# Patient Record
Sex: Male | Born: 1955 | Race: Black or African American | Hispanic: No | Marital: Married | Smoking: Never smoker
Health system: Southern US, Community
[De-identification: ages and names within clinical notes are randomized; demographics above are authoritative.]

## PROBLEM LIST (undated history)

## (undated) DIAGNOSIS — K219 Gastro-esophageal reflux disease without esophagitis: Secondary | ICD-10-CM

## (undated) DIAGNOSIS — T7840XA Allergy, unspecified, initial encounter: Secondary | ICD-10-CM

## (undated) DIAGNOSIS — F419 Anxiety disorder, unspecified: Secondary | ICD-10-CM

## (undated) DIAGNOSIS — N138 Other obstructive and reflux uropathy: Secondary | ICD-10-CM

## (undated) DIAGNOSIS — M545 Low back pain, unspecified: Secondary | ICD-10-CM

## (undated) DIAGNOSIS — R6882 Decreased libido: Secondary | ICD-10-CM

## (undated) DIAGNOSIS — F0390 Unspecified dementia without behavioral disturbance: Secondary | ICD-10-CM

## (undated) DIAGNOSIS — H5462 Unqualified visual loss, left eye, normal vision right eye: Secondary | ICD-10-CM

## (undated) DIAGNOSIS — N401 Enlarged prostate with lower urinary tract symptoms: Secondary | ICD-10-CM

## (undated) DIAGNOSIS — Z8701 Personal history of pneumonia (recurrent): Secondary | ICD-10-CM

## (undated) DIAGNOSIS — I10 Essential (primary) hypertension: Secondary | ICD-10-CM

## (undated) DIAGNOSIS — N529 Male erectile dysfunction, unspecified: Secondary | ICD-10-CM

## (undated) HISTORY — DX: Male erectile dysfunction, unspecified: N52.9

## (undated) HISTORY — DX: Decreased libido: R68.82

## (undated) HISTORY — DX: Unspecified dementia, unspecified severity, without behavioral disturbance, psychotic disturbance, mood disturbance, and anxiety: F03.90

## (undated) HISTORY — DX: Benign prostatic hyperplasia with lower urinary tract symptoms: N40.1

## (undated) HISTORY — DX: Personal history of pneumonia (recurrent): Z87.01

## (undated) HISTORY — DX: Allergy, unspecified, initial encounter: T78.40XA

## (undated) HISTORY — DX: Other obstructive and reflux uropathy: N13.8

## (undated) HISTORY — DX: Low back pain, unspecified: M54.50

## (undated) HISTORY — DX: Unqualified visual loss, left eye, normal vision right eye: H54.62

## (undated) HISTORY — DX: Essential (primary) hypertension: I10

## (undated) HISTORY — PX: JOINT REPLACEMENT: SHX530

## (undated) HISTORY — DX: Low back pain: M54.5

---

## 2004-07-06 ENCOUNTER — Ambulatory Visit: Payer: Self-pay

## 2008-11-04 ENCOUNTER — Ambulatory Visit: Payer: Self-pay | Admitting: Otolaryngology

## 2009-05-13 ENCOUNTER — Ambulatory Visit: Payer: Self-pay | Admitting: Family Medicine

## 2011-04-12 ENCOUNTER — Ambulatory Visit: Payer: Self-pay

## 2011-10-09 ENCOUNTER — Ambulatory Visit: Payer: Self-pay | Admitting: Family Medicine

## 2011-12-22 ENCOUNTER — Ambulatory Visit: Payer: Self-pay | Admitting: Family Medicine

## 2012-11-04 ENCOUNTER — Ambulatory Visit: Payer: Self-pay | Admitting: Family Medicine

## 2013-09-24 ENCOUNTER — Ambulatory Visit (INDEPENDENT_AMBULATORY_CARE_PROVIDER_SITE_OTHER): Payer: BC Managed Care – PPO

## 2013-09-24 ENCOUNTER — Ambulatory Visit (INDEPENDENT_AMBULATORY_CARE_PROVIDER_SITE_OTHER): Payer: BC Managed Care – PPO | Admitting: Podiatry

## 2013-09-24 ENCOUNTER — Encounter: Payer: Self-pay | Admitting: Podiatry

## 2013-09-24 VITALS — BP 140/90 | HR 60 | Resp 16 | Ht 72.0 in | Wt 192.0 lb

## 2013-09-24 DIAGNOSIS — M722 Plantar fascial fibromatosis: Secondary | ICD-10-CM

## 2013-09-24 DIAGNOSIS — Q828 Other specified congenital malformations of skin: Secondary | ICD-10-CM

## 2013-09-24 NOTE — Progress Notes (Signed)
Right plantar heel pain for several months, it started out in the hip.  Objective: Vital signs are stable he is alert and oriented x3. Pulses to the right foot are intact. He has pain on palpation medial continued tubercle of the right heel. He also has pain on direct palpation of the porokeratotic lesion centrally located in the plantar aspect of the right heel. Does not demonstrate any erythema edema cellulitis drainage or odor no signs of infection.  Assessment: Plantar fasciitis right foot. Porokeratosis plantar aspect right foot.  Plan: Injected the right heel today with Kenalog and local anesthetic. Debridement porokeratosis and applied salicylic acid under occlusion for 3 days. He was given instructions to washes off in 3 days and I will followup with him in one month

## 2013-09-24 NOTE — Patient Instructions (Signed)
Plantar Fasciitis (Heel Spur Syndrome) with Rehab The plantar fascia is a fibrous, ligament-like, soft-tissue structure that spans the bottom of the foot. Plantar fasciitis is a condition that causes pain in the foot due to inflammation of the tissue. SYMPTOMS   Pain and tenderness on the underneath side of the foot.  Pain that worsens with standing or walking. CAUSES  Plantar fasciitis is caused by irritation and injury to the plantar fascia on the underneath side of the foot. Common mechanisms of injury include:  Direct trauma to bottom of the foot.  Damage to a small nerve that runs under the foot where the main fascia attaches to the heel bone.  Stress placed on the plantar fascia due to bone spurs. RISK INCREASES WITH:   Activities that place stress on the plantar fascia (running, jumping, pivoting, or cutting).  Poor strength and flexibility.  Improperly fitted shoes.  Tight calf muscles.  Flat feet.  Failure to warm-up properly before activity.  Obesity. PREVENTION  Warm up and stretch properly before activity.  Allow for adequate recovery between workouts.  Maintain physical fitness:  Strength, flexibility, and endurance.  Cardiovascular fitness.  Maintain a health body weight.  Avoid stress on the plantar fascia.  Wear properly fitted shoes, including arch supports for individuals who have flat feet. PROGNOSIS  If treated properly, then the symptoms of plantar fasciitis usually resolve without surgery. However, occasionally surgery is necessary. RELATED COMPLICATIONS   Recurrent symptoms that may result in a chronic condition.  Problems of the lower back that are caused by compensating for the injury, such as limping.  Pain or weakness of the foot during push-off following surgery.  Chronic inflammation, scarring, and partial or complete fascia tear, occurring more often from repeated injections. TREATMENT  Treatment initially involves the use of  ice and medication to help reduce pain and inflammation. The use of strengthening and stretching exercises may help reduce pain with activity, especially stretches of the Achilles tendon. These exercises may be performed at home or with a therapist. Your caregiver may recommend that you use heel cups of arch supports to help reduce stress on the plantar fascia. Occasionally, corticosteroid injections are given to reduce inflammation. If symptoms persist for greater than 6 months despite non-surgical (conservative), then surgery may be recommended.  MEDICATION   If pain medication is necessary, then nonsteroidal anti-inflammatory medications, such as aspirin and ibuprofen, or other minor pain relievers, such as acetaminophen, are often recommended.  Do not take pain medication within 7 days before surgery.  Prescription pain relievers may be given if deemed necessary by your caregiver. Use only as directed and only as much as you need.  Corticosteroid injections may be given by your caregiver. These injections should be reserved for the most serious cases, because they may only be given a certain number of times. HEAT AND COLD  Cold treatment (icing) relieves pain and reduces inflammation. Cold treatment should be applied for 10 to 15 minutes every 2 to 3 hours for inflammation and pain and immediately after any activity that aggravates your symptoms. Use ice packs or massage the area with a piece of ice (ice massage).  Heat treatment may be used prior to performing the stretching and strengthening activities prescribed by your caregiver, physical therapist, or athletic trainer. Use a heat pack or soak the injury in warm water. SEEK IMMEDIATE MEDICAL CARE IF:  Treatment seems to offer no benefit, or the condition worsens.  Any medications produce adverse side effects. EXERCISES RANGE   OF MOTION (ROM) AND STRETCHING EXERCISES - Plantar Fasciitis (Heel Spur Syndrome) These exercises may help you  when beginning to rehabilitate your injury. Your symptoms may resolve with or without further involvement from your physician, physical therapist or athletic trainer. While completing these exercises, remember:   Restoring tissue flexibility helps normal motion to return to the joints. This allows healthier, less painful movement and activity.  An effective stretch should be held for at least 30 seconds.  A stretch should never be painful. You should only feel a gentle lengthening or release in the stretched tissue. RANGE OF MOTION - Toe Extension, Flexion  Sit with your right / left leg crossed over your opposite knee.  Grasp your toes and gently pull them back toward the top of your foot. You should feel a stretch on the bottom of your toes and/or foot.  Hold this stretch for __________ seconds.  Now, gently pull your toes toward the bottom of your foot. You should feel a stretch on the top of your toes and or foot.  Hold this stretch for __________ seconds. Repeat __________ times. Complete this stretch __________ times per day.  RANGE OF MOTION - Ankle Dorsiflexion, Active Assisted  Remove shoes and sit on a chair that is preferably not on a carpeted surface.  Place right / left foot under knee. Extend your opposite leg for support.  Keeping your heel down, slide your right / left foot back toward the chair until you feel a stretch at your ankle or calf. If you do not feel a stretch, slide your bottom forward to the edge of the chair, while still keeping your heel down.  Hold this stretch for __________ seconds. Repeat __________ times. Complete this stretch __________ times per day.  STRETCH - Gastroc, Standing  Place hands on wall.  Extend right / left leg, keeping the front knee somewhat bent.  Slightly point your toes inward on your back foot.  Keeping your right / left heel on the floor and your knee straight, shift your weight toward the wall, not allowing your back to  arch.  You should feel a gentle stretch in the right / left calf. Hold this position for __________ seconds. Repeat __________ times. Complete this stretch __________ times per day. STRETCH - Soleus, Standing  Place hands on wall.  Extend right / left leg, keeping the other knee somewhat bent.  Slightly point your toes inward on your back foot.  Keep your right / left heel on the floor, bend your back knee, and slightly shift your weight over the back leg so that you feel a gentle stretch deep in your back calf.  Hold this position for __________ seconds. Repeat __________ times. Complete this stretch __________ times per day. STRETCH - Gastrocsoleus, Standing  Note: This exercise can place a lot of stress on your foot and ankle. Please complete this exercise only if specifically instructed by your caregiver.   Place the ball of your right / left foot on a step, keeping your other foot firmly on the same step.  Hold on to the wall or a rail for balance.  Slowly lift your other foot, allowing your body weight to press your heel down over the edge of the step.  You should feel a stretch in your right / left calf.  Hold this position for __________ seconds.  Repeat this exercise with a slight bend in your right / left knee. Repeat __________ times. Complete this stretch __________ times per day.    STRENGTHENING EXERCISES - Plantar Fasciitis (Heel Spur Syndrome)  These exercises may help you when beginning to rehabilitate your injury. They may resolve your symptoms with or without further involvement from your physician, physical therapist or athletic trainer. While completing these exercises, remember:   Muscles can gain both the endurance and the strength needed for everyday activities through controlled exercises.  Complete these exercises as instructed by your physician, physical therapist or athletic trainer. Progress the resistance and repetitions only as guided. STRENGTH -  Towel Curls  Sit in a chair positioned on a non-carpeted surface.  Place your foot on a towel, keeping your heel on the floor.  Pull the towel toward your heel by only curling your toes. Keep your heel on the floor.  If instructed by your physician, physical therapist or athletic trainer, add ____________________ at the end of the towel. Repeat __________ times. Complete this exercise __________ times per day. STRENGTH - Ankle Inversion  Secure one end of a rubber exercise band/tubing to a fixed object (table, pole). Loop the other end around your foot just before your toes.  Place your fists between your knees. This will focus your strengthening at your ankle.  Slowly, pull your big toe up and in, making sure the band/tubing is positioned to resist the entire motion.  Hold this position for __________ seconds.  Have your muscles resist the band/tubing as it slowly pulls your foot back to the starting position. Repeat __________ times. Complete this exercises __________ times per day.  Document Released: 02/27/2005 Document Revised: 05/22/2011 Document Reviewed: 06/11/2008 [employer] Patient Information 2015 [employer]. This information is not intended to replace advice given to you by your health care provider. Make sure you discuss any questions you have with your health care provider.  

## 2013-10-22 ENCOUNTER — Ambulatory Visit: Payer: BC Managed Care – PPO | Admitting: Podiatry

## 2013-12-01 ENCOUNTER — Ambulatory Visit: Payer: Self-pay | Admitting: Specialist

## 2013-12-01 LAB — CBC
HCT: 42.7 % (ref 40.0–52.0)
HGB: 13.7 g/dL (ref 13.0–18.0)
MCH: 30 pg (ref 26.0–34.0)
MCHC: 32.2 g/dL (ref 32.0–36.0)
MCV: 93 fL (ref 80–100)
PLATELETS: 215 10*3/uL (ref 150–440)
RBC: 4.58 10*6/uL (ref 4.40–5.90)
RDW: 13.4 % (ref 11.5–14.5)
WBC: 7.2 10*3/uL (ref 3.8–10.6)

## 2013-12-01 LAB — URINALYSIS, COMPLETE
BACTERIA: NONE SEEN
BLOOD: NEGATIVE
Bilirubin,UR: NEGATIVE
Glucose,UR: NEGATIVE mg/dL (ref 0–75)
KETONE: NEGATIVE
Leukocyte Esterase: NEGATIVE
NITRITE: NEGATIVE
Ph: 7 (ref 4.5–8.0)
Protein: NEGATIVE
RBC,UR: 1 /HPF (ref 0–5)
SQUAMOUS EPITHELIAL: NONE SEEN
Specific Gravity: 1.005 (ref 1.003–1.030)
WBC UR: NONE SEEN /HPF (ref 0–5)

## 2013-12-01 LAB — BASIC METABOLIC PANEL
ANION GAP: 8 (ref 7–16)
BUN: 14 mg/dL (ref 7–18)
CHLORIDE: 105 mmol/L (ref 98–107)
CREATININE: 0.84 mg/dL (ref 0.60–1.30)
Calcium, Total: 8.6 mg/dL (ref 8.5–10.1)
Co2: 26 mmol/L (ref 21–32)
Glucose: 89 mg/dL (ref 65–99)
Osmolality: 277 (ref 275–301)
Potassium: 4 mmol/L (ref 3.5–5.1)
SODIUM: 139 mmol/L (ref 136–145)

## 2013-12-01 LAB — MRSA PCR SCREENING

## 2013-12-01 LAB — PROTIME-INR
INR: 1
PROTHROMBIN TIME: 12.6 s (ref 11.5–14.7)

## 2013-12-17 ENCOUNTER — Inpatient Hospital Stay: Payer: Self-pay | Admitting: Specialist

## 2013-12-18 LAB — BASIC METABOLIC PANEL
ANION GAP: 4 — AB (ref 7–16)
BUN: 9 mg/dL (ref 7–18)
CHLORIDE: 104 mmol/L (ref 98–107)
Calcium, Total: 7.8 mg/dL — ABNORMAL LOW (ref 8.5–10.1)
Co2: 30 mmol/L (ref 21–32)
Creatinine: 0.85 mg/dL (ref 0.60–1.30)
EGFR (African American): >60
EGFR (Non-African Amer.): >60
Glucose: 94 mg/dL (ref 65–99)
Osmolality: 274 (ref 275–301)
POTASSIUM: 4.2 mmol/L (ref 3.5–5.1)
Sodium: 138 mmol/L (ref 136–145)

## 2013-12-18 LAB — CBC WITH DIFFERENTIAL/PLATELET
Basophil #: 0 10*3/uL (ref 0.0–0.1)
Basophil %: 0.2 %
EOS PCT: 3.5 %
Eosinophil #: 0.4 10*3/uL (ref 0.0–0.7)
HCT: 37.8 % — ABNORMAL LOW (ref 40.0–52.0)
HGB: 12.1 g/dL — ABNORMAL LOW (ref 13.0–18.0)
Lymphocyte #: 1.7 10*3/uL (ref 1.0–3.6)
Lymphocyte %: 15.9 %
MCH: 30.1 pg (ref 26.0–34.0)
MCHC: 32 g/dL (ref 32.0–36.0)
MCV: 94 fL (ref 80–100)
Monocyte #: 0.9 x10 3/mm (ref 0.2–1.0)
Monocyte %: 8.6 %
NEUTROS PCT: 71.8 %
Neutrophil #: 7.5 10*3/uL — ABNORMAL HIGH (ref 1.4–6.5)
Platelet: 167 10*3/uL (ref 150–440)
RBC: 4.03 10*6/uL — ABNORMAL LOW (ref 4.40–5.90)
RDW: 13.5 % (ref 11.5–14.5)
WBC: 10.5 10*3/uL (ref 3.8–10.6)

## 2013-12-19 LAB — PATHOLOGY REPORT

## 2013-12-19 LAB — HEMOGLOBIN: HGB: 11.7 g/dL — ABNORMAL LOW (ref 13.0–18.0)

## 2014-07-04 NOTE — Op Note (Signed)
PATIENT NAME:  Eric Rose, Eric Rose MR#:  [id number] DATE OF BIRTH:  [birth year]  DATE OF PROCEDURE:  12/17/2013  PREOPERATIVE DIAGNOSIS: Advanced osteoarthritis, right hip.   POSTOPERATIVE DIAGNOSIS: Advanced osteoarthritis, right hip.   PROCEDURE PERFORMED: Cementless AML right total hip replacement (18 mm femoral stem, 58 mm series 300 cup, 32 mm liner with a +10 mm build-up, 32 mm head/+5 mm neck length).   SURGEON: Park Breed, MD    ANESTHESIA: Spinal.   COMPLICATIONS: None.   DRAINS: Two Autovac drains.   ESTIMATED BLOOD LOSS: 100 mL; replaced none.   DESCRIPTION OF PROCEDURE: The patient was brought to the operating room where he underwent satisfactory spinal anesthesia and was placed in the left lateral decubitus position and padded appropriately. The right hip was prepped and draped in a sterile fashion, and the posterolateral incision made. Dissection was carried out sharply through the subcutaneous tissue and fascia. The Charnley retractor was inserted. The short external rotators were divided and tagged, and the capsule was opened in a "T" fashion. Electrocautery was used for hemostasis.   The femoral head was dislocated and amputated with an oscillating saw. The retractors were inserted and the labrum was excised. The femoral head was severely arthritic, as well as acetabulum. The acetabulum was reamed sequentially from 52 mm to 57 mm. A trial acetabular cup liner was inserted and fit very snugly; this was removed, and a series 300, 58 mm acetabular component was inserted in about 45 degrees of abduction and 25 degrees of anteversion. The trial liner was inserted with a 10 mm build-up posterosuperiorly. The femoral canal was then opened and sequentially reamed to 17.5 mm. A rasper sequentially inserted up to 18 mm, which was quite snug. The calcar was reamed smooth. Trial reduction was carried out with a +1,  36 mm head and a +1.5 mm neck length. This was reduced and showed good  length and good  stability.   The trials were removed and a hole eliminator placed in the acetabular component. A 58 mm cup for a 32 mm head was inserted with a 10 mm build-up posterosuperiorly. The 18 mm AML standard stem was inserted and was quite tight. A trial reduction was carried out, and initially a 36 ball was used. This was changed to a 32 mm ball, and a +5 neck length seemed to provide the best stability and showed good range of motion with excellent stability and leg lengths. The trial head was removed and a 32 mm, +5 head was inserted. This was reduced and showed good length and excellent stability.   The wound was then irrigated. The capsule was closed with #1 Tycron suture. The short external rotators were repaired with #1 Tycron. The Autovac were inserted. The fascia was closed with some #2 Quill, and the subcutaneous tissue was closed over another Autovac drain was 0 Quill. The skin was closed with staples.   TENS pads and an Aquacel dressing were applied. The drain was taped in place and activated. The patient was transferred to his hospital bed. The hip was stable. Leg lengths were excellent. He was transferred to the recovery room in good condition.    ____________________________ Park Breed, MD hem:MT D: 12/17/2013 10:43:07 ET T: 12/17/2013 11:37:06 ET JOB#: [id number]  cc: Park Breed, MD, <Dictator> Park Breed MD ELECTRONICALLY SIGNED 12/18/2013 12:46

## 2014-07-04 NOTE — H&P (Signed)
   Subjective/Chief Complaint Right hip pain   History of Present Illness 59 year old male presents with servere right hip pain due to osteoarthritis. He has had extensive treatment with NSAIDs, exercise and rest without relirf.  He finds that he cannot do his job or perforn any daily activities without significant pain.  He wishes to proceed with right total hip replacement.  Risks and benefits of surgery were discussed at length including but not limited to infection, non union, nerve or blood vessed damage, non union, need for repeat surgery, blood clots and lung emboli, and death. X-rays show advanced osteoarthritis with complete loss of joint space, sclerosis, and multiple cysts.   Primary Physician [facility]   Code Status Full Code   Past Med/Surgical Hx:  hypertention:   ALLERGIES:  No Known Allergies:   HOME MEDICATIONS: Medication Instructions Status  aspirin 81 mg oral tablet 1 tab(s) orally once a day Active  Ultram 50 mg oral tablet 1 tab(s) orally every 6 hours, As Needed Active  Voltaren Topical 1% topical gel Apply topically to affected area 3 times a day Active  lisinopril 20 mg oral tablet 1 tab(s) orally once a day Active  indomethacin 50 mg oral capsule 1 cap(s) orally 3 times a day Active  multivitamin 1 tab(s) orally once a day Active  Glucosamine & Chondroitin with MSM 400 mg-500 mg-250 mg oral tablet 1 tab(s) orally once a day, As Needed Active  vitamin d 1000 milligram(s) orally once a day Active  Fish Oil 300 milligram(s) orally once a day Active  Tylenol 500 mg oral tablet 1 tab(s) orally 1 to 3 times a day, As Needed Active   Family and Social History:  Family History Non-Contributory   Social History negative tobacco   Place of Living Home   Review of Systems:  Fever/Chills No   Cough No   Sputum No   Abdominal Pain No   Diarrhea No   Chest Pain No   Physical Exam:  GEN well developed, well nourished, no acute distress   HEENT pink  conjunctivae   NECK supple   RESP normal resp effort   CARD regular rate   ABD denies tenderness   LYMPH negative neck   EXTR negative edema, Pain with range of motion.  internal rotation -5* and Ext rotation 20*.  Flexion 80*.  circulation/sensation/motor function good.  Leg lengths equal.   SKIN normal to palpation   NEURO motor/sensory function intact   PSYCH alert, A+O to time, place, person, good insight    Assessment/Admission Diagnosis Advanced osteoarthritis right hip   Plan Right total hip replacement   Electronic Signatures: Park Breed (MD)  (Signed 06-Oct-15 14:56)  Authored: CHIEF COMPLAINT and HISTORY, PAST MEDICAL/SURGIAL HISTORY, ALLERGIES, HOME MEDICATIONS, FAMILY AND SOCIAL HISTORY, REVIEW OF SYSTEMS, PHYSICAL EXAM, ASSESSMENT AND PLAN   Last Updated: 06-Oct-15 14:56 by Park Breed (MD)

## 2014-07-04 NOTE — Discharge Summary (Signed)
PATIENT NAME:  Eric Rose, Eric Rose MR#:  [id number] DATE OF BIRTH:  01-29-1956  DATE OF ADMISSION:  12/17/2013 DATE OF DISCHARGE:    FINAL DIAGNOSES:   1.  Advanced osteoarthritis right hip.   2.  Hypertension.   OPERATION: 12/17/2013 DePuy AML cementless right total hip replacement.   COMPLICATIONS: None.   CONSULTATIONS: None.   DISCHARGE MEDICATIONS:  1. Enteric-coated aspirin 1 p.o. b.i.d. for 6 weeks.   2. Mobic 15 mg daily.   3. Neurontin 400 mg b.i.d.   4. Norco 5/325 q. 4-6 h. p.r.n. pain.  5. Home medications as prior to admission.   HISTORY: The patient is a 58 year old male who has had progressive arthritis of the right hip for many years. He has been treated with nonsteroidal anti-inflammatory drugs, rest, and exercise without relief. X-rays revealed complete loss of joint space with severe sclerosis and multiple cyst formation present. The patient was admitted for right total hip replacement. The risks and benefits were discussed with him at length.   PAST MEDICAL HISTORY: Illnesses as above.   MEDICATIONS:  Lisinopril and 81 mg aspirin.    REVIEW OF SYSTEMS: Unremarkable.   FAMILY HISTORY: Unremarkable.   ALLERGIES: None.   OPERATIONS: None listed.     FAMILY DOCTOR:   Dr. Ashok Norris.    SOCIAL HISTORY: The patient does not smoke. Lives at home.   REVIEW OF SYSTEMS: Unremarkable.   PHYSICAL EXAMINATION: The patient was alert and cooperative. The right hip showed flexion to 80 degrees, he lacked 5 degrees of neutral in internal rotation and externally rotated to 20 degrees. Leg lengths were essentially equal. He had severe pain with motion of the hip. Neurovascular status was good distally.   Laboratory data on admission was satisfactory.   HOSPITAL COURSE: On 12/17/2013 the patient underwent cementless AML right total hip replacement. Postoperatively he did well. Hemoglobin was 11.7 on the second postoperative day. He was ambulatory with PT and the wound  was benign when the dressing was changed. He is to be discharged 12/20/2013 to home. He will get home PT. He will remain partial weight-bearing on the right hip and return to my office in 2 weeks for exam and x-ray of the right hip. He will remain partial weightbearing and take enteric-coated aspirin b.i.d.    ____________________________ Park Breed, MD hem:bu D: 12/19/2013 13:00:00 ET T: 12/19/2013 14:38:01 ET JOB#: [id number]  cc: Ashok Norris, MD Park Breed, MD, <Dictator>  Park Breed MD ELECTRONICALLY SIGNED 12/19/2013 17:45

## 2014-12-15 ENCOUNTER — Ambulatory Visit (INDEPENDENT_AMBULATORY_CARE_PROVIDER_SITE_OTHER): Payer: BLUE CROSS/BLUE SHIELD | Admitting: Family Medicine

## 2014-12-15 ENCOUNTER — Encounter: Payer: Self-pay | Admitting: Family Medicine

## 2014-12-15 VITALS — BP 130/72 | HR 101 | Temp 98.2°F | Resp 16 | Ht 70.0 in | Wt 193.4 lb

## 2014-12-15 DIAGNOSIS — N528 Other male erectile dysfunction: Secondary | ICD-10-CM

## 2014-12-15 DIAGNOSIS — M159 Polyosteoarthritis, unspecified: Secondary | ICD-10-CM

## 2014-12-15 DIAGNOSIS — N400 Enlarged prostate without lower urinary tract symptoms: Secondary | ICD-10-CM | POA: Diagnosis not present

## 2014-12-15 DIAGNOSIS — M150 Primary generalized (osteo)arthritis: Secondary | ICD-10-CM

## 2014-12-15 DIAGNOSIS — I10 Essential (primary) hypertension: Secondary | ICD-10-CM | POA: Diagnosis not present

## 2014-12-15 MED ORDER — TADALAFIL 20 MG PO TABS: 20.0000 mg | ORAL_TABLET | Freq: Every day | ORAL | Status: DC | PRN

## 2014-12-15 NOTE — Progress Notes (Signed)
Name: Eric Rose   MRN: [id number]    DOB: 14-Nov-1955   Date:12/15/2014       Progress Note  Subjective  Chief Complaint  Chief Complaint  Patient presents with  . Hip Pain  . Hypertension    HPI  Hypertension   Patient presents for follow-up of hypertension. It has been present for over over 5 years.  Patient states that there is compliance with medical regimen which consists of lisinopril 20 mg daily . There is no end organ disease. Cardiac risk factors include hypertension hyperlipidemia and diabetes.  Exercise regimen consist of minimal walking .  Diet consist of some salt restriction .  Erectile dysfunction  Patient has a number of questions about erectile dysfunction. He has used Viagra and Cialis in the past. Is concerned about possible side effects that he has not a cardiac patient is not on nitrates and has had no significant side effects. 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 Prostatitis   . Hypertension   . Lumbago   . Decreased libido   . Allergy     Social History  Substance Use Topics  . Smoking status: Never Smoker   . Smokeless tobacco: Never Used  . Alcohol Use: Not on file     Current outpatient prescriptions:  .  aspirin 81 MG tablet, Take 81 mg by mouth daily., Disp: , Rfl:  .  diclofenac sodium (VOLTAREN) 1 % GEL, Apply topically 3 (three) times daily., Disp: , Rfl:  .  lisinopril (PRINIVIL,ZESTRIL) 20 MG tablet, Take 20 mg by mouth daily., Disp: , Rfl:  .  ciprofloxacin (CIPRO) 500 MG tablet, Take 500 mg by mouth 2 (two) times daily., Disp: , Rfl:  .  tadalafil (CIALIS) 20 MG tablet, Take 1 tablet (20 mg total) by mouth daily as needed for erectile dysfunction., Disp: 10 tablet, Rfl: 11  No Known  Allergies  Review of Systems  Constitutional: Negative for fever, chills and weight loss.  HENT: Negative for congestion, hearing loss, sore throat and tinnitus.   Eyes: Negative for blurred vision, double vision and redness.  Respiratory: Negative for cough, hemoptysis and shortness of breath.   Cardiovascular: Negative for chest pain, palpitations, orthopnea, claudication and leg swelling.  Gastrointestinal: Negative for heartburn, nausea, vomiting, diarrhea, constipation and blood in stool.  Genitourinary: Positive for urgency and frequency. Negative for dysuria and hematuria.       Erectile dysfunction  Musculoskeletal: Positive for joint pain. Negative for myalgias, back pain, falls and neck pain.  Skin: Negative for itching.  Neurological: Negative for dizziness, tingling, tremors, focal weakness, seizures, loss of consciousness, weakness and headaches.  Endo/Heme/Allergies: Does not bruise/bleed easily.  Psychiatric/Behavioral: Negative for depression and substance abuse. The patient is nervous/anxious. The patient does not have insomnia.      Objective  Filed Vitals:   12/15/14 1501  BP: 130/72  Pulse: 101  Temp: 98.2 F (36.8 C)  Resp: 16  Height: 5\' 10"  (1.778 m)  Weight: 193 lb 7 oz (87.743 kg)  SpO2: 95%     Physical Exam  Constitutional: He is oriented to person, place, and time and well-developed, well-nourished, and in no distress.  HENT:  Head: Normocephalic.  Eyes: EOM are normal. Pupils are equal, round,  and reactive to light.  Neck: Normal range of motion. Neck supple. No thyromegaly present.  Cardiovascular: Normal rate, regular rhythm and normal heart sounds.   No murmur heard. Pulmonary/Chest: Effort normal and breath sounds normal. No respiratory distress. He has no wheezes.  Abdominal: Soft. Bowel sounds are normal.  Musculoskeletal: Normal range of motion. He exhibits no edema.  Lymphadenopathy:    He has no cervical adenopathy.  Neurological:  He is alert and oriented to person, place, and time. No cranial nerve deficit. Gait normal. Coordination normal.  Skin: Skin is warm and dry. No rash noted.  Psychiatric: Affect and judgment normal.      Assessment & Plan  1. Essential hypertension Well-controlled - Comprehensive Metabolic Panel (CMET) - TSH  2. Primary osteoarthritis involving multiple joints Continue NSAIDs and recommendations for his orthopedist  3. BPH (benign prostatic hyperplasia) Flomax offered not needed today by his physician - PSA  4. Other male erectile dysfunction Followed Viagra again

## 2014-12-25 ENCOUNTER — Telehealth: Payer: Self-pay | Admitting: Family Medicine

## 2014-12-25 NOTE — Telephone Encounter (Signed)
Lost his lab slip and would like to pick up a new one Monday

## 2014-12-28 NOTE — Telephone Encounter (Signed)
Printed and placed in file cabinet for pick up

## 2014-12-29 NOTE — Telephone Encounter (Signed)
Called to inform patient that lab slip was ready, no response and voice mail not set up.

## 2014-12-30 ENCOUNTER — Telehealth: Payer: Self-pay | Admitting: Emergency Medicine

## 2014-12-30 DIAGNOSIS — R972 Elevated prostate specific antigen [PSA]: Secondary | ICD-10-CM

## 2014-12-30 LAB — COMPREHENSIVE METABOLIC PANEL
A/G RATIO: 1.8 (ref 1.1–2.5)
ALK PHOS: 96 IU/L (ref 39–117)
ALT: 26 IU/L (ref 0–44)
AST: 29 IU/L (ref 0–40)
Albumin: 4.2 g/dL (ref 3.6–4.8)
BUN/Creatinine Ratio: 12 (ref 10–22)
BUN: 11 mg/dL (ref 8–27)
Bilirubin Total: 0.4 mg/dL (ref 0.0–1.2)
CO2: 25 mmol/L (ref 18–29)
Calcium: 9.2 mg/dL (ref 8.6–10.2)
Chloride: 100 mmol/L (ref 97–106)
Creatinine, Ser: 0.9 mg/dL (ref 0.76–1.27)
GFR calc Af Amer: 106 mL/min/{1.73_m2} (ref >59)
GFR calc non Af Amer: 92 mL/min/{1.73_m2} (ref >59)
GLOBULIN, TOTAL: 2.3 g/dL (ref 1.5–4.5)
Glucose: 89 mg/dL (ref 65–99)
Potassium: 4.3 mmol/L (ref 3.5–5.2)
SODIUM: 139 mmol/L (ref 136–144)
Total Protein: 6.5 g/dL (ref 6.0–8.5)

## 2014-12-30 LAB — PSA: Prostate Specific Ag, Serum: 4.8 ng/mL — ABNORMAL HIGH (ref 0.0–4.0)

## 2014-12-30 LAB — TSH: TSH: 2.3 u[IU]/mL (ref 0.450–4.500)

## 2014-12-30 NOTE — Telephone Encounter (Signed)
Patient notified of lab results. Referral sent

## 2015-01-12 ENCOUNTER — Ambulatory Visit (INDEPENDENT_AMBULATORY_CARE_PROVIDER_SITE_OTHER): Payer: BLUE CROSS/BLUE SHIELD | Admitting: Urology

## 2015-01-12 ENCOUNTER — Encounter: Payer: Self-pay | Admitting: Urology

## 2015-01-12 VITALS — BP 145/88 | HR 84 | Resp 16 | Ht 72.0 in | Wt 190.7 lb

## 2015-01-12 DIAGNOSIS — N401 Enlarged prostate with lower urinary tract symptoms: Secondary | ICD-10-CM | POA: Diagnosis not present

## 2015-01-12 DIAGNOSIS — R972 Elevated prostate specific antigen [PSA]: Secondary | ICD-10-CM | POA: Diagnosis not present

## 2015-01-12 DIAGNOSIS — N138 Other obstructive and reflux uropathy: Secondary | ICD-10-CM | POA: Insufficient documentation

## 2015-01-12 MED ORDER — DUTASTERIDE-TAMSULOSIN HCL 0.5-0.4 MG PO CAPS: 1.0000 | ORAL_CAPSULE | Freq: Every day | ORAL | Status: DC

## 2015-01-12 NOTE — Progress Notes (Signed)
01/12/2015 3:44 PM   Eric Rose 1955-10-13 [id number]  Referring provider: Ashok Norris, MD [address] [place], [place] [address]  Chief Complaint  Patient presents with  . Elevated PSA    HPI: Patient is 59 year old African-American male who was referred to [place] by his primary care physician, Dr. Lucita Lora, for an elevated PSA of 4.8 on 12/29/2014.    Patient had not been seen in our office for the last 2 years.  At that time, he was having difficulty with erections and BPH with LUTS.  His PSA at that time was 4.0.  He was started on Jalyn and a follow-up PSA was 2.0.   He has not had another PSA until recently.  He is complaining of urinary frequency and urgency.  This has been worsening over the last year. He denies any dysuria, gross hematuria or suprapubic pain.  He has not had any recent fevers, chills, nausea or vomiting.  He does not have a family history of prostate cancer.  His I PSS score today is 11/3.        IPSS      01/12/15 1500       International Prostate Symptom Score   How often have you had the sensation of not emptying your bladder? Less than half the time     How often have you had to urinate less than every two hours? Less than half the time     How often have you found you stopped and started again several times when you urinated? Less than 1 in 5 times     How often have you found it difficult to postpone urination? About half the time     How often have you had a weak urinary stream? Less than 1 in 5 times     How often have you had to strain to start urination? Not at All     How many times did you typically get up at night to urinate? 2 Times     Total IPSS Score 11     Quality of Life due to urinary symptoms   If you were to spend the rest of your life with your urinary condition just the way it is now how would you feel about that? Mixed        Score:  1-7 Mild 8-19 Moderate 20-35 Severe   PMH: Past Medical  History  Diagnosis Date  . Prostatitis   . Hypertension   . Lumbago   . Decreased libido   . Allergy     Surgical History: No past surgical history on file.  Home Medications:    Medication List       This list is accurate as of: 01/12/15  3:44 PM.  Always use your most recent med list.               aspirin 81 MG tablet  Take 81 mg by mouth daily.     Dutasteride-Tamsulosin HCl 0.5-0.4 MG Caps  Take 1 capsule by mouth daily.     lisinopril 20 MG tablet  Commonly known as:  PRINIVIL,ZESTRIL  Take 20 mg by mouth daily.     tadalafil 20 MG tablet  Commonly known as:  CIALIS  Take 1 tablet (20 mg total) by mouth daily as needed for erectile dysfunction.        Allergies: No Known Allergies  Family History: Family History  Problem Relation Age of Onset  . Diabetes Mother   .  Heart disease Mother   . Alzheimer's disease Mother     Social History:  reports that he has never smoked. He has never used smokeless tobacco. He reports that he does not drink alcohol or use illicit drugs.  ROS: UROLOGY Frequent Urination?: Yes Hard to postpone urination?: Yes Burning/pain with urination?: No Get up at night to urinate?: No Leakage of urine?: No Urine stream starts and stops?: No Trouble starting stream?: No Do you have to strain to urinate?: No Blood in urine?: No Urinary tract infection?: No Sexually transmitted disease?: No Injury to kidneys or bladder?: No Painful intercourse?: No Weak stream?: No Erection problems?: Yes Penile pain?: No  Gastrointestinal Nausea?: No Vomiting?: No Indigestion/heartburn?: No Diarrhea?: No Constipation?: No  Constitutional Fever: No Night sweats?: No Weight loss?: No Fatigue?: No  Skin Skin rash/lesions?: No Itching?: No  Eyes Blurred vision?: No Double vision?: No  Ears/Nose/Throat Sore throat?: No Sinus problems?: Yes  Hematologic/Lymphatic Swollen glands?: No Easy bruising?:  No  Cardiovascular Leg swelling?: No Chest pain?: No  Respiratory Cough?: No Shortness of breath?: Yes  Endocrine Excessive thirst?: No  Musculoskeletal Back pain?: No Joint pain?: No  Neurological Headaches?: No Dizziness?: No  Psychologic Depression?: No Anxiety?: No  Physical Exam: BP 145/88 mmHg  Pulse 84  Resp 16  Ht 6' (1.829 m)  Wt 190 lb 11.2 oz (86.501 kg)  BMI 25.86 kg/m2  Constitutional: Well nourished. Alert and oriented, No acute distress. HEENT: [place] AT, moist mucus membranes. Trachea midline, no masses. Cardiovascular: No clubbing, cyanosis, or edema. Respiratory: Normal respiratory effort, no increased work of breathing. GI: Abdomen is soft, non tender, non distended, no abdominal masses. Liver and spleen not palpable.  No hernias appreciated.  Stool sample for occult testing is not indicated.   GU: No CVA tenderness.  No bladder fullness or masses.  Patient with uncircumcised phallus. Foreskin easily retracted  Urethral meatus is patent.  No penile discharge. No penile lesions or rashes. Scrotum without lesions, cysts, rashes and/or edema.  Testicles are located scrotally bilaterally. No masses are appreciated in the testicles. Left and right epididymis are normal. Rectal: Patient with  normal sphincter tone. Anus and perineum without scarring or rashes. No rectal masses are appreciated. Prostate is approximately 60 grams, no nodules are appreciated. Seminal vesicles are normal. Skin: No rashes, bruises or suspicious lesions. Lymph: No cervical or inguinal adenopathy. Neurologic: Grossly intact, no focal deficits, moving all 4 extremities. Psychiatric: Normal mood and affect.  Laboratory Data: Lab Results  Component Value Date   WBC 10.5 12/18/2013   HGB 11.7* 12/19/2013   HCT 37.8* 12/18/2013   MCV 94 12/18/2013   PLT 167 12/18/2013    Lab Results  Component Value Date   CREATININE 0.90 12/29/2014   PSA History:  4.0 ng/mL on  06/17/2012-started on Jalyn  2.0 ng/mL on 07/22/2012 Lab Results  Component Value Date   PSA 4.8* 12/29/2014      Assessment & Plan:    1. Elevated PSA:    Without Jalyn, the patient's PSA velocity would have been less than .75/year.  His exam noted benign enlargement. He is having moderate obstructive voiding symptoms, so I would like to restart the [facility] and recheck his PSA in 3 months.  - PSA  2. BPH with LUTS:   Patient's IPSS score is 11/3.    His DRE demonstrates benign enlargement.   I will restart the Jalyn.  He will follow up in 3 months for a PSA and an  IPSS score.    Return in about 3 months (around 04/14/2015) for IPSS and PSA.  Zara Council, [facility] [address], [place], [place] [address] [phone]

## 2015-01-13 ENCOUNTER — Telehealth: Payer: Self-pay

## 2015-01-13 DIAGNOSIS — R972 Elevated prostate specific antigen [PSA]: Secondary | ICD-10-CM

## 2015-01-13 LAB — PSA: Prostate Specific Ag, Serum: 3.6 ng/mL (ref 0.0–4.0)

## 2015-01-13 NOTE — Telephone Encounter (Signed)
Spoke with pt in reference to PSA. Pt voiced understanding.  

## 2015-01-13 NOTE — Telephone Encounter (Signed)
-----   Message from Nori Riis, PA-C sent at 01/13/2015  8:35 AM EDT ----- Patient's PSA has reduced.  We will see him in three months for a PSA, IPSS score and PVR.

## 2015-01-18 ENCOUNTER — Telehealth: Payer: Self-pay | Admitting: Urology

## 2015-01-18 DIAGNOSIS — N400 Enlarged prostate without lower urinary tract symptoms: Secondary | ICD-10-CM

## 2015-01-18 NOTE — Telephone Encounter (Signed)
Pt stated he started taking dutastride last week and developed severe indigestion and gas. Pt stated he quit taking medication over the weekend and not has had a problem. Please advise.

## 2015-01-18 NOTE — Telephone Encounter (Signed)
Pt called and has some questions about side effects he's having from medication that [facility] prescribed.  Please give pt a call.

## 2015-01-18 NOTE — Telephone Encounter (Signed)
I prescribed the Jalyn which is tamsulosin and dutasteride.  His indigestion as well as likely due to the tamsulosin.  I would like him just to take dutasteride for 3 months.  Please call and dutasteride 0.5 mg 1 daily to his pharmacy.

## 2015-01-22 MED ORDER — DUTASTERIDE 0.5 MG PO CAPS: 0.5000 mg | ORAL_CAPSULE | Freq: Every day | ORAL | Status: DC

## 2015-01-22 NOTE — Telephone Encounter (Signed)
Medication called into pt pharmacy  

## 2015-03-08 ENCOUNTER — Other Ambulatory Visit: Payer: Self-pay | Admitting: Family Medicine

## 2015-03-31 ENCOUNTER — Encounter
Admission: RE | Admit: 2015-03-31 | Discharge: 2015-03-31 | Disposition: A | Discharge status: Home / Self Care | Payer: BLUE CROSS/BLUE SHIELD | Source: Ambulatory Visit | Attending: Specialist | Admitting: Specialist

## 2015-03-31 DIAGNOSIS — I10 Essential (primary) hypertension: Secondary | ICD-10-CM

## 2015-03-31 DIAGNOSIS — Z01812 Encounter for preprocedural laboratory examination: Secondary | ICD-10-CM | POA: Insufficient documentation

## 2015-03-31 HISTORY — DX: Gastro-esophageal reflux disease without esophagitis: K21.9

## 2015-03-31 HISTORY — DX: Anxiety disorder, unspecified: F41.9

## 2015-03-31 LAB — BASIC METABOLIC PANEL
ANION GAP: 7 (ref 5–15)
BUN: 14 mg/dL (ref 6–20)
CHLORIDE: 104 mmol/L (ref 101–111)
CO2: 26 mmol/L (ref 22–32)
Calcium: 8.8 mg/dL — ABNORMAL LOW (ref 8.9–10.3)
Creatinine, Ser: 0.68 mg/dL (ref 0.61–1.24)
Glucose, Bld: 101 mg/dL — ABNORMAL HIGH (ref 65–99)
POTASSIUM: 4 mmol/L (ref 3.5–5.1)
SODIUM: 137 mmol/L (ref 135–145)

## 2015-03-31 LAB — CBC
HCT: 42 % (ref 40.0–52.0)
HEMOGLOBIN: 13.9 g/dL (ref 13.0–18.0)
MCH: 30.8 pg (ref 26.0–34.0)
MCHC: 33.2 g/dL (ref 32.0–36.0)
MCV: 92.9 fL (ref 80.0–100.0)
Platelets: 223 10*3/uL (ref 150–440)
RBC: 4.52 MIL/uL (ref 4.40–5.90)
RDW: 13.2 % (ref 11.5–14.5)
WBC: 7.4 10*3/uL (ref 3.8–10.6)

## 2015-03-31 LAB — URINALYSIS COMPLETE WITH MICROSCOPIC (ARMC ONLY)
BILIRUBIN URINE: NEGATIVE
Bacteria, UA: NONE SEEN
GLUCOSE, UA: NEGATIVE mg/dL
HGB URINE DIPSTICK: NEGATIVE
KETONES UR: NEGATIVE mg/dL
LEUKOCYTES UA: NEGATIVE
NITRITE: NEGATIVE
PH: 7 (ref 5.0–8.0)
Protein, ur: NEGATIVE mg/dL
SPECIFIC GRAVITY, URINE: 1.006 (ref 1.005–1.030)
Squamous Epithelial / LPF: NONE SEEN

## 2015-03-31 LAB — ABO/RH: ABO/RH(D): A POS

## 2015-03-31 LAB — SURGICAL PCR SCREEN
MRSA, PCR: NEGATIVE
STAPHYLOCOCCUS AUREUS: NEGATIVE

## 2015-03-31 LAB — TYPE AND SCREEN
ABO/RH(D): A POS
Antibody Screen: NEGATIVE

## 2015-03-31 LAB — PROTIME-INR
INR: 1.08
PROTHROMBIN TIME: 14.2 s (ref 11.4–15.0)

## 2015-03-31 NOTE — Patient Instructions (Addendum)
  Your procedure is scheduled on: Wednesday 04/14/2015 Report to Day Surgery. 2ND FLOOR MEDICAL MALL ENTRANCE To find out your arrival time please call [phone] between 1PM - 3PM on Tuesday 04/13/2015.  Remember: Instructions that are not followed completely may result in serious medical risk, up to and including death, or upon the discretion of your surgeon and anesthesiologist your surgery may need to be rescheduled.    __X__ 1. Do not eat food or drink liquids after midnight. No gum chewing or hard candies.     __X__ 2. No Alcohol for 24 hours before or after surgery.   ____ 3. Bring all medications with you on the day of surgery if instructed.    __X__ 4. Notify your doctor if there is any change in your medical condition     (cold, fever, infections).     Do not wear jewelry, make-up, hairpins, clips or nail polish.  Do not wear lotions, powders, or perfumes.   Do not shave 48 hours prior to surgery. Men may shave face and neck.  Do not bring valuables to the hospital.    [facility] is not responsible for any belongings or valuables.               Contacts, dentures or bridgework may not be worn into surgery.  Leave your suitcase in the car. After surgery it may be brought to your room.  For patients admitted to the hospital, discharge time is determined by your                treatment team.   Patients discharged the day of surgery will not be allowed to drive home.   Please read over the following fact sheets that you were given:   MRSA Information SURGICAL SITE INFECTION PREVENTION  __X__ Take these medicines the morning of surgery with A SIP OF WATER:    1. lisinopril  2.   3.   4.  5.  6.  ____ Fleet Enema (as directed)   __X__ Use CHG Soap as directed  ____ Use inhalers on the day of surgery  ____ Stop metformin 2 days prior to surgery    ____ Take 1/2 of usual insulin dose the night before surgery and none on the morning of surgery.   __X__ Stop  Coumadin/Plavix/aspirin on 04/05/2015  __x__ Stop Anti-inflammatories on (stop Diclofenac )   __x__ Stop supplements until after surgery.  Fish oil, Glucosamine  ____ Bring C-Pap to the hospital.

## 2015-04-01 ENCOUNTER — Other Ambulatory Visit: Payer: Self-pay | Admitting: Family Medicine

## 2015-04-08 ENCOUNTER — Other Ambulatory Visit: Payer: BLUE CROSS/BLUE SHIELD

## 2015-04-08 DIAGNOSIS — R972 Elevated prostate specific antigen [PSA]: Secondary | ICD-10-CM

## 2015-04-09 ENCOUNTER — Encounter: Payer: BLUE CROSS/BLUE SHIELD | Admitting: Family Medicine

## 2015-04-09 LAB — PSA: PROSTATE SPECIFIC AG, SERUM: 2.5 ng/mL (ref 0.0–4.0)

## 2015-04-13 NOTE — H&P (Signed)
TOTAL HIP ADMISSION H&P  Patient is admitted for left total hip arthroplasty.  Subjective:  Chief Complaint: left hip pain  HPI: Eric Rose, 60 y.o. male, has a history of pain and functional disability in the left hip(s) due to arthritis and patient has failed non-surgical conservative treatments for greater than 12 weeks to include NSAID's and/or analgesics, flexibility and strengthening excercises and use of assistive devices.  Onset of symptoms was gradual starting 2 years ago with gradually worsening course since that time.The patient noted no past surgery on the left hip(s).  Patient currently rates pain in the left hip at 6 out of 10 with activity. Patient has night pain, worsening of pain with activity and weight bearing, trendelenberg gait, pain that interfers with activities of daily living and pain with passive range of motion. Patient has evidence of subchondral cysts, subchondral sclerosis, periarticular osteophytes and joint space narrowing by imaging studies. This condition presents safety issues increasing the risk of falls.    There is no current active infection.  Patient Active Problem List   Diagnosis Date Noted  . Elevated PSA 01/12/2015  . BPH with obstruction/lower urinary tract symptoms 01/12/2015   Past Medical History  Diagnosis Date  . Prostatitis   . Hypertension   . Lumbago   . Decreased libido   . Allergy   . Pneumonia   . Anxiety   . GERD (gastroesophageal reflux disease)     Past Surgical History  Procedure Laterality Date  . Joint replacement Right     hip    No prescriptions prior to admission   No Known Allergies  Social History  Substance Use Topics  . Smoking status: Never Smoker   . Smokeless tobacco: Never Used  . Alcohol Use: No    Family History  Problem Relation Age of Onset  . Diabetes Mother   . Heart disease Mother   . Alzheimer's disease Mother      Review of Systems  Constitutional: Negative.   HENT: Negative.    Eyes: Negative.   Respiratory: Negative.   Cardiovascular: Negative.   Gastrointestinal: Negative.   Genitourinary: Negative.   Musculoskeletal: Positive for joint pain.  Skin: Negative.   Neurological: Negative.   Endo/Heme/Allergies: Negative.   Psychiatric/Behavioral: Negative.     Objective:  Physical Exam  Constitutional: He is oriented to person, place, and time. He appears well-developed and well-nourished.  HENT:  Head: Normocephalic.  Eyes: Pupils are equal, round, and reactive to light.  Neck: Normal range of motion. Neck supple.  Cardiovascular: Normal rate and regular rhythm.   Respiratory: Effort normal.  GI: Soft.  Musculoskeletal:  Left hip painful with motion.  CSM good distally.  ROM 5* IR and 20* ER.  Flex to 80*.  Leg lengths equal.  Skin intact  Neurological: He is alert and oriented to person, place, and time.  Skin: Skin is warm and dry.  Psychiatric: He has a normal mood and affect. His behavior is normal.    Vital signs in last 24 hours:    Labs:   Estimated body mass index is 25.86 kg/(m^2) as calculated from the following:   Height as of 01/12/15: 6' (1.829 m).   Weight as of 01/12/15: 86.501 kg (190 lb 11.2 oz).   Imaging Review Plain radiographs demonstrate severe degenerative joint disease of the left hip(s). The bone quality appears to be good for age and reported activity level.  Assessment/Plan:  End stage arthritis, left hip(s)  The patient history, physical  examination, clinical judgement of the provider and imaging studies are consistent with end stage degenerative joint disease of the left hip(s) and total hip arthroplasty is deemed medically necessary. The treatment options including medical management, injection therapy, arthroscopy and arthroplasty were discussed at length. The risks and benefits of total hip arthroplasty were presented and reviewed. The risks due to aseptic loosening, infection, stiffness,  dislocation/subluxation,  thromboembolic complications and other imponderables were discussed.  The patient acknowledged the explanation, agreed to proceed with the plan and consent was signed. Patient is being admitted for inpatient treatment for surgery, pain control, PT, OT, prophylactic antibiotics, VTE prophylaxis, progressive ambulation and ADL's and discharge planning.The patient is planning to be discharged home with home health services

## 2015-04-14 ENCOUNTER — Encounter: Payer: Self-pay | Admitting: Emergency Medicine

## 2015-04-14 ENCOUNTER — Ambulatory Visit: Payer: BLUE CROSS/BLUE SHIELD | Admitting: Anesthesiology

## 2015-04-14 ENCOUNTER — Inpatient Hospital Stay: Payer: BLUE CROSS/BLUE SHIELD

## 2015-04-14 ENCOUNTER — Encounter
Admission: AD | Disposition: A | Discharge status: Home Health | Payer: Self-pay | Source: Ambulatory Visit | Attending: Specialist

## 2015-04-14 ENCOUNTER — Inpatient Hospital Stay
Admission: AD | Admit: 2015-04-14 | Discharge: 2015-04-16 | DRG: 470 | Disposition: A | Discharge status: Home Health | Payer: BLUE CROSS/BLUE SHIELD | Source: Ambulatory Visit | Attending: Specialist | Admitting: Specialist

## 2015-04-14 DIAGNOSIS — Z96649 Presence of unspecified artificial hip joint: Secondary | ICD-10-CM

## 2015-04-14 DIAGNOSIS — N400 Enlarged prostate without lower urinary tract symptoms: Secondary | ICD-10-CM | POA: Diagnosis present

## 2015-04-14 DIAGNOSIS — H189 Unspecified disorder of cornea: Secondary | ICD-10-CM | POA: Diagnosis present

## 2015-04-14 DIAGNOSIS — M1612 Unilateral primary osteoarthritis, left hip: Principal | ICD-10-CM | POA: Diagnosis present

## 2015-04-14 DIAGNOSIS — K219 Gastro-esophageal reflux disease without esophagitis: Secondary | ICD-10-CM | POA: Diagnosis present

## 2015-04-14 DIAGNOSIS — Z96641 Presence of right artificial hip joint: Secondary | ICD-10-CM | POA: Diagnosis present

## 2015-04-14 DIAGNOSIS — I10 Essential (primary) hypertension: Secondary | ICD-10-CM | POA: Diagnosis present

## 2015-04-14 DIAGNOSIS — S7290XA Unspecified fracture of unspecified femur, initial encounter for closed fracture: Secondary | ICD-10-CM

## 2015-04-14 HISTORY — PX: TOTAL HIP ARTHROPLASTY: SHX124

## 2015-04-14 LAB — CBC
HCT: 38 % — ABNORMAL LOW (ref 40.0–52.0)
HEMOGLOBIN: 12.5 g/dL — AB (ref 13.0–18.0)
MCH: 30.3 pg (ref 26.0–34.0)
MCHC: 32.8 g/dL (ref 32.0–36.0)
MCV: 92.4 fL (ref 80.0–100.0)
Platelets: 220 10*3/uL (ref 150–440)
RBC: 4.11 MIL/uL — AB (ref 4.40–5.90)
RDW: 13.4 % (ref 11.5–14.5)
WBC: 9.7 10*3/uL (ref 3.8–10.6)

## 2015-04-14 LAB — CREATININE, SERUM: CREATININE: 0.81 mg/dL (ref 0.61–1.24)

## 2015-04-14 SURGERY — ARTHROPLASTY, HIP, TOTAL,POSTERIOR APPROACH
Anesthesia: Spinal | Laterality: Left | Wound class: Clean

## 2015-04-14 MED ORDER — NEOMYCIN-POLYMYXIN B GU 40-200000 IR SOLN
Status: AC
  Filled 2015-04-14: qty 1

## 2015-04-14 MED ORDER — LISINOPRIL 20 MG PO TABS
20.0000 mg | ORAL_TABLET | Freq: Every day | ORAL | Status: DC
  Administered 2015-04-15 – 2015-04-16 (×2): 20 mg via ORAL
  Filled 2015-04-14 (×2): qty 1

## 2015-04-14 MED ORDER — PHENOL 1.4 % MT LIQD: 1.0000 | OROMUCOSAL | Status: DC | PRN

## 2015-04-14 MED ORDER — ONDANSETRON HCL 4 MG/2ML IJ SOLN: 4.0000 mg | Freq: Four times a day (QID) | INTRAMUSCULAR | Status: DC | PRN

## 2015-04-14 MED ORDER — TRANEXAMIC ACID 1000 MG/10ML IV SOLN
1000.0000 mg | Freq: Once | INTRAVENOUS | Status: AC
  Administered 2015-04-14: 1000 mg via INTRAVENOUS
  Filled 2015-04-14: qty 10

## 2015-04-14 MED ORDER — CELECOXIB 200 MG PO CAPS
ORAL_CAPSULE | ORAL | Status: AC
  Administered 2015-04-14: 400 mg via ORAL
  Filled 2015-04-14: qty 2

## 2015-04-14 MED ORDER — ACETAMINOPHEN 500 MG PO TABS: 1000.0000 mg | ORAL_TABLET | Freq: Four times a day (QID) | ORAL | Status: AC | PRN

## 2015-04-14 MED ORDER — VANCOMYCIN HCL 10 G IV SOLR
1500.0000 mg | Freq: Once | INTRAVENOUS | Status: AC
  Administered 2015-04-14: 1500 mg via INTRAVENOUS
  Filled 2015-04-14: qty 1500

## 2015-04-14 MED ORDER — PROPOFOL 10 MG/ML IV BOLUS
INTRAVENOUS | Status: DC | PRN
  Administered 2015-04-14: 40 mg via INTRAVENOUS

## 2015-04-14 MED ORDER — ONDANSETRON HCL 4 MG/2ML IJ SOLN
INTRAMUSCULAR | Status: DC | PRN
  Administered 2015-04-14: 4 mg via INTRAVENOUS

## 2015-04-14 MED ORDER — PREGABALIN 75 MG PO CAPS
75.0000 mg | ORAL_CAPSULE | Freq: Once | ORAL | Status: AC
  Administered 2015-04-14: 75 mg via ORAL

## 2015-04-14 MED ORDER — PREGABALIN 75 MG PO CAPS
75.0000 mg | ORAL_CAPSULE | Freq: Two times a day (BID) | ORAL | Status: DC
  Administered 2015-04-14 – 2015-04-16 (×4): 75 mg via ORAL
  Filled 2015-04-14 (×4): qty 1

## 2015-04-14 MED ORDER — PROMETHAZINE HCL 25 MG/ML IJ SOLN: 6.2500 mg | INTRAMUSCULAR | Status: DC | PRN

## 2015-04-14 MED ORDER — PREGABALIN 75 MG PO CAPS
ORAL_CAPSULE | ORAL | Status: AC
  Administered 2015-04-14: 75 mg via ORAL
  Filled 2015-04-14: qty 1

## 2015-04-14 MED ORDER — OMEGA-3-ACID ETHYL ESTERS 1 G PO CAPS
1.0000 g | ORAL_CAPSULE | Freq: Two times a day (BID) | ORAL | Status: DC
  Administered 2015-04-14 – 2015-04-16 (×4): 1 g via ORAL
  Filled 2015-04-14 (×4): qty 1

## 2015-04-14 MED ORDER — DUTASTERIDE-TAMSULOSIN HCL 0.5-0.4 MG PO CAPS: 1.0000 | ORAL_CAPSULE | Freq: Every day | ORAL | Status: DC

## 2015-04-14 MED ORDER — SODIUM CHLORIDE 0.9 % IV SOLN
1500.0000 mg | Freq: Two times a day (BID) | INTRAVENOUS | Status: AC
  Administered 2015-04-14: 1500 mg via INTRAVENOUS
  Filled 2015-04-14: qty 1500

## 2015-04-14 MED ORDER — METHOCARBAMOL 1000 MG/10ML IJ SOLN
500.0000 mg | Freq: Four times a day (QID) | INTRAVENOUS | Status: DC | PRN
  Filled 2015-04-14: qty 5

## 2015-04-14 MED ORDER — LACTATED RINGERS IV SOLN: Freq: Once | INTRAVENOUS | Status: DC

## 2015-04-14 MED ORDER — METOCLOPRAMIDE HCL 10 MG PO TABS: 5.0000 mg | ORAL_TABLET | Freq: Three times a day (TID) | ORAL | Status: DC | PRN

## 2015-04-14 MED ORDER — SENNA 8.6 MG PO TABS
1.0000 | ORAL_TABLET | Freq: Two times a day (BID) | ORAL | Status: DC
  Administered 2015-04-14 – 2015-04-16 (×4): 8.6 mg via ORAL
  Filled 2015-04-14 (×4): qty 1

## 2015-04-14 MED ORDER — ALUM & MAG HYDROXIDE-SIMETH 200-200-20 MG/5ML PO SUSP: 30.0000 mL | ORAL | Status: DC | PRN

## 2015-04-14 MED ORDER — BUPIVACAINE-EPINEPHRINE (PF) 0.5% -1:200000 IJ SOLN
INTRAMUSCULAR | Status: DC | PRN
  Administered 2015-04-14: 30 mL

## 2015-04-14 MED ORDER — LACTATED RINGERS IV SOLN
INTRAVENOUS | Status: DC
  Administered 2015-04-14: 75 mL/h via INTRAVENOUS
  Administered 2015-04-14: 08:00:00 via INTRAVENOUS

## 2015-04-14 MED ORDER — SODIUM CHLORIDE 0.9 % IV SOLN
10000.0000 ug | INTRAVENOUS | Status: DC | PRN
  Administered 2015-04-14: 25 ug/min via INTRAVENOUS

## 2015-04-14 MED ORDER — ONDANSETRON HCL 4 MG PO TABS: 4.0000 mg | ORAL_TABLET | Freq: Four times a day (QID) | ORAL | Status: DC | PRN

## 2015-04-14 MED ORDER — VITAMIN D (ERGOCALCIFEROL) 1.25 MG (50000 UNIT) PO CAPS
50000.0000 [IU] | ORAL_CAPSULE | ORAL | Status: DC
  Filled 2015-04-14: qty 1

## 2015-04-14 MED ORDER — ENOXAPARIN SODIUM 30 MG/0.3ML ~~LOC~~ SOLN
30.0000 mg | Freq: Two times a day (BID) | SUBCUTANEOUS | Status: DC
  Administered 2015-04-15 – 2015-04-16 (×3): 30 mg via SUBCUTANEOUS
  Filled 2015-04-14 (×3): qty 0.3

## 2015-04-14 MED ORDER — DUTASTERIDE 0.5 MG PO CAPS
0.5000 mg | ORAL_CAPSULE | Freq: Every day | ORAL | Status: DC
  Administered 2015-04-15 – 2015-04-16 (×2): 0.5 mg via ORAL
  Filled 2015-04-14 (×3): qty 1

## 2015-04-14 MED ORDER — BUPIVACAINE-EPINEPHRINE (PF) 0.5% -1:200000 IJ SOLN
INTRAMUSCULAR | Status: AC
  Filled 2015-04-14: qty 30

## 2015-04-14 MED ORDER — METOCLOPRAMIDE HCL 5 MG/ML IJ SOLN: 5.0000 mg | Freq: Three times a day (TID) | INTRAMUSCULAR | Status: DC | PRN

## 2015-04-14 MED ORDER — HYDROCODONE-ACETAMINOPHEN 10-325 MG PO TABS
1.0000 | ORAL_TABLET | ORAL | Status: DC | PRN
  Administered 2015-04-15 (×2): 1 via ORAL
  Filled 2015-04-14 (×2): qty 1

## 2015-04-14 MED ORDER — CELECOXIB 200 MG PO CAPS
200.0000 mg | ORAL_CAPSULE | Freq: Two times a day (BID) | ORAL | Status: DC
  Administered 2015-04-14 – 2015-04-16 (×4): 200 mg via ORAL
  Filled 2015-04-14 (×4): qty 1

## 2015-04-14 MED ORDER — DUTASTERIDE 0.5 MG PO CAPS
0.5000 mg | ORAL_CAPSULE | Freq: Every day | ORAL | Status: DC
  Filled 2015-04-14: qty 1

## 2015-04-14 MED ORDER — FISH OIL OIL: TOPICAL_OIL | Freq: Two times a day (BID) | Status: DC

## 2015-04-14 MED ORDER — FAMOTIDINE 20 MG PO TABS
ORAL_TABLET | ORAL | Status: AC
  Administered 2015-04-14: 20 mg via ORAL
  Filled 2015-04-14: qty 1

## 2015-04-14 MED ORDER — SODIUM CHLORIDE 0.9 % IJ SOLN
INTRAMUSCULAR | Status: AC
  Filled 2015-04-14: qty 3

## 2015-04-14 MED ORDER — DIPHENHYDRAMINE HCL 12.5 MG/5ML PO ELIX: 12.5000 mg | ORAL_SOLUTION | ORAL | Status: DC | PRN

## 2015-04-14 MED ORDER — TRANEXAMIC ACID 1000 MG/10ML IV SOLN
1000.0000 mg | INTRAVENOUS | Status: AC
  Administered 2015-04-14: 1000 mg via INTRAVENOUS
  Filled 2015-04-14: qty 10

## 2015-04-14 MED ORDER — FENTANYL CITRATE (PF) 100 MCG/2ML IJ SOLN
INTRAMUSCULAR | Status: DC | PRN
  Administered 2015-04-14: 50 ug via INTRAVENOUS

## 2015-04-14 MED ORDER — ENOXAPARIN SODIUM 40 MG/0.4ML ~~LOC~~ SOLN: 40.0000 mg | SUBCUTANEOUS | Status: DC

## 2015-04-14 MED ORDER — ACETAMINOPHEN 650 MG RE SUPP: 650.0000 mg | Freq: Four times a day (QID) | RECTAL | Status: DC | PRN

## 2015-04-14 MED ORDER — BUPIVACAINE LIPOSOME 1.3 % IJ SUSP
INTRAMUSCULAR | Status: AC
  Filled 2015-04-14: qty 20

## 2015-04-14 MED ORDER — MORPHINE SULFATE (PF) 2 MG/ML IV SOLN
2.0000 mg | INTRAVENOUS | Status: DC | PRN
Start: 2015-04-14 — End: 2015-04-16
  Administered 2015-04-14 (×2): 2 mg via INTRAVENOUS
  Filled 2015-04-14 (×2): qty 1

## 2015-04-14 MED ORDER — MAGNESIUM HYDROXIDE 400 MG/5ML PO SUSP
30.0000 mL | Freq: Every day | ORAL | Status: DC | PRN
  Administered 2015-04-15: 30 mL via ORAL
  Filled 2015-04-14: qty 30

## 2015-04-14 MED ORDER — TAMSULOSIN HCL 0.4 MG PO CAPS
0.4000 mg | ORAL_CAPSULE | Freq: Every day | ORAL | Status: DC
  Administered 2015-04-15 – 2015-04-16 (×2): 0.4 mg via ORAL
  Filled 2015-04-14 (×2): qty 1

## 2015-04-14 MED ORDER — HYDROCODONE-ACETAMINOPHEN 7.5-325 MG PO TABS
1.0000 | ORAL_TABLET | Freq: Four times a day (QID) | ORAL | Status: DC
  Administered 2015-04-14 – 2015-04-16 (×7): 1 via ORAL
  Filled 2015-04-14 (×7): qty 1

## 2015-04-14 MED ORDER — METHOCARBAMOL 500 MG PO TABS: 500.0000 mg | ORAL_TABLET | Freq: Four times a day (QID) | ORAL | Status: DC | PRN

## 2015-04-14 MED ORDER — CELECOXIB 200 MG PO CAPS
400.0000 mg | ORAL_CAPSULE | Freq: Once | ORAL | Status: AC
  Administered 2015-04-14: 400 mg via ORAL

## 2015-04-14 MED ORDER — FENTANYL CITRATE (PF) 100 MCG/2ML IJ SOLN: 25.0000 ug | INTRAMUSCULAR | Status: DC | PRN

## 2015-04-14 MED ORDER — SODIUM CHLORIDE 0.9 % IJ SOLN
INTRAMUSCULAR | Status: AC
  Filled 2015-04-14: qty 10

## 2015-04-14 MED ORDER — PROPOFOL 500 MG/50ML IV EMUL
INTRAVENOUS | Status: DC | PRN
  Administered 2015-04-14: 100 ug/kg/min via INTRAVENOUS

## 2015-04-14 MED ORDER — PHENYLEPHRINE HCL 10 MG/ML IJ SOLN
INTRAMUSCULAR | Status: DC | PRN
  Administered 2015-04-14: 100 ug via INTRAVENOUS
  Administered 2015-04-14: 200 ug via INTRAVENOUS
  Administered 2015-04-14: 100 ug via INTRAVENOUS
  Administered 2015-04-14: 200 ug via INTRAVENOUS
  Administered 2015-04-14: 100 ug via INTRAVENOUS

## 2015-04-14 MED ORDER — SODIUM CHLORIDE 0.45 % IV SOLN
INTRAVENOUS | Status: DC
  Administered 2015-04-14 – 2015-04-15 (×2): via INTRAVENOUS

## 2015-04-14 MED ORDER — NEOMYCIN-POLYMYXIN B GU 40-200000 IR SOLN
Status: DC | PRN
  Administered 2015-04-14: 16 mL

## 2015-04-14 MED ORDER — FLEET ENEMA 7-19 GM/118ML RE ENEM: 1.0000 | ENEMA | Freq: Once | RECTAL | Status: DC | PRN

## 2015-04-14 MED ORDER — GLUCOSAMINE-CHONDROITIN 500-400 MG PO CAPS: ORAL_CAPSULE | Freq: Two times a day (BID) | ORAL | Status: DC

## 2015-04-14 MED ORDER — FAMOTIDINE 20 MG PO TABS
20.0000 mg | ORAL_TABLET | Freq: Once | ORAL | Status: AC
  Administered 2015-04-14: 20 mg via ORAL

## 2015-04-14 MED ORDER — BISACODYL 10 MG RE SUPP
10.0000 mg | Freq: Every day | RECTAL | Status: DC | PRN
  Administered 2015-04-16: 10 mg via RECTAL
  Filled 2015-04-14 (×2): qty 1

## 2015-04-14 MED ORDER — FERROUS SULFATE 325 (65 FE) MG PO TABS
325.0000 mg | ORAL_TABLET | Freq: Every day | ORAL | Status: DC
  Administered 2015-04-15 – 2015-04-16 (×2): 325 mg via ORAL
  Filled 2015-04-14 (×2): qty 1

## 2015-04-14 MED ORDER — BUPIVACAINE HCL (PF) 0.5 % IJ SOLN
INTRAMUSCULAR | Status: DC | PRN
  Administered 2015-04-14: 3 mL

## 2015-04-14 MED ORDER — SODIUM CHLORIDE 0.9 % IJ SOLN
INTRAMUSCULAR | Status: AC
  Filled 2015-04-14: qty 50

## 2015-04-14 MED ORDER — MENTHOL 3 MG MT LOZG: 1.0000 | LOZENGE | OROMUCOSAL | Status: DC | PRN

## 2015-04-14 MED ORDER — ACETAMINOPHEN 325 MG PO TABS: 650.0000 mg | ORAL_TABLET | Freq: Four times a day (QID) | ORAL | Status: DC | PRN

## 2015-04-14 MED ORDER — MIDAZOLAM HCL 5 MG/5ML IJ SOLN
INTRAMUSCULAR | Status: DC | PRN
  Administered 2015-04-14: 2 mg via INTRAVENOUS

## 2015-04-14 MED ORDER — ERYTHROMYCIN 5 MG/GM OP OINT
TOPICAL_OINTMENT | Freq: Three times a day (TID) | OPHTHALMIC | Status: DC
  Administered 2015-04-14 – 2015-04-16 (×6): 1 via OPHTHALMIC
  Filled 2015-04-14: qty 3.5

## 2015-04-14 SURGICAL SUPPLY — 52 items
AUTOTRANSFUS HAS 1/8 (MISCELLANEOUS) ×2
BAG COUNTER SPONGE EZ (MISCELLANEOUS) IMPLANT
BLADE DEBAKEY 8.0 (BLADE) ×2 IMPLANT
BLADE SAGITTAL WIDE XTHICK NO (BLADE) ×2 IMPLANT
BRUSH STANDARD PREP 14M (MISCELLANEOUS) ×2 IMPLANT
BUR EGG ELITE 5.0 (BURR) IMPLANT
CANISTER SUCT 1200ML W/VALVE (MISCELLANEOUS) ×2 IMPLANT
CANISTER SUCT 3000ML (MISCELLANEOUS) ×2 IMPLANT
CAPT HIP TOTAL 2 ×2 IMPLANT
CATH TRAY METER 16FR LF (MISCELLANEOUS) ×2 IMPLANT
CHLORAPREP W/TINT 26ML (MISCELLANEOUS) ×2 IMPLANT
DRAPE INCISE IOBAN 66X60 STRL (DRAPES) ×2 IMPLANT
DRAPE SHEET LG 3/4 BI-LAMINATE (DRAPES) ×2 IMPLANT
DRAPE TABLE BACK 80X90 (DRAPES) ×2 IMPLANT
DRSG AQUACEL AG ADV 3.5X10 (GAUZE/BANDAGES/DRESSINGS) ×2 IMPLANT
DRSG AQUACEL AG ADV 3.5X14 (GAUZE/BANDAGES/DRESSINGS) ×2 IMPLANT
ELECT BLADE 6 FLAT ULTRCLN (ELECTRODE) ×2 IMPLANT
ELECT REM PT RETURN 9FT ADLT (ELECTROSURGICAL) ×2
ELECTRODE REM PT RTRN 9FT ADLT (ELECTROSURGICAL) ×1 IMPLANT
GAUZE PETRO XEROFOAM 1X8 (MISCELLANEOUS) ×2 IMPLANT
GAUZE SPONGE 4X4 12PLY STRL (GAUZE/BANDAGES/DRESSINGS) ×2 IMPLANT
GLOVE INDICATOR 8.0 STRL GRN (GLOVE) ×2 IMPLANT
GLOVE SURG ORTHO 8.5 STRL (GLOVE) ×2 IMPLANT
GOWN STRL REUS W/ TWL LRG LVL3 (GOWN DISPOSABLE) ×2 IMPLANT
GOWN STRL REUS W/ TWL XL LVL3 (GOWN DISPOSABLE) ×1 IMPLANT
GOWN STRL REUS W/TWL LRG LVL3 (GOWN DISPOSABLE) ×2
GOWN STRL REUS W/TWL XL LVL3 (GOWN DISPOSABLE) ×1
HANDPIECE SUCTION TUBG SURGILV (MISCELLANEOUS) ×2 IMPLANT
KIT RM TURNOVER STRD PROC AR (KITS) ×2 IMPLANT
NEEDLE SPNL 18GX3.5 QUINCKE PK (NEEDLE) ×2 IMPLANT
NS IRRIG 1000ML POUR BTL (IV SOLUTION) ×2 IMPLANT
PACK HIP PROSTHESIS (MISCELLANEOUS) ×2 IMPLANT
PAD PREP 24X41 OB/GYN DISP (PERSONAL CARE ITEMS) ×2 IMPLANT
PIN STEIN THRED 5/32 (Pin) ×2 IMPLANT
SOL .9 NS 3000ML IRR  AL (IV SOLUTION) ×1
SOL .9 NS 3000ML IRR UROMATIC (IV SOLUTION) ×1 IMPLANT
SPONGE LAP 18X18 5 PK (GAUZE/BANDAGES/DRESSINGS) ×2 IMPLANT
STAPLER SKIN PROX 35W (STAPLE) ×2 IMPLANT
SUT BONE WAX W31G (SUTURE) IMPLANT
SUT DVC 2 QUILL PDO  T11 36X36 (SUTURE) ×1
SUT DVC 2 QUILL PDO T11 36X36 (SUTURE) ×1 IMPLANT
SUT ETHIBOND NAB CT1 #1 30IN (SUTURE) ×2 IMPLANT
SUT QUILL PDO 0 36 36 VIOLET (SUTURE) ×2 IMPLANT
SUT TICRON 2-0 30IN 311381 (SUTURE) ×8 IMPLANT
SUT VIC AB 2-0 CT1 27 (SUTURE) ×2
SUT VIC AB 2-0 CT1 TAPERPNT 27 (SUTURE) ×2 IMPLANT
SYR 20CC LL (SYRINGE) ×2 IMPLANT
SYR 30ML LL (SYRINGE) ×2 IMPLANT
SYSTEM AUTOTRANSFUS DUAL TROCR (MISCELLANEOUS) ×1 IMPLANT
TAPE TRANSPORE STRL 2 31045 (GAUZE/BANDAGES/DRESSINGS) ×2 IMPLANT
TUBE SUCT KAM VAC (TUBING) ×2 IMPLANT
WATER STERILE IRR 1000ML POUR (IV SOLUTION) ×2 IMPLANT

## 2015-04-14 NOTE — H&P (Signed)
THE PATIENT WAS SEEN IN THE HOLDING AREA.  HISTORY, ALLERGIES, HOME MEDICATIONS AND OPERATIVE PROCEDURE WERE REVIEWED. RISKS AND BENEFITS OF SURGERY DISCUSSED WITH PATIENT AGAIN.  NO CHANGES FROM INITIAL HISTORY AND PHYSICAL NOTED.    

## 2015-04-14 NOTE — Anesthesia Preprocedure Evaluation (Signed)
Anesthesia Evaluation  Patient identified by MRN, date of birth, ID band Patient awake    Reviewed: Allergy & Precautions, H&P , NPO status , Patient's Chart, lab work & pertinent test results, reviewed documented beta blocker date and time   History of Anesthesia Complications Negative for: history of anesthetic complications  Airway Mallampati: I  TM Distance: >3 FB Neck ROM: full    Dental no notable dental hx. (+) Missing   Pulmonary neg pulmonary ROS,    Pulmonary exam normal breath sounds clear to auscultation       Cardiovascular Exercise Tolerance: Good hypertension, (-) angina(-) CAD, (-) Past MI, (-) Cardiac Stents and (-) CABG Normal cardiovascular exam(-) dysrhythmias (-) Valvular Problems/Murmurs Rhythm:regular Rate:Normal     Neuro/Psych negative neurological ROS  negative psych ROS   GI/Hepatic Neg liver ROS, GERD  ,  Endo/Other  negative endocrine ROS  Renal/GU negative Renal ROS  negative genitourinary   Musculoskeletal   Abdominal   Peds  Hematology negative hematology ROS (+)   Anesthesia Other Findings Past Medical History:   Prostatitis                                                  Hypertension                                                 Lumbago                                                      Decreased libido                                             Allergy                                                      Pneumonia                                                    Anxiety                                                      GERD (gastroesophageal reflux disease)                       Reproductive/Obstetrics negative OB ROS  Anesthesia Physical Anesthesia Plan  ASA: II  Anesthesia Plan: Spinal   Post-op Pain Management:    Induction:   Airway Management Planned:   Additional Equipment:   Intra-op Plan:    Post-operative Plan:   Informed Consent: I have reviewed the patients History and Physical, chart, labs and discussed the procedure including the risks, benefits and alternatives for the proposed anesthesia with the patient or authorized representative who has indicated his/her understanding and acceptance.   Dental Advisory Given  Plan Discussed with: Anesthesiologist, CRNA and Surgeon  Anesthesia Plan Comments:         Anesthesia Quick Evaluation

## 2015-04-14 NOTE — Consult Note (Signed)
Reason for Consult: left eye pain Referring Physician: Rosey Bath, MD  Eric Rose is an 60 y.o. male.  Chief complaint: left eye pain and redness  HPI: Awoke from total hip replacement earlier today with left eye pain. Vision is stable from previous, although pt notes that his left eye has never seen well due to a softball injury to OS when he was a child.  Past Medical History  Diagnosis Date  . Prostatitis   . Hypertension   . Lumbago   . Decreased libido   . Allergy   . Pneumonia   . Anxiety   . GERD (gastroesophageal reflux disease)     ROS  Past Surgical History  Procedure Laterality Date  . Joint replacement Right     hip    Family History  Problem Relation Age of Onset  . Diabetes Mother   . Heart disease Mother   . Alzheimer's disease Mother     Social History:  reports that he has never smoked. He has never used smokeless tobacco. He reports that he does not drink alcohol or use illicit drugs.  Allergies: No Known Allergies   Blood pressure 117/81, pulse 69, temperature 96.6 F (35.9 C), temperature source Axillary, resp. rate 18, height 6' (1.829 m), weight 83.008 kg (183 lb), SpO2 100 %.  Mental status: Alert and Oriented x 3  Visual Acuity:  20/25 OD  20/60 near [place]  Pupils:  Equally round/ reactive to light.  No Afferent defect.  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  Can follow up as outpatient within 1 week of discharge to [facility].  Delynn Flavin [facility] 04/14/2015, 1:19 PM

## 2015-04-14 NOTE — Op Note (Signed)
04/14/2015  10:02 AM  PATIENT:  Eric Rose   MRN: [id number]  PRE-OPERATIVE DIAGNOSIS:  UNILATERAL PRIMARY OSTEOARTHRITIS LEFT HIP  POST-OPERATIVE DIAGNOSIS:  UNILATERAL PRIMARY OSTEOARTHRITIS LEFT HIP  PROCEDURE:  Procedure(s): TOTAL HIP ARTHROPLASTY LEFT.  AML CEMENTLESS  PREOPERATIVE INDICATIONS:    Eric Rose is an 60 y.o. male who has a diagnosis of UNILATERAL PRIMARY OSTEOARTHRITIS and elected for surgical management after failing conservative treatment.  The risks benefits and alternatives were discussed with the patient including but not limited to the risks of nonoperative treatment, versus surgical intervention including infection, bleeding, nerve injury, periprosthetic fracture, the need for revision surgery, dislocation, leg length discrepancy, blood clots, cardiopulmonary complications, morbidity, mortality, among others, and they were willing to proceed.    OPERATIVE FINDINGS:  SEVERE OSTEOARTHRITIS WITH COMPLETE JOINT SPACE LOSS   SURGEON: Park Breed, MD    ASSISTANT:  Thornton Park  ,MD  ANESTHESIA: Spinal    COMPLICATIONS:  None.   DRAINS: 2 Autovacs  EBL: 200                    REPLACED:   NONE    COMPONENTS:  Depuy AML  femur size 19.59m wide stature with a 36 mm/ +1.5 head ball and a Pinnacle 300 acetabular shell size 56 mm with a neutral polyethylene liner    PROCEDURE IN DETAIL:   The patient was met in the holding area and  identified.  The appropriate hip was identified and marked at the operative site.  The patient was then transported to the OR  and  placed under spinall anesthesia.  At that point, the patient was  placed in the lateral decubitus position with the operative side up and  secured to the operating room table and all bony prominences padded.     The operative lower extremity was prepped from the iliac crest to the distal leg.  Sterile draping was performed.  Time out was performed prior to incision.      A routine  posterolateral approach was utilized via sharp dissection  carried down to the subcutaneous tissue.  Gross bleeders were Bovie coagulated.  The iliotibial band was identified and incised along the length of the skin incision.  Self-retaining retractors were  inserted.  With the hip internally rotated, the short external rotators  were identified. The piriformis and capsule was tagged with Ticron, and the hip capsule released in a T-type fashion.  The femoral neck was exposed, and I resected the femoral neck using the appropriate jig. This was performed at approximately a thumb's breadth above the lesser trochanter.    I then exposed the deep acetabulum, cleared out any tissue including the ligamentum teres.  A wing retractor was placed.  After adequate visualization, I excised the labrum, and then sequentially reamed.  I placed the trial acetabulum, which seated nicely, and then impacted the real cup into place.  Appropriate version and inclination was confirmed clinically matching their bony anatomy, and also with the use of the jig.  A trial polyethylene liner was placed and the wing retractor removed.    I then prepared the proximal femur using the cookie-cutter, the lateralizing reamer, and then sequentially reamed and broached.  A trial broach, neck, and head was utilized, and I reduced the hip and it was found to have excellent stability with functional range of motion. The trial components were then removed, and the real polyethylene liner was placed with the lip directed posteriorly.  I  then impacted the real femoral prosthesis into place into the appropriate version, slightly anteverted to the normal anatomy, and I impacted the real head ball into place. The hip was then reduced and taken through functional range of motion and found to have excellent stability. Leg lengths were restored.  I closed the T in the capsule. And repaired the posterior capsule with #2 Ticron.  Piriformis was repaired  with the same suture.  I then irrigated the hip copiously again with pulse lavage, and repaired the fascia with #2 Quill, followed by 0 Quill for the subcutaneous tissue, and staplesl for the skin, Steri-Strips and Aquacel. Sponge and needle counts were correct.  The patient was then awakened and returned to PACU in stable and satisfactory condition. There were no complications.  Park Breed, MD  04/14/2015 10:02 AM

## 2015-04-14 NOTE — Anesthesia Postprocedure Evaluation (Signed)
Anesthesia Post Note  Patient: Eric Rose  Procedure(s) Performed: Procedure(s) (LRB): TOTAL HIP ARTHROPLASTY (Left)  Comments: Patient noted to have a feeling "of something in my eye."  There is nothing visible in the patient's eye on my assessment, so this is likely a corneal abrasion.  We will have ophthalmology come see the patient to assess the patient's eye.    Last Vitals:  Filed Vitals:   04/14/15 1112 04/14/15 1121  BP:  127/76  Pulse: 72 70  Temp:    Resp: 14 15    Last Pain:  Filed Vitals:   04/14/15 1140  PainSc: 0-No pain                 Martha Clan

## 2015-04-14 NOTE — Care Management Note (Signed)
Case Management Note  Patient Details  Name: Eric Rose MRN: [id number] Date of Birth: [birth year]  Subjective/Objective:                  Met with patient to discuss discharge planning. He plans to return home with his wife and his son. He states he gave his walker away less than 5 years ago. He thinks he has crutches and has a cane; PT pending. He used [facility] in the past and would like to use them again. He uses [facility] for Rx  Action/Plan: List of home health agencies left with patient. Referral called to [facility] with [facility]. I have asked Will with [facility] to check to see if Medicare will cover a rolling walker. RNCM will continue to follow.   Expected Discharge Date:  04/17/15               Expected Discharge Plan:     In-House Referral:     Discharge planning Services  CM Consult  Post Acute Care Choice:  Home Health, Durable Medical Equipment Choice offered to:  Patient  DME Arranged:  Walker rolling DME Agency:  [place]:  PT [facility] Agency:  [facility]  Status of Service:  In process, will continue to follow  Medicare Important Message Given:    Date Medicare IM Given:    Medicare IM give by:    Date Additional Medicare IM Given:    Additional Medicare Important Message give by:     If discussed at [place] of Stay Meetings, dates discussed:    Additional Comments:  Marshell Garfinkel, RN 04/14/2015, 1:55 PM

## 2015-04-14 NOTE — Anesthesia Procedure Notes (Addendum)
Date/Time: 04/14/2015 8:45 AM Performed by: Nelda Marseille Pre-anesthesia Checklist: Patient identified, Emergency Drugs available, Suction available, Patient being monitored and Timeout performed Oxygen Delivery Method: Simple face mask   Spinal Patient location during procedure: OR Start time: 04/14/2015 7:40 AM End time: 04/14/2015 7:45 AM Staffing Resident/CRNA: Nelda Marseille Performed by: resident/CRNA  Preanesthetic Checklist Completed: patient identified, site marked, surgical consent, pre-op evaluation, timeout performed, IV checked, risks and benefits discussed and monitors and equipment checked Spinal Block Patient position: sitting Prep: Betadine Patient monitoring: heart rate, continuous pulse ox, blood pressure and cardiac monitor Approach: midline Location: L3-4 Injection technique: single-shot Needle Needle type: Whitacre and Introducer  Needle gauge: 24 G Needle length: 9 cm Assessment Sensory level: T10 Additional Notes Negative paresthesia. Negative blood return. Positive free-flowing CSF. Expiration date of kit checked and confirmed. Patient tolerated procedure well, without complications. 3 ml 0.5% Marcaine with epi wash injected into SA space.  Spinal successful.

## 2015-04-14 NOTE — Transfer of Care (Signed)
Immediate Anesthesia Transfer of Care Note  Patient: Eric Rose  Procedure(s) Performed: Procedure(s): TOTAL HIP ARTHROPLASTY (Left)  Patient Location: PACU  Anesthesia Type:Spinal  Level of Consciousness: sedated  Airway & Oxygen Therapy: Patient Spontanous Breathing and Patient connected to face mask oxygen  Post-op Assessment: Report given to RN and Post -op Vital signs reviewed and stable  Post vital signs: Reviewed and stable  Last Vitals:  Filed Vitals:   04/14/15 0659  BP: 128/86  Pulse: 98  Temp: 36.6 C  Resp: 20    Complications: No apparent anesthesia complications

## 2015-04-14 NOTE — Evaluation (Signed)
Physical Therapy Evaluation Patient Details Name: Eric Rose MRN: [id number] DOB: July 06, 1955 Today's Date: 04/14/2015   History of Present Illness  Patient is a pleasant 60 y/o male that presents for L THR on 04/14/2015. 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   Clinical Impression  Patient has remained quite active recently, working up until this admission using a SPC, more for pain control than balance support. He reports an old accident has left him somewhat limited with his L eye, though no falls reported. Today he is able to complete bed mobility with very minimal assistance for bringing his trunk upright. He demonstrates appropriate technique with ambulation, though L knee noted to be flexed at times during gait for pain control. He demonstrates mild quad lag initially with SLRs, which could lead to flexed knee in gait. Patient otherwise tolerates session well, with expected increase in pain initially with exercises and ambulation, which reduced with repetitions. Skilled PT services are indicated to address his mobility deficits.     Follow Up Recommendations Home health PT    Equipment Recommendations  Rolling walker with 5" wheels    Recommendations for Other Services       Precautions / Restrictions Precautions Precautions: Posterior Hip;Fall Restrictions Weight Bearing Restrictions: Yes LLE Weight Bearing: Partial weight bearing LLE Partial Weight Bearing Percentage or Pounds: 50%      Mobility  Bed Mobility Overal bed mobility: Needs Assistance Bed Mobility: Supine to Sit     Supine to sit: Min guard;Min assist     General bed mobility comments: Patient requires prolonged time to bring LEs off the edge of the bed and requires minimal Assist to bring trunk upright into sitting. No loss of balance during transfer.   Transfers Overall transfer level: Needs assistance Equipment used: Rolling walker (2 wheeled) Transfers: Sit to/from Stand Sit to Stand: Min  guard         General transfer comment: Patient educated on hand placement, prolonged time to complete transfer however no buckling or loss of balance noted.   Ambulation/Gait Ambulation/Gait assistance: Supervision Ambulation Distance (Feet): 15 Feet Assistive device: Rolling walker (2 wheeled) Gait Pattern/deviations: Step-through pattern;Decreased stance time - left;Decreased weight shift to left;Narrow base of support;Decreased step length - right;Decreased step length - left   Gait velocity interpretation: Below normal speed for age/gender General Gait Details: Patient educated on step through technique with use of UEs to maintain PWB status. He maintains some knee flexion on LLE, however no buckling noted. Good weight bearing acceptance on LLE in transition from swing to stance.   Stairs            Wheelchair Mobility    Modified Rankin (Stroke Patients Only)       Balance Overall balance assessment: Needs assistance Sitting-balance support: No upper extremity supported Sitting balance-Leahy Scale: Good     Standing balance support: Bilateral upper extremity supported Standing balance-Leahy Scale: Fair Standing balance comment: Minor knee flexion noted in LLE, no buckling or loss of balance noted.                              Pertinent Vitals/Pain Pain Assessment: Faces Faces Pain Scale: Hurts a little bit Pain Location: L hip  Pain Descriptors / Indicators: Aching Pain Intervention(s): Limited activity within patient's tolerance;Monitored during session;Premedicated before session;Patient requesting pain meds-RN notified    Home Living Family/patient expects to be discharged to:: Private residence Living Arrangements: Spouse/significant other  Available Help at Discharge: Family;Available PRN/intermittently Type of Home: House Home Access: Stairs to enter Entrance Stairs-Rails: Can reach both Entrance Stairs-Number of Steps: 5 Home Layout: Two  level;Able to live on main level with bedroom/bathroom Home Equipment: Gilford Rile - 2 wheels;Cane - single point;Bedside commode;Toilet riser      Prior Function Level of Independence: Independent with assistive device(s)         Comments: Patient has been working up until surgery, he does use a SPC at baseline.      Hand Dominance        Extremity/Trunk Assessment   Upper Extremity Assessment: Overall WFL for tasks assessed           Lower Extremity Assessment: LLE deficits/detail   LLE Deficits / Details: Able to complete 10 SLRs and 10 LAQs with no assistance.      Communication   Communication: No difficulties  Cognition Arousal/Alertness: Awake/alert Behavior During Therapy: WFL for tasks assessed/performed;Anxious;Impulsive Overall Cognitive Status: Within Functional Limits for tasks assessed                      General Comments General comments (skin integrity, edema, etc.): Foley and wound drain in tact, appeared to be draining appropriately.     Exercises Total Joint Exercises Ankle Circles/Pumps: AROM;Both;10 reps Gluteal Sets: AROM;Both;10 reps Heel Slides: AROM;Both;10 reps Hip ABduction/ADduction: AROM;Both;10 reps Straight Leg Raises: AROM;Both;10 reps Long Arc Quad: AROM;Left;10 reps      Assessment/Plan    PT Assessment Patient needs continued PT services  PT Diagnosis Difficulty walking;Generalized weakness   PT Problem List Decreased strength;Decreased knowledge of use of DME;Decreased safety awareness;Pain;Decreased activity tolerance;Decreased balance;Decreased mobility  PT Treatment Interventions DME instruction;Gait training;Stair training;Therapeutic activities;Balance training;Manual techniques   PT Goals (Current goals can be found in the Care Plan section) Acute Rehab PT Goals Patient Stated Goal: To return home safely.  PT Goal Formulation: With patient Time For Goal Achievement: 04/28/15 Potential to Achieve Goals:  Good    Frequency BID   Barriers to discharge        Co-evaluation               End of Session Equipment Utilized During Treatment: Gait belt Activity Tolerance: Patient tolerated treatment well Patient left: in chair;with call bell/phone within reach;with chair alarm set Nurse Communication: Mobility status         Time: 1510-1539 PT Time Calculation (min) (ACUTE ONLY): 29 min   Charges:   PT Evaluation $PT Eval Moderate Complexity: 1 Procedure PT Treatments $Therapeutic Exercise: 8-22 mins   PT G Codes:       Kerman Passey, PT, DPT    04/14/2015, 6:19 PM

## 2015-04-15 ENCOUNTER — Ambulatory Visit: Payer: BLUE CROSS/BLUE SHIELD | Admitting: Urology

## 2015-04-15 ENCOUNTER — Encounter: Payer: Self-pay | Admitting: Urology

## 2015-04-15 LAB — CBC
HEMATOCRIT: 35.9 % — AB (ref 40.0–52.0)
Hemoglobin: 12 g/dL — ABNORMAL LOW (ref 13.0–18.0)
MCH: 31.1 pg (ref 26.0–34.0)
MCHC: 33.4 g/dL (ref 32.0–36.0)
MCV: 93.3 fL (ref 80.0–100.0)
Platelets: 199 10*3/uL (ref 150–440)
RBC: 3.85 MIL/uL — AB (ref 4.40–5.90)
RDW: 13.3 % (ref 11.5–14.5)
WBC: 9.5 10*3/uL (ref 3.8–10.6)

## 2015-04-15 LAB — BASIC METABOLIC PANEL
ANION GAP: 3 — AB (ref 5–15)
BUN: 8 mg/dL (ref 6–20)
CO2: 27 mmol/L (ref 22–32)
Calcium: 8.4 mg/dL — ABNORMAL LOW (ref 8.9–10.3)
Chloride: 109 mmol/L (ref 101–111)
Creatinine, Ser: 0.72 mg/dL (ref 0.61–1.24)
GFR calc Af Amer: >60 mL/min (ref >60)
GFR calc non Af Amer: >60 mL/min (ref >60)
GLUCOSE: 106 mg/dL — AB (ref 65–99)
POTASSIUM: 4.1 mmol/L (ref 3.5–5.1)
Sodium: 139 mmol/L (ref 135–145)

## 2015-04-15 NOTE — Care Management (Addendum)
Patient concerned with HHPT cost $33/visit and wants me to compare to OPPT cost. Message left for Peggy at [facility] [phone]. Rolling walker has been delivered- insurance covered all but $12. Cost to patient will be 30% until deductible regardless of place of service until he meets his out of pocket. Patient aware and wants to go to OPPT. I spoke with Vickii Chafe at [facility]- appointment made for OPPT at [facility] on 2/8 at 0800AM.

## 2015-04-15 NOTE — Progress Notes (Signed)
Outpatient PT Benefits:  Number called: [phone] Rep: Zenia Resides Reference Number: [id number]  [facility] Consumer Choice PPO plan active since 03/13/14.   Individual deductible is $2250 and out of pocket max is $4850.  $832.20 met towards both amounts.  HRA on plan has been maxxed out for the calendar year.    In-network Outpatient PT - responsible for a 30% co-insurance after $2250 deductible is met.  Per rep, no visit limit.  Josem Kaufmann is required beginning on day 31 of service.  Patient expected to show improvement within 60 days.   Number for Josem Kaufmann is [phone].

## 2015-04-15 NOTE — Progress Notes (Signed)
Patient have been having some confusion throughout the night, will continue to monitor.

## 2015-04-15 NOTE — Progress Notes (Signed)
Physical Therapy Treatment Patient Details Name: Eric Rose MRN: [id number] DOB: Jan 27, 1956 Today's Date: 04/15/2015    History of Present Illness      PT Comments    Pt progressing well. Requires increased cueing/education on posterior hip precautions and applying to functional mobility. Pt requires no physical assist for functional mobility; Min guard only with initial stand. Partial time overlap with OT, for dressing in sit and stand with education on adhering to posterior hip precautions and left PWB status Demonstrates good compliance with left PWB with ambulation. Pt has many questions regarding ability to ambulate in room on his own/with family. Increased time educating pt on safety while in hospital and proper procedures/persons to contact for requesting assist. Pt states he understands. Pt received up in chair and encouraged to remain so as long as tolerated. Plan to see pt this afternoon for continued ambulation, safety education and lower extremity exercises.   Follow Up Recommendations  Home health PT     Equipment Recommendations  Rolling walker with 5" wheels (received already)    Recommendations for Other Services       Precautions / Restrictions Precautions Precautions: Posterior Hip;Fall Restrictions Weight Bearing Restrictions: Yes LLE Weight Bearing: Partial weight bearing LLE Partial Weight Bearing Percentage or Pounds: 50%    Mobility  Bed Mobility Overal bed mobility: Needs Assistance Bed Mobility: Supine to Sit     Supine to sit: Supervision (increased time; requires consistent cue for hip precautions)     General bed mobility comments: performs without physical assist with increased time/effort. Requires consistent cueing to adhere to posterior hip precaution for hip flexion  Transfers Overall transfer level: Needs assistance Equipment used: Rolling walker (2 wheeled) Transfers: Sit to/from Stand Sit to Stand: Min guard (instruction for  PWB)         General transfer comment: Transfers well; understands 50% weightbear with static stand versus dynamic stand/weightshifting  Ambulation/Gait Ambulation/Gait assistance: Supervision Ambulation Distance (Feet): 95 Feet Assistive device: Rolling walker (2 wheeled) Gait Pattern/deviations: Step-through pattern;Decreased step length - right;Decreased stance time - left;Antalgic (good use of UEs to adhere to [facility] status on L) Gait velocity: reduced Gait velocity interpretation: Below normal speed for age/gender General Gait Details: Pt ambulates well with good sequence, and use of UEs; pt does require cueing to avoid toe in on the L with turns   Stairs            Wheelchair Mobility    Modified Rankin (Stroke Patients Only)       Balance Overall balance assessment: Needs assistance         Standing balance support: Bilateral upper extremity supported Standing balance-Leahy Scale: Fair                      Cognition Arousal/Alertness: Awake/alert Behavior During Therapy: WFL for tasks assessed/performed;Anxious;Impulsive Overall Cognitive Status: Within Functional Limits for tasks assessed       Memory: Decreased recall of precautions (increased cueing to adhere to precautions hip flexion/toe in)              Exercises Total Joint Exercises Ankle Circles/Pumps: AROM;Both;Other reps (comment);Seated (30 reps; instructed to perform every hour)    General Comments        Pertinent Vitals/Pain Pain Assessment: 0-10 Pain Score: 5  Pain Location: L hip Pain Descriptors / Indicators: Aching Pain Intervention(s): Monitored during session;Repositioned (Requesting pain medication post session)    Home Living  Prior Function            PT Goals (current goals can now be found in the care plan section) Progress towards PT goals: Progressing toward goals    Frequency  BID    PT Plan Current plan remains  appropriate    Co-evaluation             End of Session   Activity Tolerance: Patient tolerated treatment well;No increased pain (requesting pain medication post session) Patient left: in chair;with call bell/phone within reach;with chair alarm set;with SCD's reapplied (increased instruction on calling for assist to get up)     Time: 0927-1008 PT Time Calculation (min) (ACUTE ONLY): 41 min  Charges:  $Gait Training: 8-22 mins $Therapeutic Activity: 23-37 mins                    G Codes:      Charlaine Dalton 04/15/2015, 10:23 AM

## 2015-04-15 NOTE — Progress Notes (Signed)
Clinical Social Worker (CSW) received SNF consult. PT is recommending home health. RN Case Manager is aware of above. Please reconsult if future social work needs arise. CSW signing off.   Makenlee Mckeag Morgan, LCSW [phone] 

## 2015-04-15 NOTE — Anesthesia Postprocedure Evaluation (Signed)
Anesthesia Post Note  Patient: Eric Rose  Procedure(s) Performed: Procedure(s) (LRB): TOTAL HIP ARTHROPLASTY (Left)  Patient location during evaluation: Nursing Unit Anesthesia Type: Spinal Level of consciousness: awake and alert Pain management: pain level controlled Vital Signs Assessment: vitals unstable and post-procedure vital signs reviewed and stable Respiratory status: nonlabored ventilation and spontaneous breathing Cardiovascular status: blood pressure returned to baseline and stable Postop Assessment: no headache, adequate PO intake and no signs of nausea or vomiting Anesthetic complications: no    Last Vitals:  Filed Vitals:   04/14/15 1943 04/15/15 0517  BP: 121/73 116/72  Pulse: 81 75  Temp: 37.6 C 36.8 C  Resp: 18 20    Last Pain:  Filed Vitals:   04/15/15 0545  PainSc: Asleep                 Dallie Dad

## 2015-04-15 NOTE — Evaluation (Signed)
Occupational Therapy Evaluation Patient Details Name: Eric Rose MRN: [id number] DOB: 01/16/1956 Today's Date: 04/15/2015    History of Present Illness Patient is a pleasant 60 y/o male that presents for L THR on 04/14/2015 (posterior approach) . 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    Clinical Impression   This patient is a 60 year old male who came to [facility] for a L total hip replacement (posterior approach).  Patient lives with his wife in a 2 story home with  steps to enter and bilateral rail. He can live on 1st floor.  ** had been independent with ADL and functional mobility. He has deficits with pain, mobility and activities of daily living and now requires assistance. He would benefit from Occupational Therapy for ADL/functional mobility training while .staying within hip precautions (posterior approach).      Follow Up Recommendations   ( home with home health Physical Therapy may consider Occupational Therapy as he needs considerable cues to stay within hip precautions during activities of daily living and mobility)    Equipment Recommendations       Recommendations for Other Services       Precautions / Restrictions Precautions Precautions: Posterior Hip;Fall Restrictions Weight Bearing Restrictions: Yes LLE Weight Bearing: Partial weight bearing LLE Partial Weight Bearing Percentage or Pounds: 50%      Mobility Bed Mobility Overal bed mobility: Needs Assistance Bed Mobility: Supine to Sit     Supine to sit: Supervision     General bed mobility comments: Significant cues to stay with in hip precautions.  Transfers            Balance Overall balance assessment: Needs assistance         Standing balance support: Bilateral upper extremity supported Standing balance-Leahy Scale: Fair                              ADL                                         General ADL Comments: Had  been independent and working. Today practiced techniques to stay within hip precautions (posterior approach) during lower body dressing. Used hip kit and doffed and donned socks and donned pants with set up, verbal cues and hip kit. Needed considerable verbal cues and physical cues to stay within hip precautions.     Vision     Perception     Praxis      Pertinent Vitals/Pain Pain Assessment: 0-10 Pain Score: 5  Pain Location: L hip Pain Descriptors / Indicators: Aching Pain Intervention(s): Monitored during session;Repositioned (Requesting pain medication post session)     Hand Dominance Right   Extremity/Trunk Assessment Upper Extremity Assessment Upper Extremity Assessment: Overall WFL for tasks assessed   Lower Extremity Assessment Lower Extremity Assessment: Defer to PT evaluation       Communication Communication Communication: No difficulties   Cognition Arousal/Alertness: Awake/alert Behavior During Therapy: WFL for tasks assessed/performed;Anxious;Impulsive Overall Cognitive Status: Within Functional Limits for tasks assessed       Memory: Decreased recall of precautions (Needs significant cues to stay with in hip precautions  (posterior))             General Comments       Exercises Exercises: Total Joint     Shoulder Instructions        Home Living Family/patient expects to be discharged to:: Private residence Living Arrangements: Spouse/significant other Available Help at Discharge: Family;Available PRN/intermittently   Home Access: Stairs to enter Entrance Stairs-Number of Steps: 5 Entrance Stairs-Rails: Can reach both Home Layout: Two level;Able to live on main level with bedroom/bathroom               Home Equipment: Walker - 2 wheels;Cane - single point;Bedside commode;Toilet riser          Prior Functioning/Environment Level of Independence: Independent with assistive device(s)        Comments: single point cane, had been  working.    OT Diagnosis: Acute pain   OT Problem List: Decreased activity tolerance;Impaired balance (sitting and/or standing);Decreased safety awareness;Decreased knowledge of use of DME or AE;Decreased knowledge of precautions;Pain   OT Treatment/Interventions: Self-care/ADL training    OT Goals(Current goals can be found in the care plan section) Acute Rehab OT Goals Patient Stated Goal: To return home safely.  OT Goal Formulation: With patient Time For Goal Achievement: 04/29/15 Potential to Achieve Goals: Good  OT Frequency: Min 1X/week   Barriers to D/C:            Co-evaluation              End of Session Equipment Utilized During Treatment:  (hip kit)  Activity Tolerance:   Patient left: in chair;with call bell/phone within reach;with chair alarm set   Time: 0939-0959 OT Time Calculation (min): 20 min Charges:  OT General Charges $OT Visit: 1 Procedure OT Evaluation $OT Eval Low Complexity: 1 Procedure OT Treatments $Self Care/Home Management : 8-22 mins G-Codes:    ,  M  M , MS/OTR/L  04/15/2015, 11:06 AM    

## 2015-04-15 NOTE — Progress Notes (Signed)
Subjective: 1 Day Post-Op Procedure(s) (LRB): TOTAL HIP ARTHROPLASTY (Left)    Patient reports pain as mild. Has walked with therapy this morning. Drain is removed. Hemoglobin is stable. Dressing is dry.  Objective:   VITALS:   Filed Vitals:   04/15/15 0902 04/15/15 0927  BP: 120/67   Pulse: 99 96  Temp: 98.6 F (37 C)   Resp: 18     Neurologically intact Neurovascular intact Sensation intact distally Intact pulses distally Dorsiflexion/Plantar flexion intact  LABS  Recent Labs  04/14/15 1339 04/15/15 0526  HGB 12.5* 12.0*  HCT 38.0* 35.9*  WBC 9.7 9.5  PLT 220 199     Recent Labs  04/14/15 1339 04/15/15 0526  NA  --  139  K  --  4.1  BUN  --  8  CREATININE 0.81 0.72  GLUCOSE  --  106*    No results for input(s): LABPT, INR in the last 72 hours.   Assessment/Plan: 1 Day Post-Op Procedure(s) (LRB): TOTAL HIP ARTHROPLASTY (Left)   Advance diet Up with therapy D/C IV fluids Plan for discharge tomorrow

## 2015-04-15 NOTE — Progress Notes (Signed)
Physical Therapy Treatment Patient Details Name: Eric Rose MRN: [id number] DOB: 19-Mar-1955 Today's Date: 04/15/2015    History of Present Illness Patient is a pleasant 60 y/o male that presents for L THR on 04/14/2015. 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     PT Comments    Pt eager for PT. Pt mildly impulsive and stands without therapist being ready or deactivating chair alarm. Discussion again with pt and spouse regarding supervision/assist with ambulation in the hospital. Pt/spouse understand. Pt continues to require cues for posterior hip precautions with sit to/from stand. Pt progressing ambulation and initiated stair climbing today. Pt/spouse instructed in long sit exercises for left lower extremity; performs well. Continue PT to progress active range of motion, strength, transfers, ambulation and safety with all functional mobility.   Follow Up Recommendations  Home health PT     Equipment Recommendations  Rolling walker with 5" wheels    Recommendations for Other Services       Precautions / Restrictions Precautions Precautions: Posterior Hip;Fall Restrictions Weight Bearing Restrictions: Yes LLE Weight Bearing: Partial weight bearing LLE Partial Weight Bearing Percentage or Pounds: 50%    Mobility  Bed Mobility         Supine to sit: Supervision     General bed mobility comments: Not tested; pt continues up in chair and wishes to remain in chair  Transfers Overall transfer level: Needs assistance Equipment used: Rolling walker (2 wheeled) Transfers: Sit to/from Stand Sit to Stand: Supervision         General transfer comment: Cues for LLE position to adhere to posterior hip precautions both with stand and sit  Ambulation/Gait Ambulation/Gait assistance: Supervision Ambulation Distance (Feet): 170 Feet Assistive device: Rolling walker (2 wheeled) Gait Pattern/deviations: Step-through pattern;Decreased stance time - left;Decreased step length  - right (Good compliance with PWB L) Gait velocity: reduced Gait velocity interpretation: Below normal speed for age/gender General Gait Details: Ambulates with good speed, and reciprocal pattern with R step slightly less. Cues occasionally to avoid toe in on the L especially with turns.    Stairs Stairs: Yes Stairs assistance: Min guard Stair Management: Two rails;Forwards Number of Stairs: 4 General stair comments: Cues instruction for sequence; pt mildly impulsive and attempts to step down initially with L foot, but quickly corrects  Wheelchair Mobility    Modified Rankin (Stroke Patients Only)       Balance           Standing balance support: Bilateral upper extremity supported Standing balance-Leahy Scale: Fair                      Cognition Arousal/Alertness: Awake/alert Behavior During Therapy: WFL for tasks assessed/performed;Anxious;Impulsive Overall Cognitive Status: Within Functional Limits for tasks assessed       Memory: Decreased recall of precautions              Exercises Total Joint Exercises Ankle Circles/Pumps: AROM;Both;20 reps (long sit) Quad Sets: Strengthening;Both;20 reps (long sit) Gluteal Sets: Strengthening;Both;20 reps (long sit) Towel Squeeze: Strengthening;Both;20 reps (long sit) Short Arc Quad: AROM;Both;20 reps (long sit) Heel Slides: AAROM;Left;20 reps (long sit, partialy range) Hip ABduction/ADduction: AAROM;Left;20 reps (long sit) Straight Leg Raises: AAROM;Left;10 reps (long sit)    General Comments        Pertinent Vitals/Pain Pain Assessment: 0-10 Pain Score: 4  Pain Location: L hip Pain Descriptors / Indicators: Aching Pain Intervention(s): Monitored during session;Premedicated before session    Home Living Family/patient expects to be  discharged to:: Private residence Living Arrangements: Spouse/significant other Available Help at Discharge: Family;Available PRN/intermittently   Home Access: Stairs  to enter Entrance Stairs-Rails: Can reach both Home Layout: Two level;Able to live on main level with bedroom/bathroom Home Equipment: Gilford Rile - 2 wheels;Cane - single point;Bedside commode;Toilet riser      Prior Function Level of Independence: Independent with assistive device(s)      Comments: single point cane, had been working.   PT Goals (current goals can now be found in the care plan section) Acute Rehab PT Goals Patient Stated Goal: To return home safely.  Progress towards PT goals: Progressing toward goals    Frequency  BID    PT Plan Current plan remains appropriate    Co-evaluation             End of Session Equipment Utilized During Treatment: Gait belt Activity Tolerance: Patient tolerated treatment well;No increased pain Patient left: in chair;with call bell/phone within reach;with chair alarm set;with family/visitor present;with SCD's reapplied     Time: [id number] PT Time Calculation (min) (ACUTE ONLY): 28 min  Charges:  $Gait Training: 8-22 mins $Therapeutic Exercise: 8-22 mins                    G Codes:      Charlaine Dalton 04/15/2015, 2:41 PM

## 2015-04-16 LAB — CBC
HCT: 34.5 % — ABNORMAL LOW (ref 40.0–52.0)
Hemoglobin: 11.4 g/dL — ABNORMAL LOW (ref 13.0–18.0)
MCH: 30.3 pg (ref 26.0–34.0)
MCHC: 33.1 g/dL (ref 32.0–36.0)
MCV: 91.6 fL (ref 80.0–100.0)
PLATELETS: 187 10*3/uL (ref 150–440)
RBC: 3.77 MIL/uL — AB (ref 4.40–5.90)
RDW: 13.2 % (ref 11.5–14.5)
WBC: 10.2 10*3/uL (ref 3.8–10.6)

## 2015-04-16 LAB — SURGICAL PATHOLOGY

## 2015-04-16 MED ORDER — GABAPENTIN 400 MG PO CAPS: 400.0000 mg | ORAL_CAPSULE | Freq: Three times a day (TID) | ORAL | Status: DC

## 2015-04-16 MED ORDER — HYDROCODONE-ACETAMINOPHEN 7.5-325 MG PO TABS: 1.0000 | ORAL_TABLET | Freq: Four times a day (QID) | ORAL | Status: DC | PRN

## 2015-04-16 MED ORDER — ASPIRIN EC 325 MG PO TBEC
325.0000 mg | DELAYED_RELEASE_TABLET | Freq: Two times a day (BID) | ORAL | Status: DC
Start: 2015-04-16 — End: 2015-08-06

## 2015-04-16 NOTE — Progress Notes (Signed)
Completed discharge instructions with patient and wife.  No complaints or questions.  Discharged home with walker.

## 2015-04-16 NOTE — Progress Notes (Signed)
Physical Therapy Treatment Patient Details Name: Eric Rose MRN: [id number] DOB: [birth year] Today's Date: 04/16/2015    History of Present Illness Patient is a pleasant 60 y/o male that presents for L THR on 04/14/2015. 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     PT Comments    Pt is able to ambulate around the nurses station with walker and shows improved speed and confidence.  He is able to negotiate up/down steps with single rail and ultimately is progressing at or above expected post-op levels.   Follow Up Recommendations  Home health PT     Equipment Recommendations  Rolling walker with 5" wheels    Recommendations for Other Services       Precautions / Restrictions Precautions Precautions: Posterior Hip;Fall Restrictions Weight Bearing Restrictions: Yes LLE Weight Bearing: Partial weight bearing    Mobility  Bed Mobility Overal bed mobility: Needs Assistance Bed Mobility: Supine to Sit     Supine to sit: Min guard     General bed mobility comments: Pt able to get up to EOB with hand rail uses and cuing.  Transfers Overall transfer level: Needs assistance Equipment used: Rolling walker (2 wheeled) Transfers: Sit to/from Stand Sit to Stand: Supervision         General transfer comment: Pt shows good effort with getting to standing, no hesitation or unsteadiness  Ambulation/Gait Ambulation/Gait assistance: Supervision Ambulation Distance (Feet): 250 Feet Assistive device: Rolling walker (2 wheeled)       General Gait Details: Pt starts somewhat slowly, but after ~50 ft is able to maintain consistent walker momentum with increased speed and confidence.  .   Stairs Stairs: Yes Stairs assistance: Supervision Stair Management: One rail Left Number of Stairs: 4 General stair comments: Pt able to negotiate up/down stairswith single rail side step strategy  Wheelchair Mobility    Modified Rankin (Stroke Patients Only)       Balance                                    Cognition Arousal/Alertness: Awake/alert Behavior During Therapy: WFL for tasks assessed/performed Overall Cognitive Status: Within Functional Limits for tasks assessed                      Exercises Total Joint Exercises Ankle Circles/Pumps: AROM;Both;20 reps Quad Sets: Strengthening;Both;20 reps Gluteal Sets: Strengthening;Both;20 reps Heel Slides: Left;20 reps;AROM Hip ABduction/ADduction: Left;20 reps;AROM Straight Leg Raises: AROM;Left;10 reps    General Comments        Pertinent Vitals/Pain Pain Assessment: 0-10 Pain Score: 3     Home Living                      Prior Function            PT Goals (current goals can now be found in the care plan section) Progress towards PT goals: Progressing toward goals    Frequency  BID    PT Plan Current plan remains appropriate    Co-evaluation             End of Session Equipment Utilized During Treatment: Gait belt Activity Tolerance: Patient tolerated treatment well;No increased pain Patient left: in chair;with call bell/phone within reach;with chair alarm set;with family/visitor present;with SCD's reapplied     Time: [id number] PT Time Calculation (min) (ACUTE ONLY): 25 min  Charges:  $Gait Training: 8-22 mins $Therapeutic  Exercise: 8-22 mins                    G Codes:     Wayne Both, [place], DPT [phone]  Kreg Shropshire 04/16/2015, 10:38 AM

## 2015-04-16 NOTE — Progress Notes (Signed)
Occupational Therapy Treatment Patient Details Name: Eric Rose MRN: [id number] DOB: [birth year] Today's Date: 04/16/2015    History of present illness Patient is a pleasant 60 y/o male that presents for L THR on 04/14/2015. 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    OT comments  Patient fully dressed upon arrival.   Follow Up Recommendations       Equipment Recommendations       Recommendations for Other Services      Precautions / Restrictions Precautions Precautions: Posterior Hip;Fall Restrictions LLE Weight Bearing: Partial weight bearing       Mobility Bed Mobility                  Transfers                      Balance                                   ADL  Patient fully dressed upon arrival. Reviewed hip precautions (posterior approach) and using hip kit to stay within hip precautions. Reviewed using reacher to donn and doff pants and doff socks, and sock aid to donn socks, shoe horn to donn shoes and elastic shoe laces, and long handle shoe horn. Issued list of vendors who carry hip kit.                                               Vision                     Perception     Praxis      Cognition   Behavior During Therapy: WFL for tasks assessed/performed Overall Cognitive Status: Within Functional Limits for tasks assessed       Memory: Decreased recall of precautions               Extremity/Trunk Assessment               Exercises     Shoulder Instructions       General Comments      Pertinent Vitals/ Pain       Pain Score: 3  Pain Location: L hip  Home Living Family/patient expects to be discharged to:: Private residence Living Arrangements: Spouse/significant other Available Help at Discharge: Family;Available PRN/intermittently Type of Home: House Home Access: Stairs to enter [employer] of Steps: 5 Entrance Stairs-Rails: Can reach both                             Prior Functioning/Environment              Frequency       Progress Toward Goals  OT Goals(current goals can now be found in the care plan section)        Plan      Co-evaluation                 End of Session Equipment Utilized During Treatment:  (hip kit)   Activity Tolerance     Patient Left     Nurse Communication          Time: 5537-4827 OT Time Calculation (min): 12 min  Charges: OT General Charges $  OT Visit: 1 Procedure OT Treatments $Self Care/Home Management : 8-22 mins Sharon Mt, MS/OTR/L  Sharon Mt 04/16/2015, 2:12 PM

## 2015-04-16 NOTE — Progress Notes (Signed)
Subjective: 2 Days Post-Op Procedure(s) (LRB): TOTAL HIP ARTHROPLASTY (Left)    Patient reports pain as mild. Doing well with PT and ready to go home.  Dressing dry.  Objective:   VITALS:   Filed Vitals:   04/16/15 0447 04/16/15 0759  BP: 105/68 112/81  Pulse: 87 82  Temp: 98 F (36.7 C) 97.7 F (36.5 C)  Resp: 20 18    Neurologically intact Neurovascular intact Sensation intact distally Intact pulses distally Dorsiflexion/Plantar flexion intact Incision: no drainage  LABS  Recent Labs  04/14/15 1339 04/15/15 0526 04/16/15 0437  HGB 12.5* 12.0* 11.4*  HCT 38.0* 35.9* 34.5*  WBC 9.7 9.5 10.2  PLT 220 199 187     Recent Labs  04/14/15 1339 04/15/15 0526  NA  --  139  K  --  4.1  BUN  --  8  CREATININE 0.81 0.72  GLUCOSE  --  106*    No results for input(s): LABPT, INR in the last 72 hours.   Assessment/Plan:  D/C today to home. Start OPPT next week RTC 10 days

## 2015-04-16 NOTE — Discharge Summary (Signed)
Physician Discharge Summary  Patient ID: CODDY MANSIR MRN: [id number] DOB/AGE: 06-07-1955 60 y.o.  Admit date: 04/14/2015 Discharge date: 04/16/2015  Admission Diagnoses: Left hip arthritis  Discharge Diagnoses: Same Active Problems:   S/P total hip arthroplasty   Discharged Condition: good  Hospital Course: Patient underwent left THR on 0000000 without complication. Did very well with stable hgb.  Good progress with PT.  Ready for discharge today.  To start OPPT at my office next week.   Consults: None  Significant Diagnostic Studies: labs:    Treatments: therapies: PT  Discharge Exam: Blood pressure 112/81, pulse 82, temperature 97.7 F (36.5 C), temperature source Oral, resp. rate 18, height 6' (1.829 m), weight 83.008 kg (183 lb), SpO2 99 %. Extremities: Homans sign is negative, no sign of DVT, no edema, redness or tenderness in the calves or thighs and CSM good.  Hip stable.  Leg lengths equal.   Disposition:   Discharge Instructions    Call MD for:  persistant nausea and vomiting    Complete by:  As directed      Call MD for:  redness, tenderness, or signs of infection (pain, swelling, redness, odor or green/yellow discharge around incision site)    Complete by:  As directed      Call MD for:  severe uncontrolled pain    Complete by:  As directed      Call MD for:  temperature >100.4    Complete by:  As directed      Diet - low sodium heart healthy    Complete by:  As directed      Discharge instructions    Complete by:  As directed   Partial weight left leg Keep dressing dry Start PT next week RTC 10 days     Increase activity slowly    Complete by:  As directed      Leave dressing on - Keep it clean, dry, and intact until clinic visit    Complete by:  As directed             Medication List    STOP taking these medications        aspirin 81 MG tablet  Replaced by:  aspirin EC 325 MG tablet      TAKE these medications        aspirin EC 325 MG  tablet  Take 1 tablet (325 mg total) by mouth 2 (two) times daily.     diclofenac 75 MG EC tablet  Commonly known as:  VOLTAREN  Take 75 mg by mouth 2 (two) times daily.     dutasteride 0.5 MG capsule  Commonly known as:  AVODART  Take 1 capsule (0.5 mg total) by mouth daily.     Dutasteride-Tamsulosin HCl 0.5-0.4 MG Caps  Take 1 capsule by mouth daily.     Fish Oil Oil  by Does not apply route.     gabapentin 400 MG capsule  Commonly known as:  NEURONTIN  Take 1 capsule (400 mg total) by mouth 3 (three) times daily.     GLUCOSAMINE CHONDR COMPLEX PO  Take by mouth.     HYDROcodone-acetaminophen 7.5-325 MG tablet  Commonly known as:  NORCO  Take 1-2 tablets by mouth every 6 (six) hours as needed for moderate pain.     lisinopril 20 MG tablet  Commonly known as:  PRINIVIL,ZESTRIL  Take 20 mg by mouth daily.     tadalafil 20 MG tablet  Commonly known as:  CIALIS  Take 1 tablet (20 mg total) by mouth daily as needed for erectile dysfunction.     CIALIS 5 MG tablet  Generic drug:  tadalafil  TAKE ONE TABLET BY MOUTH ONCE DAILY AS NEEDED     VITAMIN D (ERGOCALCIFEROL) PO  Take by mouth.     VOLTAREN 1 % Gel  Generic drug:  diclofenac sodium  APPLY TOPICALLY THREE TIMES DAILY           Follow-up Information    Follow up with [facility] PT appointment On 04/21/2015.   Why:  0800AM      Follow up with Park Breed, MD On 04/23/2015.   Specialty:  Specialist   Why:  AT 11:00 AM   Contact information:   [place] [address] [phone]       Schedule an appointment as soon as possible for a visit with Park Breed, MD.   Specialty:  Specialist   Why:  For wound re-check   Contact information:   [place] [place] [address] [phone]       Signed: Park Breed 04/16/2015, 2:10 PM

## 2015-04-20 ENCOUNTER — Ambulatory Visit: Payer: BLUE CROSS/BLUE SHIELD | Admitting: Family Medicine

## 2015-05-06 ENCOUNTER — Telehealth: Payer: Self-pay | Admitting: Radiology

## 2015-05-06 NOTE — Telephone Encounter (Signed)
Pt called asking about his PSA results. He says he has been unable to come to his f/u appts. He asks for someone to call him back. Phone # is [phone] or [phone].

## 2015-05-07 NOTE — Telephone Encounter (Signed)
Pt notified of PSA reduction & to continue taking Jalyn.  Appt made for 3 month f/u & for lab appt. Pt voices understanding.

## 2015-05-07 NOTE — Telephone Encounter (Signed)
LMOM

## 2015-05-07 NOTE — Telephone Encounter (Signed)
Patient's PSA has reduced significantly. I want him to continue the [place]. I would like to see him again in 3 months. I cannot make an exception.  He will need an exam, PSA and an IPSS score.

## 2015-05-11 ENCOUNTER — Telehealth: Payer: Self-pay

## 2015-05-11 DIAGNOSIS — R972 Elevated prostate specific antigen [PSA]: Secondary | ICD-10-CM

## 2015-05-11 MED ORDER — DUTASTERIDE-TAMSULOSIN HCL 0.5-0.4 MG PO CAPS: 1.0000 | ORAL_CAPSULE | Freq: Every day | ORAL | Status: DC

## 2015-05-11 NOTE — Telephone Encounter (Signed)
Spoke with pt in reference to PSA and Jalyn. Pt voiced understanding.

## 2015-05-11 NOTE — Telephone Encounter (Signed)
Pt called stating he only has advodart and not jalyn or tamsulosin alone. Therefore jalyn refill was given.

## 2015-05-11 NOTE — Telephone Encounter (Signed)
-----   Message from Nori Riis, PA-C sent at 05/06/2015  1:04 PM EST ----- Patient's PSA has reduced significantly.  I want him to continue the [place].  I would like to see him again in 3 months.  I cannot make an exception.   He will need an exam, PSA and an IPSS score.

## 2015-05-16 ENCOUNTER — Other Ambulatory Visit: Payer: Self-pay | Admitting: Family Medicine

## 2015-07-13 ENCOUNTER — Ambulatory Visit: Payer: BLUE CROSS/BLUE SHIELD | Admitting: Family Medicine

## 2015-07-19 ENCOUNTER — Telehealth: Payer: Self-pay

## 2015-07-19 NOTE — Telephone Encounter (Signed)
Yes. He was restarting medication today.

## 2015-07-19 NOTE — Telephone Encounter (Signed)
This is good to know.  Is he planning on restarting the medication?

## 2015-07-19 NOTE — Telephone Encounter (Signed)
Pt called stating he read on his FYI sheets attached to his dutasteride that if you have been off of medication for over a week to consult your physician. Pt wanted to make you aware he has not taken medication in 3 weeks. Please advise if need be.

## 2015-07-28 ENCOUNTER — Other Ambulatory Visit: Payer: BLUE CROSS/BLUE SHIELD

## 2015-07-28 ENCOUNTER — Encounter: Payer: Self-pay | Admitting: Urology

## 2015-07-29 ENCOUNTER — Other Ambulatory Visit: Payer: BLUE CROSS/BLUE SHIELD

## 2015-07-29 DIAGNOSIS — R972 Elevated prostate specific antigen [PSA]: Secondary | ICD-10-CM

## 2015-07-30 LAB — PSA: PROSTATE SPECIFIC AG, SERUM: 3.5 ng/mL (ref 0.0–4.0)

## 2015-08-04 ENCOUNTER — Encounter: Payer: Self-pay | Admitting: Urology

## 2015-08-04 ENCOUNTER — Ambulatory Visit: Payer: BLUE CROSS/BLUE SHIELD | Admitting: Urology

## 2015-08-06 ENCOUNTER — Encounter: Payer: Self-pay | Admitting: Family Medicine

## 2015-08-06 ENCOUNTER — Ambulatory Visit (INDEPENDENT_AMBULATORY_CARE_PROVIDER_SITE_OTHER): Payer: BLUE CROSS/BLUE SHIELD | Admitting: Family Medicine

## 2015-08-06 VITALS — BP 110/82 | HR 90 | Temp 98.5°F | Resp 16 | Wt 187.0 lb

## 2015-08-06 DIAGNOSIS — N401 Enlarged prostate with lower urinary tract symptoms: Secondary | ICD-10-CM

## 2015-08-06 DIAGNOSIS — N529 Male erectile dysfunction, unspecified: Secondary | ICD-10-CM

## 2015-08-06 DIAGNOSIS — D649 Anemia, unspecified: Secondary | ICD-10-CM

## 2015-08-06 DIAGNOSIS — R972 Elevated prostate specific antigen [PSA]: Secondary | ICD-10-CM

## 2015-08-06 DIAGNOSIS — N138 Other obstructive and reflux uropathy: Secondary | ICD-10-CM

## 2015-08-06 DIAGNOSIS — Z13220 Encounter for screening for lipoid disorders: Secondary | ICD-10-CM | POA: Diagnosis not present

## 2015-08-06 DIAGNOSIS — I10 Essential (primary) hypertension: Secondary | ICD-10-CM

## 2015-08-06 MED ORDER — TADALAFIL 5 MG PO TABS: 2.5000 mg | ORAL_TABLET | Freq: Every day | ORAL | Status: DC | PRN

## 2015-08-06 MED ORDER — LISINOPRIL 20 MG PO TABS: 20.0000 mg | ORAL_TABLET | Freq: Every day | ORAL | Status: DC

## 2015-08-06 NOTE — Patient Instructions (Addendum)
Your goal blood pressure is less than 150 mmHg on top. Try to follow the DASH guidelines (DASH stands for Dietary Approaches to Stop Hypertension) Try to limit the sodium in your diet.  Ideally, consume less than 1.5 grams (less than 1,500mg ) per day. Do not add salt when cooking or at the table.  Check the sodium amount on labels when shopping, and choose items lower in sodium when given a choice. Avoid or limit foods that already contain a lot of sodium. Eat a diet rich in fruits and vegetables and whole grains.  Have labs done today please  If you have not heard anything from my staff in a week about any orders/referrals/studies from today, please contact us here to follow-up [phone]

## 2015-08-06 NOTE — Progress Notes (Signed)
BP 110/82 mmHg  Pulse 90  Temp(Src) 98.5 F (36.9 C) (Oral)  Resp 16  Wt 187 lb (84.823 kg)  SpO2 96%   Subjective:    Patient ID: Eric Rose, male    DOB: 1955-07-20, 60 y.o.   MRN: [id number]  HPI: Eric Rose is a 60 y.o. male  Chief Complaint  Patient presents with  . Medication Refill   Patient is new to me; his usual provider is unavailable temporarily, so I am seeing him for regular follow-up  Since last visit: Left hip replacement, s/p rehab at the office; labs reviewed, H/H 13.9 in January; dropped with surgery to 12.5/38.0, then 12.0/35.9, and finally 11.4/34.5 on April 16, 2015 Right hip replacement 1.5 years ago No complications, no fevers  HTN for 20 years; runs in the family; checks BP away from her; 120/70s or 80s; highest is 125-130 Used to salt food, but not now Likes nuts that are lightly salted No decongestants No black licorice  No smoking Might snore but a little bit; rested in the morning Pretty active; works at a [employer] reviewed; taking vit D 1000 iu daily Previous labs reviewed; low calcium score; pt did not seem aware of this; it was 8.8 on Mar 31, 2015 and had dropped to 8.4 on Apr 15, 2015 (normal 8.9 to 10.3)  Enlarged prostate, runs in the family; only using cialis when needed, not daily; sees urologist (upstairs in our building); he has a hx of elevated PSA; 7 months ago, PSA was 4.8; it dropped to 3.6, then 2.5, and most recently 3.5 on May 18th  Depression screen [facility] 2/9 08/06/2015 12/15/2014  Decreased Interest 0 0  Down, Depressed, Hopeless 1 0  PHQ - 2 Score 1 0   Relevant past medical, surgical, family and social history reviewed Past Medical History  Diagnosis Date  . Hypertension   . Lumbago   . Decreased libido   . Allergy   . Anxiety   . GERD (gastroesophageal reflux disease)   . BPH (benign prostatic hypertrophy) with urinary obstruction     followed by urologist  . History of pneumonia     . Erectile dysfunction 08/08/2015   Past Surgical History  Procedure Laterality Date  . Joint replacement Right     hip  . Total hip arthroplasty Left 04/14/2015    Procedure: TOTAL HIP ARTHROPLASTY;  Surgeon: Earnestine Leys, MD;  Location: [facility] ORS;  Service: Orthopedics;  Laterality: Left;   Family History  Problem Relation Age of Onset  . Diabetes Mother   . Heart disease Mother   . Alzheimer's disease Mother    Social History  Substance Use Topics  . Smoking status: Never Smoker   . Smokeless tobacco: Never Used  . Alcohol Use: No   Interim medical history since last visit reviewed. Allergies and medications reviewed  Review of Systems Per HPI unless specifically indicated above     Objective:    BP 110/82 mmHg  Pulse 90  Temp(Src) 98.5 F (36.9 C) (Oral)  Resp 16  Wt 187 lb (84.823 kg)  SpO2 96%  Wt Readings from Last 3 Encounters:  08/06/15 187 lb (84.823 kg)  04/14/15 183 lb (83.008 kg)  03/31/15 183 lb (83.008 kg)    Physical Exam  Constitutional: He appears well-developed and well-nourished. No distress.  Looks much younger than stated years, perhaps early 46s  HENT:  Head: Normocephalic and atraumatic.  Eyes: EOM are normal. No scleral icterus.  Neck: No thyromegaly present.  Cardiovascular: Normal rate and regular rhythm.   Pulmonary/Chest: Effort normal and breath sounds normal.  Abdominal: Soft. Bowel sounds are normal. He exhibits no distension.  Musculoskeletal: He exhibits no edema.  Neurological: Coordination normal.  Skin: Skin is warm and dry. No pallor.  Psychiatric: He has a normal mood and affect. His behavior is normal. Judgment and thought content normal.      Assessment & Plan:   Problem List Items Addressed This Visit      Cardiovascular and Mediastinum   Essential hypertension, benign - Primary    DASH guidelines encouraged; continue ACE-I; monitor creatinine and K+ on med      Relevant Medications   tadalafil (CIALIS) 5 MG  tablet   lisinopril (PRINIVIL,ZESTRIL) 20 MG tablet     Genitourinary   BPH with obstruction/lower urinary tract symptoms    followed by urologist; last several PSA readings reviewed      Erectile dysfunction    Patient requested refill of ED medicine        Other   Anemia    Appears to have been related to hip surgery; recheck      Relevant Orders   CBC with Differential/Platelet (Completed)   Elevated PSA    Followed by urologist; last few PSA results reviewed, reduction noted on medicine      Hypocalcemia    Noted on last two BMPs; will recheck, along with phos and vit D; he is already taking 1,000 iu vit D3 daily      Relevant Orders   Basic metabolic panel   VITAMIN D 25 Hydroxy (Vit-D Deficiency, Fractures)   Phosphorus (Completed)   Lipid screening    Check fasting lipid panel      Relevant Orders   Lipid Panel w/o Chol/HDL Ratio (Completed)      Follow up plan: Return 3-6 months, for complete physical.  An after-visit summary was printed and given to the patient at [facility].  Please see the patient instructions which may contain other information and recommendations beyond what is mentioned above in the assessment and plan.  Meds ordered this encounter  Medications  . tadalafil (CIALIS) 5 MG tablet    Sig: Take 0.5-1 tablets (2.5-5 mg total) by mouth daily as needed for erectile dysfunction.    Dispense:  30 tablet    Refill:  11  . lisinopril (PRINIVIL,ZESTRIL) 20 MG tablet    Sig: Take 1 tablet (20 mg total) by mouth daily.    Dispense:  90 tablet    Refill:  1    Orders Placed This Encounter  Procedures  . Basic metabolic panel  . VITAMIN D 25 Hydroxy (Vit-D Deficiency, Fractures)  . Phosphorus  . Lipid Panel w/o Chol/HDL Ratio  . CBC with Differential/Platelet

## 2015-08-07 LAB — CBC WITH DIFFERENTIAL/PLATELET
Basophils Absolute: 0 10*3/uL (ref 0.0–0.2)
Basos: 0 %
EOS (ABSOLUTE): 0.3 10*3/uL (ref 0.0–0.4)
EOS: 5 %
HEMATOCRIT: 41.9 % (ref 37.5–51.0)
HEMOGLOBIN: 14.2 g/dL (ref 12.6–17.7)
Immature Grans (Abs): 0 10*3/uL (ref 0.0–0.1)
Immature Granulocytes: 0 %
LYMPHS ABS: 1.9 10*3/uL (ref 0.7–3.1)
LYMPHS: 28 %
MCH: 30.1 pg (ref 26.6–33.0)
MCHC: 33.9 g/dL (ref 31.5–35.7)
MCV: 89 fL (ref 79–97)
MONOCYTES: 10 %
Monocytes Absolute: 0.7 10*3/uL (ref 0.1–0.9)
NEUTROS ABS: 4 10*3/uL (ref 1.4–7.0)
NEUTROS PCT: 57 %
Platelets: 246 10*3/uL (ref 150–379)
RBC: 4.72 x10E6/uL (ref 4.14–5.80)
RDW: 13.9 % (ref 12.3–15.4)
WBC: 6.9 10*3/uL (ref 3.4–10.8)

## 2015-08-07 LAB — LIPID PANEL W/O CHOL/HDL RATIO
CHOLESTEROL TOTAL: 168 mg/dL (ref 100–199)
HDL: 60 mg/dL (ref >39)
LDL Calculated: 100 mg/dL — ABNORMAL HIGH (ref 0–99)
Triglycerides: 40 mg/dL (ref 0–149)
VLDL Cholesterol Cal: 8 mg/dL (ref 5–40)

## 2015-08-07 LAB — PHOSPHORUS: PHOSPHORUS: 3.8 mg/dL (ref 2.5–4.5)

## 2015-08-08 ENCOUNTER — Encounter: Payer: Self-pay | Admitting: Family Medicine

## 2015-08-08 DIAGNOSIS — N529 Male erectile dysfunction, unspecified: Secondary | ICD-10-CM

## 2015-08-08 HISTORY — DX: Male erectile dysfunction, unspecified: N52.9

## 2015-08-08 NOTE — Assessment & Plan Note (Signed)
Appears to have been related to hip surgery; recheck

## 2015-08-08 NOTE — Assessment & Plan Note (Signed)
Noted on last two BMPs; will recheck, along with phos and vit D; he is already taking 1,000 iu vit D3 daily

## 2015-08-08 NOTE — Assessment & Plan Note (Signed)
followed by urologist; last several PSA readings reviewed

## 2015-08-08 NOTE — Assessment & Plan Note (Signed)
Check fasting lipid panel 

## 2015-08-08 NOTE — Assessment & Plan Note (Signed)
DASH guidelines encouraged; continue ACE-I; monitor creatinine and K+ on med

## 2015-08-08 NOTE — Assessment & Plan Note (Signed)
Followed by urologist; last few PSA results reviewed, reduction noted on medicine

## 2015-08-08 NOTE — Assessment & Plan Note (Signed)
Patient requested refill of ED medicine

## 2015-08-19 ENCOUNTER — Ambulatory Visit: Payer: BLUE CROSS/BLUE SHIELD | Admitting: Urology

## 2015-08-23 ENCOUNTER — Ambulatory Visit: Payer: BLUE CROSS/BLUE SHIELD | Admitting: Urology

## 2015-08-23 ENCOUNTER — Ambulatory Visit (INDEPENDENT_AMBULATORY_CARE_PROVIDER_SITE_OTHER): Payer: BLUE CROSS/BLUE SHIELD | Admitting: Urology

## 2015-08-23 ENCOUNTER — Encounter: Payer: Self-pay | Admitting: Urology

## 2015-08-23 VITALS — BP 123/80 | HR 50 | Ht 72.0 in | Wt 189.0 lb

## 2015-08-23 DIAGNOSIS — R972 Elevated prostate specific antigen [PSA]: Secondary | ICD-10-CM | POA: Diagnosis not present

## 2015-08-23 DIAGNOSIS — N138 Other obstructive and reflux uropathy: Secondary | ICD-10-CM

## 2015-08-23 DIAGNOSIS — N401 Enlarged prostate with lower urinary tract symptoms: Secondary | ICD-10-CM

## 2015-08-23 NOTE — Progress Notes (Signed)
3:53 PM   Eric Rose 1955-03-22 [id number]  Referring provider: Ashok Norris, MD [address] [place], [place] [address]  Chief Complaint  Patient presents with  . Benign Prostatic Hypertrophy    HPI: Patient is a 60 year old (American male who presents today for 3 month follow-up after being restarted on his dutasteride/tamsulosin for an elevation in the PSA.    PSA Elevation Patient was referred to [place] by his primary care physician, Dr. Lucita Lora, for an elevated PSA of 4.8 on 12/29/2014.  Patient had not been seen in our office for the last 2 years.  At that time, he was having difficulty with erections and BPH with LUTS.  His PSA at that time was 4.0.  He was started on Jalyn and a follow-up PSA was 2.0.   His most recent PSA was 3.5 ng/mL on 07/29/2015.    BPH WITH LUTS His IPSS score today is 8, which is moderate lower urinary tract symptomatology. He is mixed with his quality life due to his urinary symptoms.   His major complaint today rare frequency.  He has had these symptoms for over two years.  He denies any dysuria, hematuria or suprapubic pain.   He currently taking Jalyn.  He also denies any recent fevers, chills, nausea or vomiting.   He does not have a family history of PCa.      IPSS      08/23/15 1500       International Prostate Symptom Score   How often have you had the sensation of not emptying your bladder? Less than half the time     How often have you had to urinate less than every two hours? Less than half the time     How often have you found you stopped and started again several times when you urinated? Less than 1 in 5 times     How often have you found it difficult to postpone urination? Less than half the time     How often have you had a weak urinary stream? Not at All     How often have you had to strain to start urination? Not at All     How many times did you typically get up at night to urinate? 1 Time     Total IPSS  Score 8     Quality of Life due to urinary symptoms   If you were to spend the rest of your life with your urinary condition just the way it is now how would you feel about that? Mixed        Score:  1-7 Mild 8-19 Moderate 20-35 Severe   PMH: Past Medical History  Diagnosis Date  . Hypertension   . Lumbago   . Decreased libido   . Allergy   . Anxiety   . GERD (gastroesophageal reflux disease)   . BPH (benign prostatic hypertrophy) with urinary obstruction     followed by urologist  . History of pneumonia   . Erectile dysfunction 08/08/2015    Surgical History: Past Surgical History  Procedure Laterality Date  . Joint replacement Right     hip  . 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  Always  use your most recent med list.               Dutasteride-Tamsulosin HCl 0.5-0.4 MG Caps  Take 1 capsule by mouth daily.     Fish Oil Oil  by Does not apply route.     GLUCOSAMINE CHONDR COMPLEX PO  Take by mouth.     lisinopril 20 MG tablet  Commonly known as:  PRINIVIL,ZESTRIL  Take 1 tablet (20 mg total) by mouth daily.     multivitamin capsule  Take 1 capsule by mouth daily.     tadalafil 5 MG tablet  Commonly known as:  CIALIS  Take 0.5-1 tablets (2.5-5 mg total) by mouth daily as needed for erectile dysfunction.     Vitamin B 12 100 MCG Lozg  Take by mouth.     VITAMIN D (ERGOCALCIFEROL) PO  Take by mouth.        Allergies: No Known Allergies  Family History: Family History  Problem Relation Age of Onset  . Diabetes Mother   . Heart disease Mother   . Alzheimer's disease Mother     Social History:  reports that he has never smoked. He has never used smokeless tobacco. He reports that he does not drink alcohol or use illicit  drugs.  ROS: UROLOGY Frequent Urination?: Yes Hard to postpone urination?: No Burning/pain with urination?: No Get up at night to urinate?: No Leakage of urine?: No Urine stream starts and stops?: No Trouble starting stream?: No Do you have to strain to urinate?: No Blood in urine?: No Urinary tract infection?: No Sexually transmitted disease?: No Injury to kidneys or bladder?: No Painful intercourse?: No Weak stream?: No Erection problems?: Yes Penile pain?: No  Gastrointestinal Nausea?: No Vomiting?: No Indigestion/heartburn?: No Diarrhea?: No Constipation?: No  Constitutional Fever: No Night sweats?: No Weight loss?: No Fatigue?: No  Skin Skin rash/lesions?: No Itching?: No  Eyes Blurred vision?: No Double vision?: No  Ears/Nose/Throat Sore throat?: No Sinus problems?: No  Hematologic/Lymphatic Swollen glands?: No Easy bruising?: No  Cardiovascular Leg swelling?: No Chest pain?: No  Respiratory Cough?: No Shortness of breath?: No  Endocrine Excessive thirst?: No  Musculoskeletal Back pain?: No Joint pain?: No  Neurological Headaches?: No Dizziness?: No  Psychologic Depression?: No Anxiety?: No  Physical Exam: BP 123/80 mmHg  Pulse 50  Ht 6' (1.829 m)  Wt 189 lb (85.73 kg)  BMI 25.63 kg/m2  Constitutional: Well nourished. Alert and oriented, No acute distress. HEENT: [place] AT, moist mucus membranes. Trachea midline, no masses. Cardiovascular: No clubbing, cyanosis, or edema. Respiratory: Normal respiratory effort, no increased work of breathing. GI: Abdomen is soft, non tender, non distended, no abdominal masses. Liver and spleen not palpable.  No hernias appreciated.  Stool sample for occult testing is not indicated.   GU: No CVA tenderness.  No bladder fullness or masses.  Patient with uncircumcised phallus. Foreskin easily retracted  Urethral meatus is patent.  No penile discharge. No penile lesions or rashes. Scrotum without  lesions, cysts, rashes and/or edema.  Testicles are located scrotally bilaterally. No masses are appreciated in the testicles. Left and right epididymis are normal. Rectal: Patient with  normal sphincter tone. Anus and perineum without scarring or rashes. No rectal masses are appreciated. Prostate is approximately 60 grams, no nodules are appreciated. Seminal vesicles are normal. Skin: No rashes, bruises or suspicious lesions. Lymph: No cervical or inguinal adenopathy. Neurologic: Grossly intact, no focal deficits, moving all 4 extremities. Psychiatric: Normal mood and affect.  Laboratory  Data: Lab Results  Component Value Date   WBC 6.9 08/06/2015   HGB 11.4* 04/16/2015   HCT 41.9 08/06/2015   MCV 89 08/06/2015   PLT 246 08/06/2015    Lab Results  Component Value Date   CREATININE 0.72 04/15/2015   PSA History:  4.0 ng/mL on 06/17/2012-started on Jalyn  2.0 ng/mL on 07/22/2012  4.8 ng/mL on 12/29/2014-restarted on Jalyn  3.6 ng/mL on 01/12/2015  2.5 ng/mL on 04/08/2015  3.5 ng/mL on 07/29/2015  4.9 ng/mL on 08/23/2015  Assessment & Plan:    1. Elevated PSA:    PSA continues to rise even though he is on [place].  I have repeated the PSA today.  If is does not return to baseline, I would recommend a biopsy.    2. BPH with LUTS:   Patient's IPSS score is 8/3.    His DRE demonstrates benign enlargement.   Continue the [place].  Follow up pending labs.  Return for pending labs.  Zara Council, PA-C  [facility] [address], [place],  [address] [phone]  Addendum:   Patient's repeated PSA returned at 4.9.  I recommended a biopsy for the patient, but the patient stated that he had not been taking his Jalyn consistently.  I have now recommended that he take Jalyn for one month consistently and to recheck his PSA.

## 2015-08-24 ENCOUNTER — Telehealth: Payer: Self-pay | Admitting: Family Medicine

## 2015-08-24 ENCOUNTER — Telehealth: Payer: Self-pay

## 2015-08-24 DIAGNOSIS — R972 Elevated prostate specific antigen [PSA]: Secondary | ICD-10-CM

## 2015-08-24 LAB — PSA: PROSTATE SPECIFIC AG, SERUM: 4.9 ng/mL — AB (ref 0.0–4.0)

## 2015-08-24 MED ORDER — DUTASTERIDE-TAMSULOSIN HCL 0.5-0.4 MG PO CAPS: 1.0000 | ORAL_CAPSULE | Freq: Every day | ORAL | Status: DC

## 2015-08-24 NOTE — Telephone Encounter (Signed)
Spoke with pt in reference to PSA results. Pt stated that he has not been taking dusasteride/tamsulosin daily. Per pt medication has only been taken about every 2 days. Please advise.

## 2015-08-24 NOTE — Telephone Encounter (Signed)
-----   Message from Nori Riis, PA-C sent at 08/24/2015  8:13 AM EDT ----- PSA is climbing.  This is concerning because he is on dutasteride/tamsulosin and his PSA should be decreasing.  I would like to have the patient to undergo a prostate biopsy.

## 2015-08-24 NOTE — Telephone Encounter (Signed)
There is an outstanding vit D and BMP that never resulted; please see if lab can add those on since Zara Council just had a PSA drawn yesterday; if not, please ask pt to go within a week to get these checked; we're sorry for any inconvenience

## 2015-08-24 NOTE — Telephone Encounter (Signed)
Patient will stop by on Monday to pick up the lab order. He is asking that you please have slip ready. Thank you

## 2015-08-24 NOTE — Telephone Encounter (Signed)
Left voice mail

## 2015-08-24 NOTE — Telephone Encounter (Signed)
I would like for him to take the medication consistently, every day. Then we will recheck his PSA in 1 month.

## 2015-08-24 NOTE — Telephone Encounter (Signed)
LMOM

## 2015-08-24 NOTE — Telephone Encounter (Signed)
Spoke with pt in reference to PSA and medication. Reinforced with pt to take medication every day and have PSA redrawn in 6mo. Pt voiced understanding. Orders placed. Pt will call back to make lab appt.

## 2016-02-07 ENCOUNTER — Encounter: Payer: Self-pay | Admitting: Family Medicine

## 2016-02-07 ENCOUNTER — Telehealth: Payer: Self-pay

## 2016-02-07 ENCOUNTER — Ambulatory Visit (INDEPENDENT_AMBULATORY_CARE_PROVIDER_SITE_OTHER): Payer: BLUE CROSS/BLUE SHIELD | Admitting: Family Medicine

## 2016-02-07 DIAGNOSIS — Z0000 Encounter for general adult medical examination without abnormal findings: Secondary | ICD-10-CM

## 2016-02-07 DIAGNOSIS — Z1159 Encounter for screening for other viral diseases: Secondary | ICD-10-CM

## 2016-02-07 DIAGNOSIS — R413 Other amnesia: Secondary | ICD-10-CM | POA: Diagnosis not present

## 2016-02-07 DIAGNOSIS — Z114 Encounter for screening for human immunodeficiency virus [HIV]: Secondary | ICD-10-CM | POA: Diagnosis not present

## 2016-02-07 LAB — CBC WITH DIFFERENTIAL/PLATELET
BASOS PCT: 0 %
Basophils Absolute: 0 cells/uL (ref 0–200)
EOS ABS: 375 {cells}/uL (ref 15–500)
EOS PCT: 5 %
HCT: 45.1 % (ref 38.5–50.0)
Hemoglobin: 14.7 g/dL (ref 13.2–17.1)
Lymphocytes Relative: 26 %
Lymphs Abs: 1950 cells/uL (ref 850–3900)
MCH: 30.2 pg (ref 27.0–33.0)
MCHC: 32.6 g/dL (ref 32.0–36.0)
MCV: 92.8 fL (ref 80.0–100.0)
MONOS PCT: 10 %
MPV: 10.1 fL (ref 7.5–12.5)
Monocytes Absolute: 750 cells/uL (ref 200–950)
NEUTROS ABS: 4425 {cells}/uL (ref 1500–7800)
Neutrophils Relative %: 59 %
PLATELETS: 238 10*3/uL (ref 140–400)
RBC: 4.86 MIL/uL (ref 4.20–5.80)
RDW: 13.3 % (ref 11.0–15.0)
WBC: 7.5 10*3/uL (ref 3.8–10.8)

## 2016-02-07 LAB — COMPLETE METABOLIC PANEL WITH GFR
ALBUMIN: 3.9 g/dL (ref 3.6–5.1)
ALT: 17 U/L (ref 9–46)
AST: 24 U/L (ref 10–35)
Alkaline Phosphatase: 70 U/L (ref 40–115)
BILIRUBIN TOTAL: 0.6 mg/dL (ref 0.2–1.2)
BUN: 13 mg/dL (ref 7–25)
CALCIUM: 9.1 mg/dL (ref 8.6–10.3)
CO2: 27 mmol/L (ref 20–31)
CREATININE: 0.98 mg/dL (ref 0.70–1.25)
Chloride: 102 mmol/L (ref 98–110)
GFR, Est Non African American: 82 mL/min (ref >=60)
Glucose, Bld: 81 mg/dL (ref 65–99)
Potassium: 4.3 mmol/L (ref 3.5–5.3)
Sodium: 137 mmol/L (ref 135–146)
TOTAL PROTEIN: 6.4 g/dL (ref 6.1–8.1)

## 2016-02-07 LAB — LIPID PANEL
CHOL/HDL RATIO: 3 ratio (ref <5.0)
CHOLESTEROL: 164 mg/dL (ref <200)
HDL: 55 mg/dL (ref >40)
LDL Cholesterol: 101 mg/dL — ABNORMAL HIGH (ref <100)
Triglycerides: 41 mg/dL (ref <150)
VLDL: 8 mg/dL (ref <30)

## 2016-02-07 NOTE — Telephone Encounter (Signed)
Pt wife called regarding his appointment today. She asked if we are able to screen him for alzheimer without him knowing. Miel and I checked the pt HIPPA consent form, pt wife is on the consent form. I mention to her that is something I would have to talk to Dr. lada about. I asked pt if I could give her a call back regarding the issue; pt agreed. Pt could not give me her cell phone number because she couldn't remember. She asked me to call her after 3:30 on her house phone. I will be out of the office at that time. Spoke with Dr. Sanda Klein; Dr. lada suggest they schedule an appointment where they both can be here together.

## 2016-02-07 NOTE — Assessment & Plan Note (Signed)
USPSTF grade A and B recommendations reviewed with patient; age-appropriate recommendations, preventive care, screening tests, etc discussed and encouraged; healthy living encouraged; see AVS for patient education given to patient  

## 2016-02-07 NOTE — Patient Instructions (Addendum)
We'll get labs today We'll have you see a neurologist about possible memory testing Health Maintenance, Male A healthy lifestyle and preventative care can promote health and wellness.  Maintain regular health, dental, and eye exams.  Eat a healthy diet. Foods like vegetables, fruits, whole grains, low-fat dairy products, and lean protein foods contain the nutrients you need and are low in calories. Decrease your intake of foods high in solid fats, added sugars, and salt. Get information about a proper diet from your health care provider, if necessary.  Regular physical exercise is one of the most important things you can do for your health. Most adults should get at least 150 minutes of moderate-intensity exercise (any activity that increases your heart rate and causes you to sweat) each week. In addition, most adults need muscle-strengthening exercises on 2 or more days a week.   Maintain a healthy weight. The body mass index (BMI) is a screening tool to identify possible weight problems. It provides an estimate of body fat based on height and weight. Your health care provider can find your BMI and can help you achieve or maintain a healthy weight. For males 20 years and older:  A BMI below 18.5 is considered underweight.  A BMI of 18.5 to 24.9 is normal.  A BMI of 25 to 29.9 is considered overweight.  A BMI of 30 and above is considered obese.  Maintain normal blood lipids and cholesterol by exercising and minimizing your intake of saturated fat. Eat a balanced diet with plenty of fruits and vegetables. Blood tests for lipids and cholesterol should begin at age 27 and be repeated every 5 years. If your lipid or cholesterol levels are high, you are over age 39, or you are at high risk for heart disease, you may need your cholesterol levels checked more frequently.Ongoing high lipid and cholesterol levels should be treated with medicines if diet and exercise are not working.  If you smoke,  find out from your health care provider how to quit. If you do not use tobacco, do not start.  Lung cancer screening is recommended for adults aged 74-80 years who are at high risk for developing lung cancer because of a history of smoking. A yearly low-dose CT scan of the lungs is recommended for people who have at least a 30-pack-year history of smoking and are current smokers or have quit within the past 15 years. A pack year of smoking is smoking an average of 1 pack of cigarettes a day for 1 year (for example, a 30-pack-year history of smoking could mean smoking 1 pack a day for 30 years or 2 packs a day for 15 years). Yearly screening should continue until the smoker has stopped smoking for at least 15 years. Yearly screening should be stopped for people who develop a health problem that would prevent them from having lung cancer treatment.  If you choose to drink alcohol, do not have more than 2 drinks per day. One drink is considered to be 12 oz (360 mL) of beer, 5 oz (150 mL) of wine, or 1.5 oz (45 mL) of liquor.  Avoid the use of street drugs. Do not share needles with anyone. Ask for help if you need support or instructions about stopping the use of drugs.  High blood pressure causes heart disease and increases the risk of stroke. High blood pressure is more likely to develop in:  People who have blood pressure in the end of the normal range (100-139/85-89 mm Hg).  People who are overweight or obese.  People who are African American.  If you are 69-22 years of age, have your blood pressure checked every 3-5 years. If you are 25 years of age or older, have your blood pressure checked every year. You should have your blood pressure measured twice-once when you are at a hospital or clinic, and once when you are not at a hospital or clinic. Record the average of the two measurements. To check your blood pressure when you are not at a hospital or clinic, you can use:  An automated blood  pressure machine at a pharmacy.  A home blood pressure monitor.  If you are 15-92 years old, ask your health care provider if you should take aspirin to prevent heart disease.  Diabetes screening involves taking a blood sample to check your fasting blood sugar level. This should be done once every 3 years after age 23 if you are at a normal weight and without risk factors for diabetes. Testing should be considered at a younger age or be carried out more frequently if you are overweight and have at least 1 risk factor for diabetes.  Colorectal cancer can be detected and often prevented. Most routine colorectal cancer screening begins at the age of 22 and continues through age 40. However, your health care provider may recommend screening at an earlier age if you have risk factors for colon cancer. On a yearly basis, your health care provider may provide home test kits to check for hidden blood in the stool. A small camera at the end of a tube may be used to directly examine the colon (sigmoidoscopy or colonoscopy) to detect the earliest forms of colorectal cancer. Talk to your health care provider about this at age 67 when routine screening begins. A direct exam of the colon should be repeated every 5-10 years through age 68, unless early forms of precancerous polyps or small growths are found.  People who are at an increased risk for hepatitis B should be screened for this virus. You are considered at high risk for hepatitis B if:  You were born in a country where hepatitis B occurs often. Talk with your health care provider about which countries are considered high risk.  Your parents were born in a high-risk country and you have not received a shot to protect against hepatitis B (hepatitis B vaccine).  You have HIV or AIDS.  You use needles to inject street drugs.  You live with, or have sex with, someone who has hepatitis B.  You are a man who has sex with other men (MSM).  You get  hemodialysis treatment.  You take certain medicines for conditions like cancer, organ transplantation, and autoimmune conditions.  Hepatitis C blood testing is recommended for all people born from [birth year] through 1967 and any individual with known risk factors for hepatitis C.  Healthy men should no longer receive prostate-specific antigen (PSA) blood tests as part of routine cancer screening. Talk to your health care provider about prostate cancer screening.  Testicular cancer screening is not recommended for adolescents or adult males who have no symptoms. Screening includes self-exam, a health care provider exam, and other screening tests. Consult with your health care provider about any symptoms you have or any concerns you have about testicular cancer.  Practice safe sex. Use condoms and avoid high-risk sexual practices to reduce the spread of sexually transmitted infections (STIs).  You should be screened for STIs, including gonorrhea and chlamydia if:  You are sexually active and are younger than 24 years.  You are older than 24 years, and your health care provider tells you that you are at risk for this type of infection.  Your sexual activity has changed since you were last screened, and you are at an increased risk for chlamydia or gonorrhea. Ask your health care provider if you are at risk.  If you are at risk of being infected with HIV, it is recommended that you take a prescription medicine daily to prevent HIV infection. This is called pre-exposure prophylaxis (PrEP). You are considered at risk if:  You are a man who has sex with other men (MSM).  You are a heterosexual man who is sexually active with multiple partners.  You take drugs by injection.  You are sexually active with a partner who has HIV.  Talk with your health care provider about whether you are at high risk of being infected with HIV. If you choose to begin PrEP, you should first be tested for HIV. You should  then be tested every 3 months for as long as you are taking PrEP.  Use sunscreen. Apply sunscreen liberally and repeatedly throughout the day. You should seek shade when your shadow is shorter than you. Protect yourself by wearing long sleeves, pants, a wide-brimmed hat, and sunglasses year round whenever you are outdoors.  Tell your health care provider of new moles or changes in moles, especially if there is a change in shape or color. Also, tell your health care provider if a mole is larger than the size of a pencil eraser.  A one-time screening for abdominal aortic aneurysm (AAA) and surgical repair of large AAAs by ultrasound is recommended for men aged 80-75 years who are current or former smokers.  Stay current with your vaccines (immunizations). This information is not intended to replace advice given to you by your health care provider. Make sure you discuss any questions you have with your health care provider. Document Released: 08/26/2007 Document Revised: 03/20/2014 Document Reviewed: 12/01/2014 Elsevier Interactive Patient Education  2017 [employer].

## 2016-02-07 NOTE — Assessment & Plan Note (Signed)
Discussed one-time hep C screening recommendation for individuals born between 1945-1965 per USPSTF guidelines; patient agrees with testing; Hep C Ab ordered 

## 2016-02-07 NOTE — Assessment & Plan Note (Signed)
Discussed one-time HIV screening recommendation per USPSTF guidelines; patient agrees with testing; HIV antibody ordered 

## 2016-02-07 NOTE — Assessment & Plan Note (Signed)
Refer to neurologist for strong family hx to see if any issues and start medicine early

## 2016-02-07 NOTE — Progress Notes (Signed)
Patient ID: Eric Rose, male   DOB: [birth year], 60 y.o.   MRN: [id number]   Subjective:   Eric Rose is a 60 y.o. male here for a complete physical exam  Interim issues since last visit: no medical excitement  USPSTF grade A and B recommendations Alcohol: no Depression: Depression screen [facility] 2/9 02/07/2016 08/06/2015 12/15/2014  Decreased Interest 0 0 0  Down, Depressed, Hopeless 0 1 0  PHQ - 2 Score 0 1 0    Hypertension: controlled Obesity: no Tobacco use: no  HIV, hep B, hep C: check today STD testing and prevention (chl/gon/syphilis): no worries Lipids: check Glucose: check; ate breakfast chicken and fruit and coffee, > 6 hours Colorectal cancer: done in 2014 Breast cancer: no lumps Lung cancer: never smoker Osteoporosis: low risk AAA: no hx Aspirin: 81 mg daily Diet: pretty good Exercise: regular Skin cancer: nothing worrisome  He has noticed a few memory issues; grandmother had bad alzheimers and died in her late 37s  Past Medical History:  Diagnosis Date  . Allergy   . Anxiety   . BPH (benign prostatic hypertrophy) with urinary obstruction    followed by urologist  . Decreased libido   . Erectile dysfunction 08/08/2015  . GERD (gastroesophageal reflux disease)   . History of pneumonia   . Hypertension   . Lumbago    Past Surgical History:  Procedure Laterality Date  . JOINT REPLACEMENT Right    hip  . 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 Diabetes Mother   . Heart disease Mother   . Alzheimer's disease Mother   grandmother: Alzheimer's; he thinks his mother had a good memory, but the chart says Alzheimers; uncle may have had a stroke  Social History  Substance Use Topics  . Smoking status: Never Smoker  . Smokeless tobacco: Never Used  . Alcohol use No    Review of Systems  Objective:   Vitals:   02/07/16 1425  BP: 124/76  Pulse: 94  Resp: 14  Temp: 98.9 F (37.2 C)  TempSrc: Oral  SpO2: 97%  Weight: 192 lb (87.1 kg)  Height: 5\' 9"  (1.753 m)   Body mass index is 28.35 kg/m. Wt Readings from Last 3 Encounters:  02/07/16 192 lb (87.1 kg)  08/23/15 189 lb (85.7 kg)  08/06/15 187 lb (84.8 kg)   Physical Exam  Constitutional: He appears well-developed and well-nourished. No distress.  HENT:  Head: Normocephalic and atraumatic.  Nose: Nose normal.  Mouth/Throat: Oropharynx is clear and moist.  Eyes: EOM are normal. No scleral icterus.  Neck: No JVD present. No thyromegaly present.  Cardiovascular: Normal rate, regular rhythm and normal heart sounds.   Pulmonary/Chest: Effort normal and breath sounds normal. No respiratory distress. He has no wheezes. He has no rales.  Abdominal: Soft. Bowel sounds are normal. He exhibits no distension. There is no tenderness. There is no guarding.  Musculoskeletal: Normal range of motion. He exhibits no edema.  Lymphadenopathy:    He has no cervical adenopathy.  Neurological: He is alert. He displays no tremor and normal reflexes. He exhibits normal muscle tone. Coordination normal.  Patient said twice that his mother had memory like an elephant; oriented to year, month, not date; spelled world forwards and backwards; was able to register apple, penny, chair but could only recall  with one word hint (fruit, e.g.), but did recall all three  Skin: Skin is warm and dry. No rash noted. He is not diaphoretic. No erythema. No pallor.  Psychiatric: He has a normal mood and affect. His behavior is normal. Judgment and thought content normal.   Assessment/Plan:   Problem List Items Addressed This Visit      Other   Preventative health care    USPSTF grade A and B recommendations reviewed with patient; age-appropriate recommendations, preventive care, screening tests, etc discussed and encouraged;  healthy living encouraged; see AVS for patient education given to patient      Relevant Orders   CBC with Differential/Platelet   Lipid panel   COMPLETE METABOLIC PANEL WITH GFR   Memory impairment    Refer to neurologist for strong family hx to see if any issues and start medicine early      Relevant Orders   Ambulatory referral to Neurology   TSH   Vitamin B12   Encounter for screening for HIV    Discussed one-time HIV screening recommendation per USPSTF guidelines; patient agrees with testing; HIV antibody ordered      Relevant Orders   HIV antibody   Encounter for hepatitis C screening test for low risk patient    Discussed one-time hep C screening recommendation for individuals born between 1945-1965 per USPSTF guidelines; patient agrees with testing; Hep C Ab ordered      Relevant Orders   Hepatitis C Antibody      No orders of the defined types were placed in this encounter.  Orders Placed This Encounter  Procedures  . CBC with Differential/Platelet  . Lipid panel  . TSH  . Vitamin B12  . COMPLETE METABOLIC PANEL WITH GFR  . HIV antibody  . Hepatitis C Antibody  . Ambulatory referral to Neurology    Referral Priority:   Routine    Referral Type:   Consultation    Referral Reason:   Specialty Services Required    Requested Specialty:   Neurology    Number of Visits Requested:   1   Follow up plan: Return in about 1 year (around 02/06/2017) for complete physical.  An After Visit Summary was printed and given to the patient.

## 2016-02-08 LAB — HIV ANTIBODY (ROUTINE TESTING W REFLEX): HIV: NONREACTIVE

## 2016-02-08 LAB — HEPATITIS C ANTIBODY: HCV AB: NEGATIVE

## 2016-02-08 LAB — TSH: TSH: 2.3 mIU/L (ref 0.40–4.50)

## 2016-02-08 LAB — VITAMIN B12: Vitamin B-12: 681 pg/mL (ref 200–1100)

## 2016-02-18 ENCOUNTER — Ambulatory Visit (INDEPENDENT_AMBULATORY_CARE_PROVIDER_SITE_OTHER): Payer: BLUE CROSS/BLUE SHIELD

## 2016-02-18 DIAGNOSIS — Z23 Encounter for immunization: Secondary | ICD-10-CM | POA: Diagnosis not present

## 2016-03-08 ENCOUNTER — Other Ambulatory Visit: Payer: Self-pay | Admitting: Family Medicine

## 2016-03-15 ENCOUNTER — Other Ambulatory Visit: Payer: Self-pay

## 2016-03-15 MED ORDER — LISINOPRIL 20 MG PO TABS: 20.0000 mg | ORAL_TABLET | Freq: Every day | ORAL | 1 refills | Status: DC

## 2016-03-15 NOTE — Telephone Encounter (Signed)
90 day

## 2016-03-15 NOTE — Telephone Encounter (Signed)
Last Cr and K+ reviewed; rx approved 

## 2016-03-15 NOTE — Telephone Encounter (Signed)
Glitch in our system; I was not receiving electronic refill requests from Dec 13 until yesterday; addressed with IT; addressing now ------------------------------------ Blank note 

## 2016-06-28 ENCOUNTER — Other Ambulatory Visit: Payer: Self-pay | Admitting: Family Medicine

## 2016-06-29 NOTE — Telephone Encounter (Signed)
Patient should not be out of the lisinopril yet Please resolve with pharmacy; make sure he's taking it correctly; thank you

## 2016-06-30 ENCOUNTER — Other Ambulatory Visit: Payer: Self-pay | Admitting: Family Medicine

## 2016-06-30 NOTE — Telephone Encounter (Signed)
Last Cr and K+ normal; rx approved

## 2016-07-03 NOTE — Telephone Encounter (Signed)
Spoke with pt and he confirmed he is taking 20 mg lisinopril once a day. Pt also have enough to last him.

## 2016-08-10 ENCOUNTER — Other Ambulatory Visit: Payer: Self-pay | Admitting: Family Medicine

## 2016-09-11 ENCOUNTER — Other Ambulatory Visit: Payer: Self-pay | Admitting: Urology

## 2016-09-11 DIAGNOSIS — R972 Elevated prostate specific antigen [PSA]: Secondary | ICD-10-CM

## 2016-09-12 ENCOUNTER — Telehealth: Payer: Self-pay | Admitting: Family Medicine

## 2016-09-12 NOTE — Telephone Encounter (Signed)
Pt has questions about his Alzheimers.

## 2016-09-12 NOTE — Telephone Encounter (Signed)
Called home wife states was sleeping, was told he could call back before 5 or try again on Thursday, due to office being closed tomorrow

## 2016-09-18 ENCOUNTER — Other Ambulatory Visit: Payer: Self-pay | Admitting: Urology

## 2016-09-18 DIAGNOSIS — R972 Elevated prostate specific antigen [PSA]: Secondary | ICD-10-CM

## 2016-09-19 ENCOUNTER — Other Ambulatory Visit: Payer: Self-pay | Admitting: Urology

## 2016-09-19 DIAGNOSIS — R972 Elevated prostate specific antigen [PSA]: Secondary | ICD-10-CM

## 2016-09-27 ENCOUNTER — Encounter: Payer: Self-pay | Admitting: Urology

## 2016-09-27 ENCOUNTER — Ambulatory Visit: Payer: BLUE CROSS/BLUE SHIELD | Admitting: Urology

## 2016-10-02 ENCOUNTER — Other Ambulatory Visit: Payer: Self-pay | Admitting: Urology

## 2016-10-02 DIAGNOSIS — R972 Elevated prostate specific antigen [PSA]: Secondary | ICD-10-CM

## 2016-10-02 NOTE — Progress Notes (Signed)
This encounter was created in error - please disregard.

## 2016-10-03 ENCOUNTER — Encounter: Payer: BLUE CROSS/BLUE SHIELD | Admitting: Urology

## 2016-10-03 ENCOUNTER — Ambulatory Visit (INDEPENDENT_AMBULATORY_CARE_PROVIDER_SITE_OTHER): Payer: BLUE CROSS/BLUE SHIELD | Admitting: Urology

## 2016-10-03 ENCOUNTER — Encounter: Payer: Self-pay | Admitting: Urology

## 2016-10-03 VITALS — BP 147/90 | HR 63 | Ht 71.0 in | Wt 197.4 lb

## 2016-10-03 DIAGNOSIS — R972 Elevated prostate specific antigen [PSA]: Secondary | ICD-10-CM

## 2016-10-03 DIAGNOSIS — N401 Enlarged prostate with lower urinary tract symptoms: Secondary | ICD-10-CM | POA: Diagnosis not present

## 2016-10-03 DIAGNOSIS — N138 Other obstructive and reflux uropathy: Secondary | ICD-10-CM | POA: Diagnosis not present

## 2016-10-03 MED ORDER — DUTASTERIDE-TAMSULOSIN HCL 0.5-0.4 MG PO CAPS: ORAL_CAPSULE | ORAL | 3 refills | Status: DC

## 2016-10-03 NOTE — Progress Notes (Signed)
2:20 PM   Eric Rose [birth year] [id number]  Referring provider: Arnetha Courser, MD [address] [place], [place] [address]  Chief Complaint  Patient presents with  . Benign Prostatic Hypertrophy    1 year follow up  Need med refill  . Elevated PSA    HPI: Patient is a 61 year old African American male who presents today for 1 year follow-up after being restarted on his dutasteride/tamsulosin for an elevation in the PSA.    PSA Elevation Patient was referred to [place] by his primary care physician, Dr. Lucita Lora, for an elevated PSA of 4.8 on 12/29/2014.  Patient had not been seen in our office for the last 2 years.  At that time, he was having difficulty with erections and BPH with LUTS.  His PSA at that time was 4.0.  He was started on Jalyn and a follow-up PSA was 2.0.   His most recent PSA was 4.9 ng/mL on 08/23/2015.  There was confusion as to whether or not he was taking the dutasteride consistently.  He was to take the dutasteride daily and have the PSA repeated but he did not follow through with the blood draw.    BPH WITH LUTS His IPSS score today is 5, which is mild lower urinary tract symptomatology.  His mixed with his quality life due to his urinary symptoms.   His previous was I PSS 8/3.  His major complaint today rare frequency, urgency and nocturia.  He has had these symptoms for over two years.  He denies any dysuria, hematuria or suprapubic pain.  He currently taking Jalyn, but he has been out of the medication for one month.   He also denies any recent fevers, chills, nausea or vomiting.   He does not have a family history of PCa.     IPSS    Row Name 10/03/16 1400         International Prostate Symptom Score   How often have you had the sensation of not emptying your bladder? Less than half the time     How often have you had to urinate less than every two hours? Not at All     How often have you found you stopped and started again several times  when you urinated? Not at All     How often have you found it difficult to postpone urination? Less than half the time     How often have you had a weak urinary stream? Not at All     How often have you had to strain to start urination? Not at All     How many times did you typically get up at night to urinate? 1 Time     Total IPSS Score 5       Quality of Life due to urinary symptoms   If you were to spend the rest of your life with your urinary condition just the way it is now how would you feel about that? Mixed        Score:  1-7 Mild 8-19 Moderate 20-35 Severe   PMH: Past Medical History:  Diagnosis Date  . Allergy   . Anxiety   . BPH (benign prostatic hypertrophy) with urinary obstruction    followed by urologist  . Decreased libido   . Erectile dysfunction 08/08/2015  . GERD (gastroesophageal reflux disease)   . History of pneumonia   . Hypertension   . Lumbago     Surgical History:  Past Surgical History:  Procedure Laterality Date  . JOINT REPLACEMENT Right    hip  . 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   Procedure: TOTAL HIP ARTHROPLASTY;  Surgeon: Earnestine Leys, MD;  Location: [facility] ORS;  Service: Orthopedics;  Laterality: Left;    Home Medications:  Allergies as of 10/03/2016   No Known Allergies     Medication List       Accurate as of 10/03/16  2:20 PM. Always use your most recent med list.          amoxicillin 500 MG capsule Commonly known as:  AMOXIL amoxicillin 500 mg capsule   aspirin 81 MG tablet aspirin   CIALIS 5 MG tablet Generic drug:  tadalafil TAKE 1 TABLET BY MOUTH EVERY DAY FOR ERECTILE DYSFUNCTION.   ciprofloxacin 500 MG tablet Commonly known as:  CIPRO ciprofloxacin 500 mg tablet   Dutasteride-Tamsulosin HCl 0.5-0.4 MG Caps Commonly known as:  JALYN Jalyn 0.5 mg-0.4 mg capsule, extended release   Fish Oil Oil by Does not apply route.   gabapentin 400 MG capsule Commonly known as:  NEURONTIN gabapentin 400 mg  capsule   GLUCOSAMINE CHONDR COMPLEX PO Take by mouth.   HYDROcodone-acetaminophen 5-325 MG tablet Commonly known as:  NORCO/VICODIN hydrocodone 5 mg-acetaminophen 325 mg tablet   indomethacin 50 MG capsule Commonly known as:  INDOCIN indomethacin 50 mg capsule   lisinopril 20 MG tablet Commonly known as:  PRINIVIL,ZESTRIL TAKE ONE TABLET BY MOUTH ONCE DAILY   meloxicam 15 MG tablet Commonly known as:  MOBIC meloxicam 15 mg tablet  TAKE ONE TABLET BY MOUTH ONCE DAILY   multivitamin capsule Take 1 capsule by mouth daily.   traMADol 50 MG tablet Commonly known as:  ULTRAM tramadol 50 mg tablet  TAKE ONE TABLET BY MOUTH EVERY 6 HOURS AS NEEDED FOR PAIN   VIAGRA 25 MG tablet Generic drug:  sildenafil Viagra 25 mg tablet   VITAMIN D (ERGOCALCIFEROL) PO Take by mouth.   VOLTAREN 1 % Gel Generic drug:  diclofenac sodium Voltaren 1 % topical gel       Allergies: No Known Allergies  Family History: Family History  Problem Relation Age of Onset  . Diabetes Mother   . Heart disease Mother   . Alzheimer's disease Mother   . Prostate cancer Brother   . Prostate cancer Paternal Uncle   . Bladder Cancer Neg Hx   . Kidney cancer Neg Hx     Social History:  reports that he has never smoked. He has never used smokeless tobacco. He reports that he does not drink alcohol or use drugs.  ROS: UROLOGY Frequent Urination?: Yes Hard to postpone urination?: Yes Burning/pain with urination?: No Get up at night to urinate?: Yes Leakage of urine?: No Urine stream starts and stops?: No Trouble starting stream?: No Do you have to strain to urinate?: No Blood in urine?: No Urinary tract infection?: No Sexually transmitted disease?: No Injury to kidneys or bladder?: No Painful intercourse?: No Weak stream?: No Erection problems?: No Penile pain?: No  Gastrointestinal Nausea?: No Vomiting?: No Indigestion/heartburn?: No Diarrhea?: No Constipation?:  No  Constitutional Fever: No Night sweats?: Yes Weight loss?: No Fatigue?: No  Skin Skin rash/lesions?: No Itching?: No  Eyes Blurred vision?: No Double vision?: No  Ears/Nose/Throat Sore throat?: No Sinus problems?: Yes  Hematologic/Lymphatic Swollen glands?: No Easy bruising?: No  Cardiovascular Leg swelling?: No Chest pain?: No  Respiratory Cough?: No Shortness of breath?: No  Endocrine Excessive thirst?: No  Musculoskeletal  Back pain?: No Joint pain?: Yes  Neurological Headaches?: No Dizziness?: No  Psychologic Depression?: No Anxiety?: No  Physical Exam: BP (!) 147/90   Pulse 63   Ht 5\' 11"  (1.803 m)   Wt 197 lb 6.4 oz (89.5 kg)   BMI 27.53 kg/m    Constitutional: Well nourished. Alert and oriented, No acute distress. HEENT: [place] AT, moist mucus membranes. Trachea midline, no masses. Cardiovascular: No clubbing, cyanosis, or edema. Respiratory: Normal respiratory effort, no increased work of breathing. GI: Abdomen is soft, non tender, non distended, no abdominal masses. Liver and spleen not palpable.  No hernias appreciated.  Stool sample for occult testing is not indicated.   GU: No CVA tenderness.  No bladder fullness or masses.  Patient with uncircumcised phallus. Foreskin easily retracted  Urethral meatus is patent.  No penile discharge. No penile lesions or rashes. Scrotum without lesions, cysts, rashes and/or edema.  Testicles are located scrotally bilaterally. No masses are appreciated in the testicles. Left and right epididymis are normal. Rectal: Patient with  normal sphincter tone. Anus and perineum without scarring or rashes. No rectal masses are appreciated. Prostate is approximately 60 grams, no nodules are appreciated. Seminal vesicles are normal. Skin: No rashes, bruises or suspicious lesions. Lymph: No cervical or inguinal adenopathy. Neurologic: Grossly intact, no focal deficits, moving all 4 extremities. Psychiatric: Normal mood  and affect.  Laboratory Data: Lab Results  Component Value Date   WBC 7.5 02/07/2016   HGB 14.7 02/07/2016   HCT 45.1 02/07/2016   MCV 92.8 02/07/2016   PLT 238 02/07/2016    Lab Results  Component Value Date   CREATININE 0.98 02/07/2016   PSA History:  4.0 ng/mL on 06/17/2012-started on Jalyn  2.0 ng/mL on 07/22/2012  4.8 ng/mL on 12/29/2014-restarted on Jalyn  3.6 ng/mL on 01/12/2015  2.5 ng/mL on 04/08/2015  3.5 ng/mL on 07/29/2015  4.9 ng/mL on 08/23/2015  I have reviewed the labs  Assessment & Plan:    1. Elevated PSA  - patient has been without Jalyn for one month - he is not consistent with taking his Jalyn  - PSA drawn today - if this returns elevated will add a free and total PSA    2. BPH with LUTS  - IPSS score is 5/3, it is improving  - Continue conservative management, avoiding bladder irritants and timed voiding's  - most bothersome symptoms is/are frequency, urgency nocturia  - Continue dutasteride 0.5 mg daily:refills given  - RTC in 6 months for IPSS, PSA and exam    Return in about 6 months (around 04/05/2017) for IPSS, PSA and exam.  Zara Council, [facility]  [place] [address], [place], [place] [address] [phone]

## 2016-10-04 ENCOUNTER — Telehealth: Payer: Self-pay

## 2016-10-04 ENCOUNTER — Other Ambulatory Visit: Payer: Self-pay

## 2016-10-04 LAB — PSA: Prostate Specific Ag, Serum: 2.6 ng/mL (ref 0.0–4.0)

## 2016-10-04 MED ORDER — DUTASTERIDE-TAMSULOSIN HCL 0.5-0.4 MG PO CAPS: 1.0000 | ORAL_CAPSULE | Freq: Every day | ORAL | 3 refills | Status: AC

## 2016-10-04 NOTE — Telephone Encounter (Signed)
-----   Message from Nori Riis, PA-C sent at 10/04/2016  7:51 AM EDT ----- Please let Mr. Torok know that his PSA is normal at 2.6 and we will see him in six months.

## 2016-10-04 NOTE — Telephone Encounter (Signed)
Called pt. No answer.  Left vmail per DPR.  

## 2016-12-29 ENCOUNTER — Ambulatory Visit (INDEPENDENT_AMBULATORY_CARE_PROVIDER_SITE_OTHER): Payer: BLUE CROSS/BLUE SHIELD

## 2016-12-29 DIAGNOSIS — Z23 Encounter for immunization: Secondary | ICD-10-CM | POA: Diagnosis not present

## 2017-01-14 IMAGING — CR DG FEMUR 2+V PORT*L*
1 series · 3 of 3 positions shown · non-contrast
Comparison: Pelvis 12/17/2013

CLINICAL DATA: 59-year-old male status post left hip arthroplasty.
Initial encounter.

EXAM:
LEFT FEMUR PORTABLE 2 VIEWS

[Series 1: ap · 0.17mm/px · 3 of 3 slices shown]
[im 1/3]
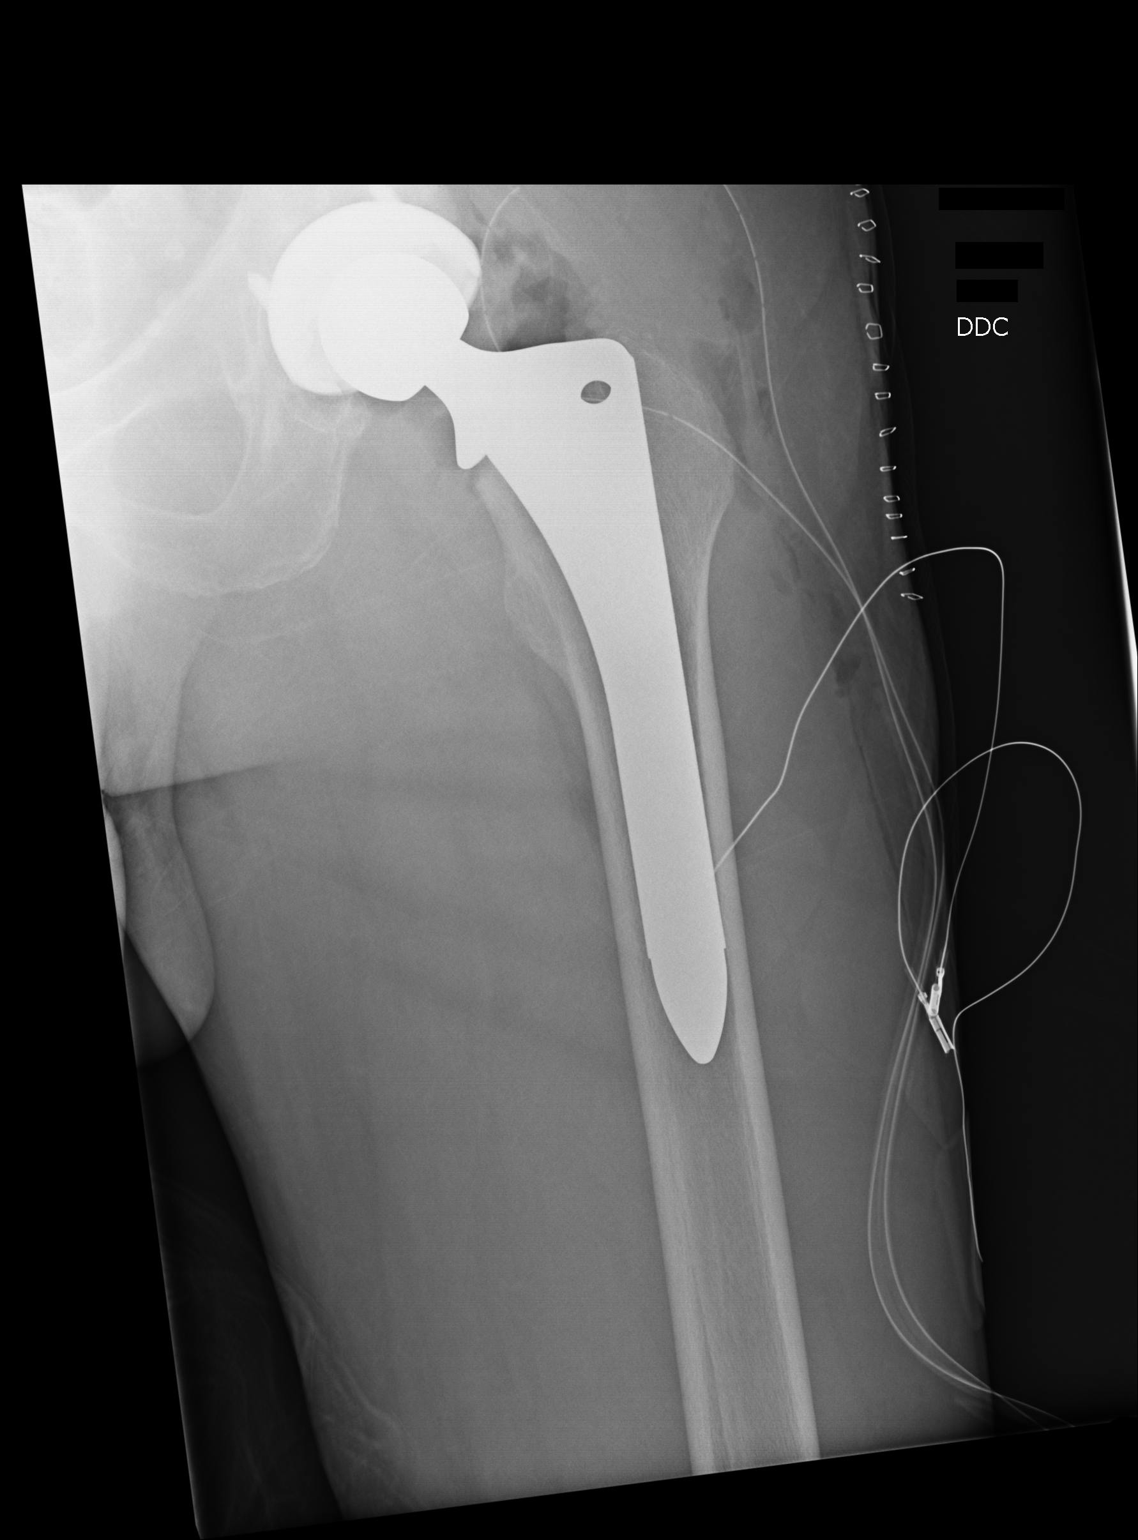
[im 2/3]
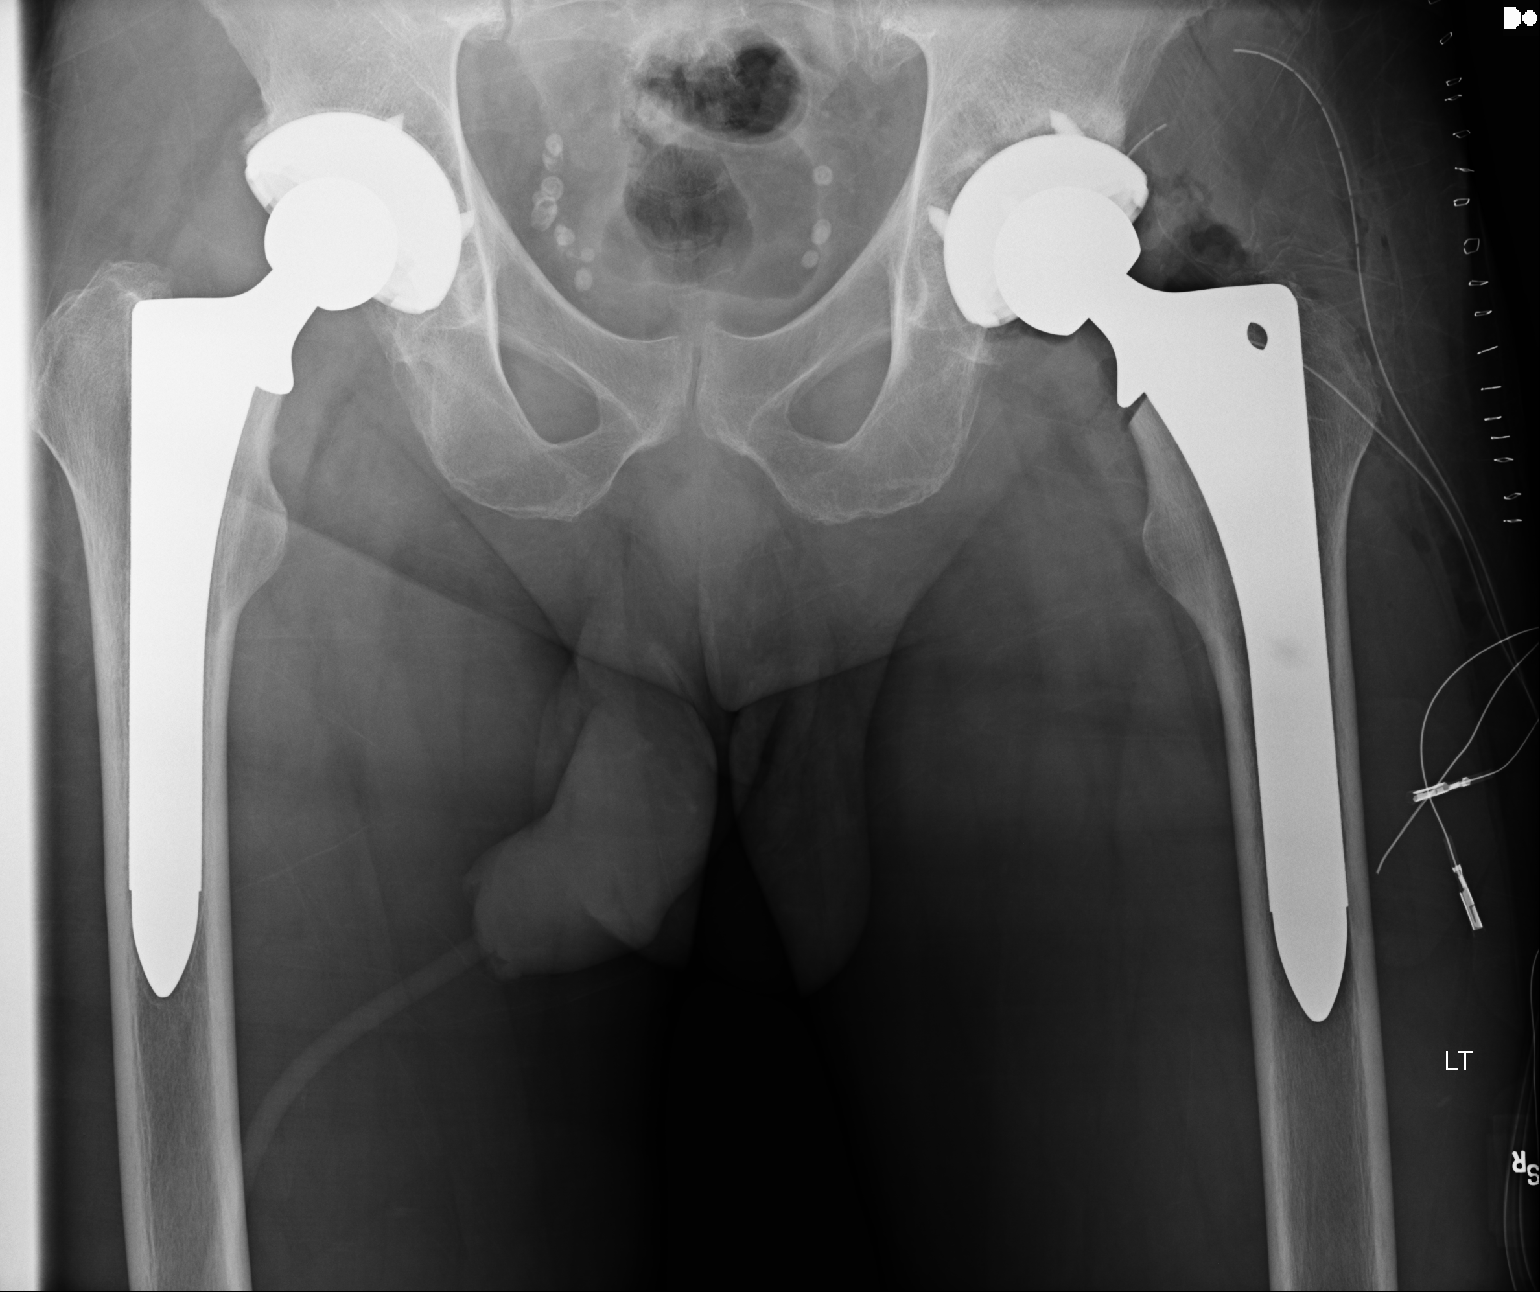
[im 3/3]
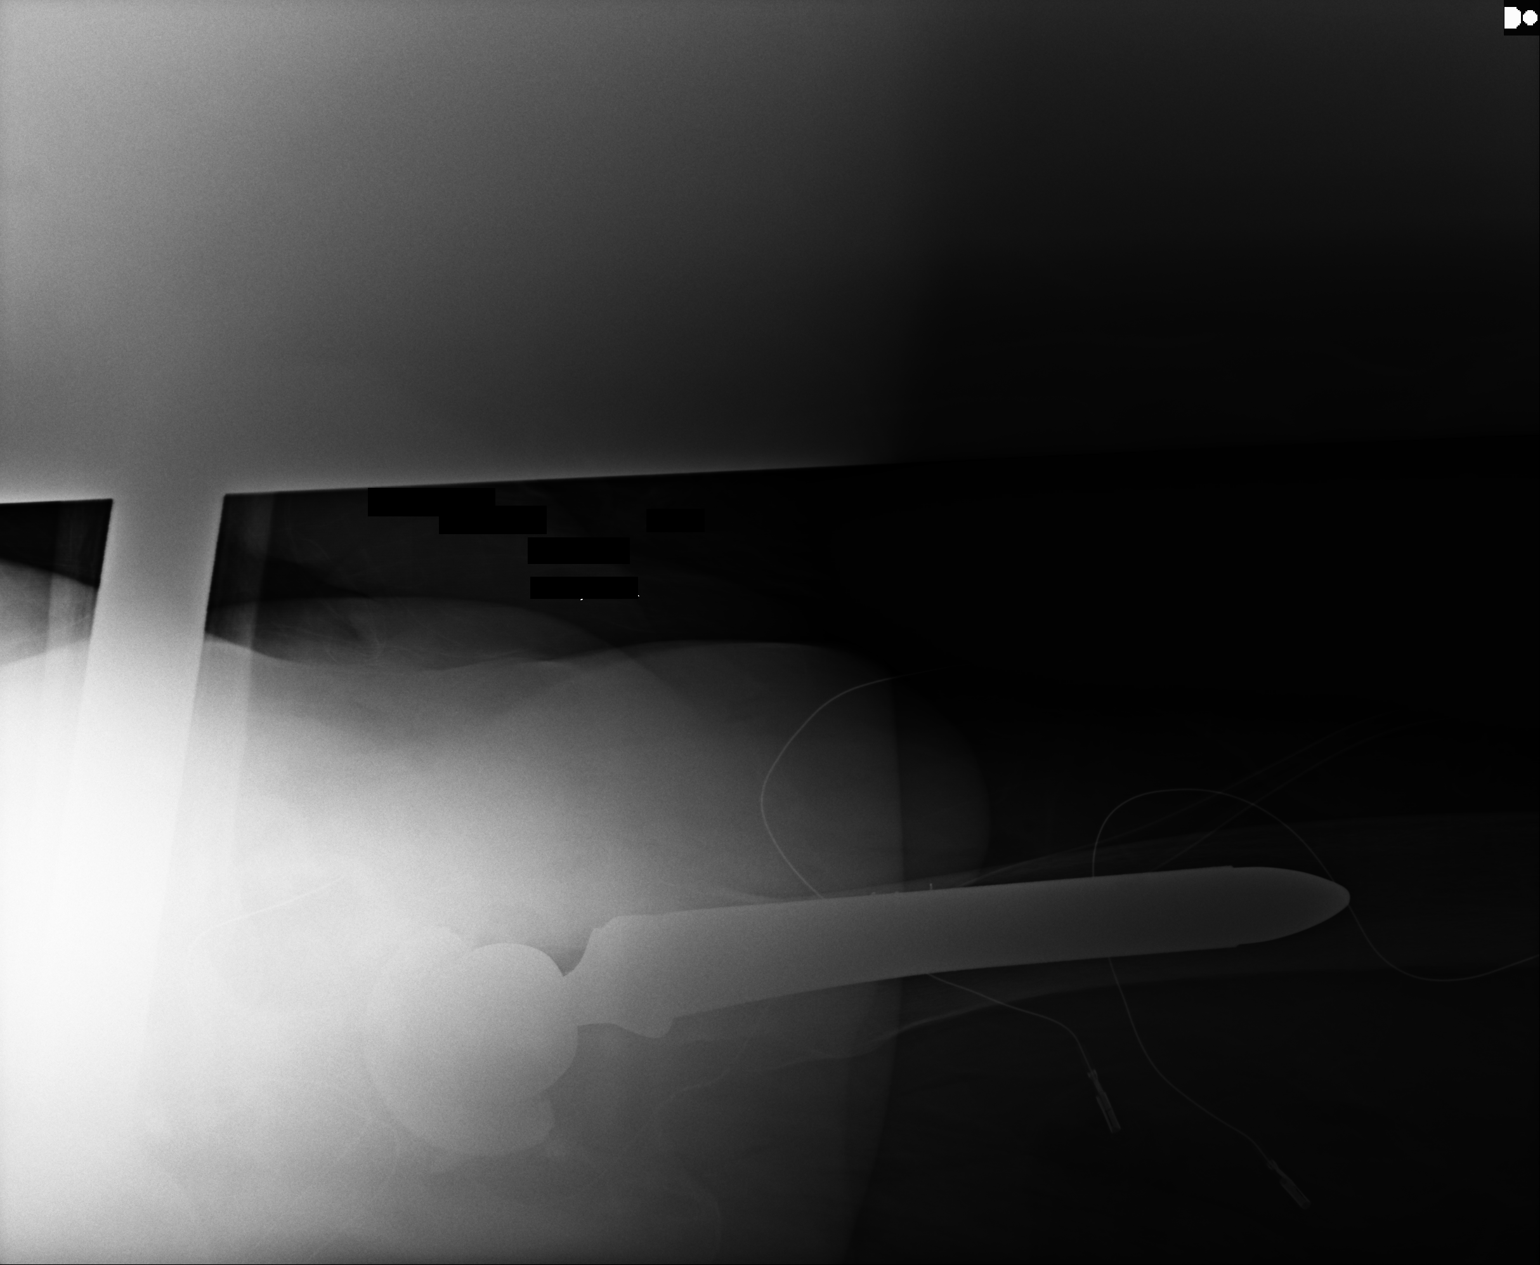

[3 of 3 positions shown; findings below may reference images not displayed]

FINDINGS: Portable AP and cross-table lateral views of the left hip. Interval
left total hip arthroplasty. Hardware components intact and normally
aligned. Postoperative changes to the surrounding soft tissues, 2
postoperative drains in place. No unexpected osseous changes.
Numerous pelvic phleboliths incidentally re- demonstrated.
IMPRESSION: Left hip arthroplasty with no adverse features.

## 2017-02-09 ENCOUNTER — Encounter: Payer: BLUE CROSS/BLUE SHIELD | Admitting: Family Medicine

## 2017-02-12 ENCOUNTER — Telehealth: Payer: Self-pay | Admitting: Family Medicine

## 2017-02-12 NOTE — Telephone Encounter (Addendum)
Pt requested list of medications for his dentist.

## 2017-02-21 ENCOUNTER — Ambulatory Visit: Payer: BLUE CROSS/BLUE SHIELD

## 2017-02-21 ENCOUNTER — Encounter: Payer: Self-pay | Admitting: Family Medicine

## 2017-02-21 ENCOUNTER — Ambulatory Visit (INDEPENDENT_AMBULATORY_CARE_PROVIDER_SITE_OTHER): Payer: BLUE CROSS/BLUE SHIELD | Admitting: Family Medicine

## 2017-02-21 VITALS — BP 126/74 | HR 92 | Temp 97.6°F | Resp 12 | Ht 69.38 in | Wt 194.8 lb

## 2017-02-21 DIAGNOSIS — N138 Other obstructive and reflux uropathy: Secondary | ICD-10-CM | POA: Diagnosis not present

## 2017-02-21 DIAGNOSIS — N401 Enlarged prostate with lower urinary tract symptoms: Secondary | ICD-10-CM | POA: Diagnosis not present

## 2017-02-21 DIAGNOSIS — R972 Elevated prostate specific antigen [PSA]: Secondary | ICD-10-CM

## 2017-02-21 DIAGNOSIS — Z96643 Presence of artificial hip joint, bilateral: Secondary | ICD-10-CM | POA: Diagnosis not present

## 2017-02-21 DIAGNOSIS — R221 Localized swelling, mass and lump, neck: Secondary | ICD-10-CM

## 2017-02-21 DIAGNOSIS — Z0000 Encounter for general adult medical examination without abnormal findings: Secondary | ICD-10-CM

## 2017-02-21 DIAGNOSIS — H5462 Unqualified visual loss, left eye, normal vision right eye: Secondary | ICD-10-CM

## 2017-02-21 DIAGNOSIS — I10 Essential (primary) hypertension: Secondary | ICD-10-CM

## 2017-02-21 HISTORY — DX: Unqualified visual loss, left eye, normal vision right eye: H54.62

## 2017-02-21 NOTE — Progress Notes (Signed)
Patient ID: Eric Rose, male   DOB: 05-May-1955, 61 y.o.   MRN: [id number]   Subjective:   Eric Rose is a 61 y.o. male here for a complete physical exam  Interim issues since last visit: no issues He takes amoxicillin prior to dental visits He has HTN and takes medicine for that He sees neurologist and is on aricept  USPSTF grade A and B recommendations Depression:  Depression screen [facility] 2/9 02/21/2017 02/07/2016 08/06/2015 12/15/2014  Decreased Interest 0 0 0 0  Down, Depressed, Hopeless 0 0 1 0  PHQ - 2 Score 0 0 1 0   Hypertension: BP Readings from Last 3 Encounters:  02/21/17 126/74  10/03/16 (!) 147/90  02/07/16 124/76   Obesity: Wt Readings from Last 3 Encounters:  02/21/17 194 lb 12.8 oz (88.4 kg)  10/03/16 197 lb 6.4 oz (89.5 kg)  02/07/16 192 lb (87.1 kg)   BMI Readings from Last 3 Encounters:  02/21/17 28.46 kg/m  10/03/16 27.53 kg/m  02/07/16 28.35 kg/m     Skin cancer: no worrisome moles Lung cancer:  n/a Prostate cancer: seeing urologist yearly No results found for: PSA Colorectal cancer: done in 2014;   AAA: n/a Aspirin: taking daily, no bleeding Diet: oatmeal; pretty good eater Exercise: staying active; job is physical job Alcohol: no Tobacco use: nonsomker HIV, hep B, hep C: n/a STD testing and prevention (chl/gon/syphilis): n/a  Lipids:  Lab Results  Component Value Date   CHOL 164 02/07/2016   CHOL 168 08/06/2015   Lab Results  Component Value Date   HDL 55 02/07/2016   HDL 60 08/06/2015   Lab Results  Component Value Date   LDLCALC 101 (H) 02/07/2016   [place] 100 (H) 08/06/2015   Lab Results  Component Value Date   TRIG 41 02/07/2016   TRIG 40 08/06/2015   Lab Results  Component Value Date   CHOLHDL 3.0 02/07/2016   No results found for: LDLDIRECT Glucose:  Glucose  Date Value Ref Range Status  12/18/2013 94 65 - 99 mg/dL Final  12/01/2013 89 65 - 99 mg/dL Final   Glucose, Bld  Date Value Ref  Range Status  02/07/2016 81 65 - 99 mg/dL Final  04/15/2015 106 (H) 65 - 99 mg/dL Final  03/31/2015 101 (H) 65 - 99 mg/dL Final   Past Medical History:  Diagnosis Date  . Allergy   . Anxiety   . BPH (benign prostatic hypertrophy) with urinary obstruction    followed by urologist  . Decreased libido   . Dementia   . Erectile dysfunction 08/08/2015  . GERD (gastroesophageal reflux disease)   . History of pneumonia   . Hypertension   . Lumbago   . 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 JOINT REPLACEMENT Right    hip  . 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 Diabetes Mother   . Heart disease Mother   . Alzheimer's disease Mother   . Prostate cancer Brother   . Prostate cancer Paternal Uncle   . Bladder Cancer Neg Hx   . Kidney cancer Neg Hx    Social History   Tobacco Use  . Smoking status: Never Smoker  .  Smokeless tobacco: Never Used  Substance Use Topics  . Alcohol use: No    Alcohol/week: 0.0 oz  . Drug use: No   Review of Systems  Constitutional: Negative for diaphoresis and unexpected weight change.  Eyes:       Left eye trouble for years, hit playing tennis 15-20 years ago  Respiratory: Negative for shortness of breath.   Cardiovascular: Negative for chest pain.  Gastrointestinal: Negative for blood in stool.  Endocrine: Negative for polydipsia.  Genitourinary: Negative for hematuria.  Allergic/Immunologic: Negative for food allergies.  Hematological: Positive for adenopathy (LEFT under the angle of jaw).    Objective:   Vitals:   02/21/17 1326  BP: 126/74  Pulse: 92  Resp: 12  Temp: 97.6 F (36.4 C)  TempSrc: Oral  SpO2: 98%  Weight: 194 lb 12.8 oz (88.4  kg)  Height: 5' 9.38" (1.762 m)   Body mass index is 28.46 kg/m. Wt Readings from Last 3 Encounters:  02/21/17 194 lb 12.8 oz (88.4 kg)  10/03/16 197 lb 6.4 oz (89.5 kg)  02/07/16 192 lb (87.1 kg)   Physical Exam  Constitutional: He appears well-developed and well-nourished. No distress.  HENT:  Head: Normocephalic and atraumatic.  Nose: Nose normal.  Mouth/Throat: Oropharynx is clear and moist.  Eyes: EOM are normal. No scleral icterus.  Neck: No JVD present. No thyromegaly present.  Soft, mobile mass on the LEFT side under angle of jaw; nontender, no overlying skin changes  Cardiovascular: Normal rate, regular rhythm and normal heart sounds.  Pulmonary/Chest: Effort normal and breath sounds normal. No respiratory distress. He has no wheezes. He has no rales.  Abdominal: Soft. Bowel sounds are normal. He exhibits no distension. There is no tenderness. There is no guarding.  Musculoskeletal: Normal range of motion. He exhibits no edema.  Lymphadenopathy:    He has no cervical adenopathy.  Neurological: He is alert. He displays normal reflexes. He exhibits normal muscle tone. Coordination normal.  Skin: Skin is warm and dry. No rash noted. He is not diaphoretic. No erythema. No pallor.  Psychiatric: He has a normal mood and affect. His behavior is normal. Judgment and thought content normal. His mood appears not anxious. He does not exhibit a depressed mood.    Assessment/Plan:   Problem List Items Addressed This Visit      Cardiovascular and Mediastinum   Essential hypertension, benign    Controlled; he asked if okay to take his BP meds with other medicines; yes      Relevant Medications   aspirin EC 81 MG tablet     Genitourinary   BPH with obstruction/lower urinary tract symptoms    Managed by urologist      Relevant Medications   Dutasteride-Tamsulosin HCl 0.5-0.4 MG CAPS     Other   Vision loss of left eye    No change      S/P total hip arthroplasty     Needs to have antibiotic prophylaxis prior to dental work      Preventative health care - Primary    USPSTF grade A and B recommendations reviewed with patient; age-appropriate recommendations, preventive care, screening tests, etc discussed and encouraged; healthy living encouraged; see AVS for patient education given to patient      Relevant Orders   CBC with Differential/Platelet   COMPLETE METABOLIC PANEL WITH GFR   Lipid panel   TSH   Lump in neck    Patient does not think this has grown since 2010; reviewed CT  scan of neck; he says he was evaluated by ENT already; staff cannot locate notes; will request notes again; he will watch this area and notify me of any change; I wrote in the AVS for patient to call me in one week if he has not heard back after review of ENT note      Elevated PSA    Monitored by urologist         No orders of the defined types were placed in this encounter.  Orders Placed This Encounter  Procedures  . CBC with Differential/Platelet  . COMPLETE METABOLIC PANEL WITH GFR  . Lipid panel  . TSH    Follow up plan: Return in about 1 year (around 02/21/2018) for complete physical.  An After Visit Summary was printed and given to the patient.

## 2017-02-21 NOTE — Assessment & Plan Note (Signed)
USPSTF grade A and B recommendations reviewed with patient; age-appropriate recommendations, preventive care, screening tests, etc discussed and encouraged; healthy living encouraged; see AVS for patient education given to patient  

## 2017-02-21 NOTE — Assessment & Plan Note (Signed)
No change 

## 2017-02-21 NOTE — Patient Instructions (Addendum)
Please feel free to check with your pharmacy about the new shingles vaccine called Shingrix  If you have not heard anything from my staff in a week about any orders/referrals/studies from today, please contact us here to follow-up [phone]  We'll try to get records from your [place] doctor, so please sign a release up front to get that information If you've not heard from [place] about the swollen node in your neck in one week, please call  Health Maintenance, Male A healthy lifestyle and preventive care is important for your health and wellness. Ask your health care provider about what schedule of regular examinations is right for you. What should I know about weight and diet? Eat a Healthy Diet  Eat plenty of vegetables, fruits, whole grains, low-fat dairy products, and lean protein.  Do not eat a lot of foods high in solid fats, added sugars, or salt.  Maintain a Healthy Weight Regular exercise can help you achieve or maintain a healthy weight. You should:  Do at least 150 minutes of exercise each week. The exercise should increase your heart rate and make you sweat (moderate-intensity exercise).  Do strength-training exercises at least twice a week.  Watch Your Levels of Cholesterol and Blood Lipids  Have your blood tested for lipids and cholesterol every 5 years starting at 63 years of age. If you are at high risk for heart disease, you should start having your blood tested when you are 63 years old. You may need to have your cholesterol levels checked more often if: ? Your lipid or cholesterol levels are high. ? You are older than 63 years of age. ? You are at high risk for heart disease.  What should I know about cancer screening? Many types of cancers can be detected early and may often be prevented. Lung Cancer  You should be screened every year for lung cancer if: ? You are a current smoker who has smoked for at least 30 years. ? You are a former smoker who has  quit within the past 15 years.  Talk to your health care provider about your screening options, when you should start screening, and how often you should be screened.  Colorectal Cancer  Routine colorectal cancer screening usually begins at 63 years of age and should be repeated every 5-10 years until you are 63 years old. You may need to be screened more often if early forms of precancerous polyps or small growths are found. Your health care provider may recommend screening at an earlier age if you have risk factors for colon cancer.  Your health care provider may recommend using home test kits to check for hidden blood in the stool.  A small camera at the end of a tube can be used to examine your colon (sigmoidoscopy or colonoscopy). This checks for the earliest forms of colorectal cancer.  Prostate and Testicular Cancer  Depending on your age and overall health, your health care provider may do certain tests to screen for prostate and testicular cancer.  Talk to your health care provider about any symptoms or concerns you have about testicular or prostate cancer.  Skin Cancer  Check your skin from head to toe regularly.  Tell your health care provider about any new moles or changes in moles, especially if: ? There is a change in a mole's size, shape, or color. ? You have a mole that is larger than a pencil eraser.  Always use sunscreen. Apply sunscreen liberally and  repeat throughout the day.  Protect yourself by wearing long sleeves, pants, a wide-brimmed hat, and sunglasses when outside.  What should I know about heart disease, diabetes, and high blood pressure?  If you are 80-87 years of age, have your blood pressure checked every 3-5 years. If you are 34 years of age or older, have your blood pressure checked every year. You should have your blood pressure measured twice-once when you are at a hospital or clinic, and once when you are not at a hospital or clinic. Record the  average of the two measurements. To check your blood pressure when you are not at a hospital or clinic, you can use: ? An automated blood pressure machine at a pharmacy. ? A home blood pressure monitor.  Talk to your health care provider about your target blood pressure.  If you are between 62-45 years old, ask your health care provider if you should take aspirin to prevent heart disease.  Have regular diabetes screenings by checking your fasting blood sugar level. ? If you are at a normal weight and have a low risk for diabetes, have this test once every three years after the age of 59. ? If you are overweight and have a high risk for diabetes, consider being tested at a younger age or more often.  A one-time screening for abdominal aortic aneurysm (AAA) by ultrasound is recommended for men aged 50-75 years who are current or former smokers. What should I know about preventing infection? Hepatitis B If you have a higher risk for hepatitis B, you should be screened for this virus. Talk with your health care provider to find out if you are at risk for hepatitis B infection. Hepatitis C Blood testing is recommended for:  Everyone born from [birth year] through 1967.  Anyone with known risk factors for hepatitis C.  Sexually Transmitted Diseases (STDs)  You should be screened each year for STDs including gonorrhea and chlamydia if: ? You are sexually active and are younger than 63 years of age. ? You are older than 63 years of age and your health care provider tells you that you are at risk for this type of infection. ? Your sexual activity has changed since you were last screened and you are at an increased risk for chlamydia or gonorrhea. Ask your health care provider if you are at risk.  Talk with your health care provider about whether you are at high risk of being infected with HIV. Your health care provider may recommend a prescription medicine to help prevent HIV infection.  What else can  I do?  Schedule regular health, dental, and eye exams.  Stay current with your vaccines (immunizations).  Do not use any tobacco products, such as cigarettes, chewing tobacco, and e-cigarettes. If you need help quitting, ask your health care provider.  Limit alcohol intake to no more than 2 drinks per day. One drink equals 12 ounces of beer, 5 ounces of wine, or 1 ounces of hard liquor.  Do not use street drugs.  Do not share needles.  Ask your health care provider for help if you need support or information about quitting drugs.  Tell your health care provider if you often feel depressed.  Tell your health care provider if you have ever been abused or do not feel safe at home. This information is not intended to replace advice given to you by your health care provider. Make sure you discuss any questions you have with your health care  provider. Document Released: 08/26/2007 Document Revised: 10/27/2015 Document Reviewed: 12/01/2014 Elsevier Interactive Patient Education  [employer].

## 2017-02-21 NOTE — Assessment & Plan Note (Signed)
Patient does not think this has grown since 2010; reviewed CT scan of neck; he says he was evaluated by ENT already; staff cannot locate notes; will request notes again; he will watch this area and notify me of any change; I wrote in the AVS for patient to call me in one week if he has not heard back after review of ENT note

## 2017-02-21 NOTE — Assessment & Plan Note (Signed)
Managed by urologist 

## 2017-02-21 NOTE — Assessment & Plan Note (Signed)
Needs to have antibiotic prophylaxis prior to dental work

## 2017-02-21 NOTE — Assessment & Plan Note (Signed)
Monitored by urologist 

## 2017-02-21 NOTE — Assessment & Plan Note (Signed)
Controlled; he asked if okay to take his BP meds with other medicines; yes

## 2017-02-22 ENCOUNTER — Telehealth: Payer: Self-pay | Admitting: Family Medicine

## 2017-02-22 ENCOUNTER — Other Ambulatory Visit: Payer: Self-pay | Admitting: Family Medicine

## 2017-02-22 LAB — COMPLETE METABOLIC PANEL WITH GFR
AG Ratio: 1.6 (calc) (ref 1.0–2.5)
ALBUMIN MSPROF: 3.9 g/dL (ref 3.6–5.1)
ALT: 21 U/L (ref 9–46)
AST: 31 U/L (ref 10–35)
Alkaline phosphatase (APISO): 69 U/L (ref 40–115)
BUN: 9 mg/dL (ref 7–25)
CALCIUM: 9.1 mg/dL (ref 8.6–10.3)
CO2: 29 mmol/L (ref 20–32)
CREATININE: 0.94 mg/dL (ref 0.70–1.25)
Chloride: 103 mmol/L (ref 98–110)
GFR, EST AFRICAN AMERICAN: 100 mL/min/{1.73_m2} (ref >=60)
GFR, Est Non African American: 86 mL/min/{1.73_m2} (ref >=60)
GLUCOSE: 97 mg/dL (ref 65–99)
Globulin: 2.5 g/dL (calc) (ref 1.9–3.7)
Potassium: 3.9 mmol/L (ref 3.5–5.3)
Sodium: 138 mmol/L (ref 135–146)
TOTAL PROTEIN: 6.4 g/dL (ref 6.1–8.1)
Total Bilirubin: 0.4 mg/dL (ref 0.2–1.2)

## 2017-02-22 LAB — LIPID PANEL
CHOL/HDL RATIO: 3.2 (calc) (ref <5.0)
Cholesterol: 171 mg/dL (ref <200)
HDL: 53 mg/dL (ref >40)
LDL CHOLESTEROL (CALC): 105 mg/dL — AB
Non-HDL Cholesterol (Calc): 118 mg/dL (calc) (ref <130)
TRIGLYCERIDES: 52 mg/dL (ref <150)

## 2017-02-22 LAB — CBC WITH DIFFERENTIAL/PLATELET
BASOS PCT: 0.3 %
Basophils Absolute: 19 cells/uL (ref 0–200)
EOS ABS: 307 {cells}/uL (ref 15–500)
EOS PCT: 4.8 %
HCT: 42.2 % (ref 38.5–50.0)
HEMOGLOBIN: 14.1 g/dL (ref 13.2–17.1)
Lymphs Abs: 1440 cells/uL (ref 850–3900)
MCH: 30.6 pg (ref 27.0–33.0)
MCHC: 33.4 g/dL (ref 32.0–36.0)
MCV: 91.5 fL (ref 80.0–100.0)
MONOS PCT: 9.7 %
MPV: 10.9 fL (ref 7.5–12.5)
NEUTROS ABS: 4013 {cells}/uL (ref 1500–7800)
Neutrophils Relative %: 62.7 %
Platelets: 252 10*3/uL (ref 140–400)
RBC: 4.61 10*6/uL (ref 4.20–5.80)
RDW: 12.3 % (ref 11.0–15.0)
Total Lymphocyte: 22.5 %
WBC mixed population: 621 cells/uL (ref 200–950)
WBC: 6.4 10*3/uL (ref 3.8–10.8)

## 2017-02-22 LAB — TSH: TSH: 1.23 mIU/L (ref 0.40–4.50)

## 2017-02-22 MED ORDER — PRAVASTATIN SODIUM 20 MG PO TABS: 20.0000 mg | ORAL_TABLET | Freq: Every day | ORAL | 3 refills | Status: DC

## 2017-02-22 NOTE — Telephone Encounter (Signed)
Pt called back and results of his labs given to him with recommendations. Patient has agree to take the statin. He also wants Dr. Sanda Klein to know that he will also "stop eatiing fried chicken and start eating baked chicken".

## 2017-02-22 NOTE — Progress Notes (Signed)
Start low dose statin, least lipophilic

## 2017-03-26 ENCOUNTER — Other Ambulatory Visit: Payer: Self-pay

## 2017-03-26 DIAGNOSIS — R972 Elevated prostate specific antigen [PSA]: Secondary | ICD-10-CM

## 2017-03-27 ENCOUNTER — Other Ambulatory Visit: Payer: BLUE CROSS/BLUE SHIELD

## 2017-03-28 NOTE — Progress Notes (Signed)
4:01 PM   Eric Rose [birth year] [id number]  Referring provider: Arnetha Courser, MD [address] [place], [place] [address]  Chief Complaint  Patient presents with  . Elevated PSA    HPI: Patient is a 62 year old [place] American male with a history of an elevated PSA and  BPH with LUTS presents today for 1 year follow-up.    PSA Elevation Component     Latest Ref Rng & Units 12/29/2014 01/12/2015 04/08/2015 07/29/2015  Prostate Specific Ag, Serum     0.0 - 4.0 ng/mL 4.8 (H) 3.6 2.5 3.5   Component     Latest Ref Rng & Units 08/23/2015 10/03/2016  Prostate Specific Ag, Serum     0.0 - 4.0 ng/mL 4.9 (H) 2.6    BPH WITH LUTS His IPSS score today is 13, which is moderate lower urinary tract symptomatology.  His mixed with his quality life due to his urinary symptoms.   His previous was I PSS 5/3.  His major complaint today rare frequency, urgency and nocturia.  He has had these symptoms for over two years.  He denies any dysuria, hematuria or suprapubic pain.  He currently taking Jalyn.    He also denies any recent fevers, chills, nausea or vomiting.   He does not have a family history of PCa. IPSS    Row Name 03/29/17 1500         International Prostate Symptom Score   How often have you had the sensation of not emptying your bladder?  More than half the time     How often have you had to urinate less than every two hours?  Less than half the time     How often have you found you stopped and started again several times when you urinated?  Not at All     How often have you found it difficult to postpone urination?  Less than half the time     How often have you had a weak urinary stream?  Less than 1 in 5 times     How often have you had to strain to start urination?  Not at All     How many times did you typically get up at night to urinate?  4 Times     Total IPSS Score  13       Quality of Life due to urinary symptoms   If you were to spend the rest of  your life with your urinary condition just the way it is now how would you feel about that?  Mixed        Score:  1-7 Mild 8-19 Moderate 20-35 Severe   PMH: Past Medical History:  Diagnosis Date  . Allergy   . Anxiety   . BPH (benign prostatic hypertrophy) with urinary obstruction    followed by urologist  . Decreased libido   . Dementia   . Erectile dysfunction 08/08/2015  . GERD (gastroesophageal reflux disease)   . History of pneumonia   . Hypertension   . Lumbago   . 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 JOINT REPLACEMENT Right    hip  . 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 Always use your most recent med list.          amoxicillin 500 MG capsule Commonly known as:  AMOXIL Take 4 capsules (2,000 mg total) by mouth once as needed. One hour prior to dental procedure   aspirin EC 81 MG tablet Take 1 tablet (81 mg total) by mouth daily.   CIALIS 5 MG tablet Generic drug:  tadalafil TAKE 1 TABLET BY MOUTH EVERY DAY FOR ERECTILE DYSFUNCTION.   donepezil 5 MG tablet Commonly known as:  ARICEPT Take 5 mg by mouth at bedtime.   Dutasteride-Tamsulosin HCl 0.5-0.4 MG Caps Take 1 tablet by mouth daily.   Fish Oil Oil Take 1,000 mg by mouth daily.   lisinopril 20 MG tablet Commonly known as:  PRINIVIL,ZESTRIL TAKE ONE TABLET BY MOUTH ONCE DAILY   multivitamin capsule Take 1 capsule by mouth daily.   pravastatin 20 MG tablet Commonly known as:  PRAVACHOL Take 1 tablet (20 mg total) by mouth at bedtime.   VITAMIN D (ERGOCALCIFEROL) PO Take 1,000 mg by mouth  daily.       Allergies: No Known Allergies  Family History: Family History  Problem Relation Age of Onset  . Diabetes Mother   . Heart disease Mother   . Alzheimer's disease Mother   . Prostate cancer Brother   . Prostate cancer Paternal Uncle   . Bladder Cancer Neg Hx   . Kidney cancer Neg Hx     Social History:  reports that  has never smoked. he has never used smokeless tobacco. He reports that he does not drink alcohol or use drugs.  ROS: UROLOGY Frequent Urination?: Yes Hard to postpone urination?: Yes Burning/pain with urination?: No Get up at night to urinate?: Yes Leakage of urine?: No Urine stream starts and stops?: No Trouble starting stream?: No Do you have to strain to urinate?: No Blood in urine?: No Urinary tract infection?: No Sexually transmitted disease?: No Injury to kidneys or bladder?: No Painful intercourse?: No Weak stream?: No Erection problems?: Yes Penile pain?: No  Gastrointestinal Nausea?: No Vomiting?: No Indigestion/heartburn?: No Diarrhea?: No Constipation?: No  Constitutional Fever: No Night sweats?: No Weight loss?: No Fatigue?: No  Skin Skin rash/lesions?: No Itching?: No  Eyes Blurred vision?: Yes Double vision?: No  Ears/Nose/Throat Sore throat?: No Sinus problems?: No  Hematologic/Lymphatic Swollen glands?: No Easy bruising?: No  Cardiovascular Leg swelling?: No Chest pain?: No  Respiratory Cough?: No Shortness of breath?: No  Endocrine Excessive thirst?: No  Musculoskeletal Back pain?: No Joint pain?: Yes  Neurological Headaches?: No Dizziness?: No  Psychologic Depression?: No Anxiety?: No  Physical Exam: BP 123/79   Pulse (!) 109   Ht 5\' 9"  (1.753 m)   Wt 180 lb (81.6 kg)   BMI 26.58 kg/m   Constitutional: Well nourished. Alert and oriented, No acute distress. HEENT: [place] AT, moist mucus membranes. Trachea midline, no masses. Cardiovascular: No clubbing, cyanosis, or  edema. Respiratory: Normal respiratory effort, no increased work of breathing. GI: Abdomen is soft, non tender, non distended, no abdominal masses. Liver and spleen not palpable.  No hernias appreciated.  Stool sample for occult testing is not indicated.   GU: No CVA tenderness.  No bladder fullness or masses.  Patient with uncircumcised phallus.   Foreskin easily retracted  Urethral meatus is patent.  No penile  discharge. No penile lesions or rashes. Scrotum without lesions, cysts, rashes and/or edema.  Testicles are located scrotally bilaterally. No masses are appreciated in the testicles. Left and right epididymis are normal. Rectal: Patient with  normal sphincter tone. Anus and perineum without scarring or rashes. No rectal masses are appreciated. Prostate is approximately 55 grams, no nodules are appreciated. Seminal vesicles are normal. Skin: No rashes, bruises or suspicious lesions. Lymph: No cervical or inguinal adenopathy. Neurologic: Grossly intact, no focal deficits, moving all 4 extremities. Psychiatric: Normal mood and affect.   Laboratory Data: Lab Results  Component Value Date   WBC 6.4 02/21/2017   HGB 14.1 02/21/2017   HCT 42.2 02/21/2017   MCV 91.5 02/21/2017   PLT 252 02/21/2017    Lab Results  Component Value Date   CREATININE 0.94 02/21/2017   PSA History:  4.0 ng/mL on 06/17/2012-started on Jalyn  2.0 ng/mL on 07/22/2012  4.8 ng/mL on 12/29/2014-restarted on Jalyn  3.6 ng/mL on 01/12/2015  2.5 ng/mL on 04/08/2015  3.5 ng/mL on 07/29/2015  4.9 ng/mL on 08/23/2015  2.8 ng/mL on 10/03/2016  I have reviewed the labs  Assessment & Plan:    1. History of elevated PSA  - PSA drawn today - if stable RTC in 6 months    2. BPH with LUTS  - IPSS score is 13/3, it is worsening  - Continue conservative management, avoiding bladder irritants and timed voiding's  - most bothersome symptoms is/are frequency, urgency nocturia -- he does not find them bothersome  enough to pursue further treatment at this time   - Continue tamsulosin/dutasteride 0.5 mg daily:  refills given  - RTC in 6 months for IPSS, PSA and exam - if PSA is stable   Return in about 6 months (around 09/26/2017) for IPSS, PSA and exam.  Zara Council, [facility]  [place] [address], [place], [place] [address] [phone]

## 2017-03-29 ENCOUNTER — Ambulatory Visit (INDEPENDENT_AMBULATORY_CARE_PROVIDER_SITE_OTHER): Payer: Managed Care, Other (non HMO) | Admitting: Urology

## 2017-03-29 ENCOUNTER — Encounter: Payer: Self-pay | Admitting: Urology

## 2017-03-29 VITALS — BP 123/79 | HR 109 | Ht 69.0 in | Wt 180.0 lb

## 2017-03-29 DIAGNOSIS — N138 Other obstructive and reflux uropathy: Secondary | ICD-10-CM | POA: Diagnosis not present

## 2017-03-29 DIAGNOSIS — N401 Enlarged prostate with lower urinary tract symptoms: Secondary | ICD-10-CM | POA: Diagnosis not present

## 2017-03-29 DIAGNOSIS — Z87898 Personal history of other specified conditions: Secondary | ICD-10-CM | POA: Diagnosis not present

## 2017-03-29 MED ORDER — DUTASTERIDE-TAMSULOSIN HCL 0.5-0.4 MG PO CAPS: 1.0000 | ORAL_CAPSULE | Freq: Every day | ORAL | 11 refills | Status: DC

## 2017-03-30 ENCOUNTER — Encounter: Payer: Self-pay | Admitting: Family Medicine

## 2017-03-30 LAB — PSA: Prostate Specific Ag, Serum: 1.5 ng/mL (ref 0.0–4.0)

## 2017-04-16 ENCOUNTER — Other Ambulatory Visit: Payer: Self-pay | Admitting: Family Medicine

## 2017-04-16 NOTE — Telephone Encounter (Signed)
Last Cr and K+ reviewed; Rx approved 

## 2017-05-25 ENCOUNTER — Ambulatory Visit: Payer: Self-pay

## 2017-05-25 NOTE — Telephone Encounter (Signed)
Pt asking if his generic Cialis can cause cancer, and if it can be cut. Looked at  [employer] and did not see that it could cause cancer.  His generic Cialis can be cut, but manufacturer does not recommend it. Pt informed

## 2017-07-23 ENCOUNTER — Other Ambulatory Visit: Payer: Self-pay | Admitting: Family Medicine

## 2017-07-23 NOTE — Telephone Encounter (Signed)
Too soon for lisinopril refills; 6 month supply approved on Apr 16, 2017

## 2017-08-05 ENCOUNTER — Other Ambulatory Visit: Payer: Self-pay | Admitting: Family Medicine

## 2017-09-25 NOTE — Progress Notes (Deleted)
7:22 PM   Eric Rose 05-02-1955 [id number]  Referring provider: Arnetha Courser, [place], [place] [address]  No chief complaint on file.   HPI: Patient is a 62 year old African American male with a history of an elevated PSA and  BPH with LUTS presents today for 6 month follow-up.    History of elevated PSA PSA Trend  4.0 in 06/17/2012 - started on tamsulosin/dutasteride  2.0 (4.0) in 07/22/2012  4.8 in 12/29/2014 - restarted on tamsulosin/dutasteride  3.6 (6.2) in 01/12/2015  2.5 (5.0) in 04/08/2015  3.5 (7.0) in 07/29/2015  4.9 in 08/23/2015 - restarted on tamsulosin/dutasteride   2.6 (5.2) in 10/03/2016  1.5 (3.0) in 03/29/2017  BPH WITH LUTS His IPSS score today is ***, which is *** lower urinary tract symptomatology.  His *** with his quality life due to his urinary symptoms.   His previous was I PSS 13/3.  His major complaint today rare frequency, urgency and nocturia.  He has had these symptoms for over two years.  He denies any dysuria, hematuria or suprapubic pain.  He currently taking Jalyn.    He also denies any recent fevers, chills, nausea or vomiting.   He does not have a family history of PCa.   Score:  1-7 Mild 8-19 Moderate 20-35 Severe   PMH: Past Medical History:  Diagnosis Date  . Allergy   . Anxiety   . BPH (benign prostatic hypertrophy) with urinary obstruction    followed by urologist  . Decreased libido   . Dementia   . Erectile dysfunction 08/08/2015  . GERD (gastroesophageal reflux disease)   . History of pneumonia   . Hypertension   . Lumbago   . 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 JOINT REPLACEMENT Right    hip  . 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 Always use your most recent med list.          amoxicillin 500 MG capsule Commonly known as:  AMOXIL Take 4 capsules (2,000 mg total) by mouth once as needed. One hour prior to dental procedure   aspirin EC 81 MG tablet Take 1 tablet (81 mg total) by mouth daily.   donepezil 5 MG tablet Commonly known as:  ARICEPT Take 5 mg by mouth at bedtime.   Dutasteride-Tamsulosin HCl 0.5-0.4 MG Caps Take 1 tablet by mouth daily.   Fish Oil Oil Take 1,000 mg by mouth daily.   lisinopril 20 MG tablet Commonly known as:  PRINIVIL,ZESTRIL TAKE 1 TABLET BY MOUTH ONCE DAILY   multivitamin capsule Take 1 capsule by mouth daily.   pravastatin 20 MG tablet Commonly known as:  PRAVACHOL Take 1 tablet (20 mg total) by mouth at bedtime.   tadalafil 5 MG tablet Commonly known as:  CIALIS TAKE 1 TABLET BY MOUTH EVERY DAY FOR ERECTILE DYSFUNCTION.   VITAMIN D (ERGOCALCIFEROL) PO Take 1,000 mg by mouth daily.       Allergies: No Known Allergies  Family History: Family History  Problem Relation  Age of Onset  . Diabetes Mother   . Heart disease Mother   . Alzheimer's disease Mother   . Prostate cancer Brother   . Prostate cancer Paternal Uncle   . Bladder Cancer Neg Hx   . Kidney cancer Neg Hx     Social History:  reports that he has never smoked. He has never used smokeless tobacco. He reports that he does not drink alcohol or use drugs.  ROS:                                        Physical Exam: There were no vitals taken for this visit.  Constitutional: Well nourished. Alert and oriented, No acute distress. HEENT: [place] AT, moist mucus membranes. Trachea midline, no masses. Cardiovascular: No clubbing, cyanosis, or edema. Respiratory: Normal respiratory effort, no increased work of breathing. GI:  Abdomen is soft, non tender, non distended, no abdominal masses. Liver and spleen not palpable.  No hernias appreciated.  Stool sample for occult testing is not indicated.   GU: No CVA tenderness.  No bladder fullness or masses.  Patient with uncircumcised phallus.   Foreskin easily retracted  Urethral meatus is patent.  No penile discharge. No penile lesions or rashes. Scrotum without lesions, cysts, rashes and/or edema.  Testicles are located scrotally bilaterally. No masses are appreciated in the testicles. Left and right epididymis are normal. Rectal: Patient with  normal sphincter tone. Anus and perineum without scarring or rashes. No rectal masses are appreciated. Prostate is approximately 55 grams, no nodules are appreciated. Seminal vesicles are normal. Skin: No rashes, bruises or suspicious lesions. Lymph: No cervical or inguinal adenopathy. Neurologic: Grossly intact, no focal deficits, moving all 4 extremities. Psychiatric: Normal mood and affect. ***  Constitutional: Well nourished. Alert and oriented, No acute distress. HEENT: [place] AT, moist mucus membranes. Trachea midline, no masses. Cardiovascular: No clubbing, cyanosis, or edema. Respiratory: Normal respiratory effort, no increased work of breathing. GI: Abdomen is soft, non tender, non distended, no abdominal masses. Liver and spleen not palpable.  No hernias appreciated.  Stool sample for occult testing is not indicated.   GU: No CVA tenderness.  No bladder fullness or masses.  Patient with circumcised/uncircumcised phallus. ***Foreskin easily retracted***  Urethral meatus is patent.  No penile discharge. No penile lesions or rashes. Scrotum without lesions, cysts, rashes and/or edema.  Testicles are located scrotally bilaterally. No masses are appreciated in the testicles. Left and right epididymis are normal. Rectal: Patient with  normal sphincter tone. Anus and perineum without scarring or rashes. No rectal masses are appreciated.  Prostate is approximately *** grams, *** nodules are appreciated. Seminal vesicles are normal. Skin: No rashes, bruises or suspicious lesions. Lymph: No cervical or inguinal adenopathy. Neurologic: Grossly intact, no focal deficits, moving all 4 extremities. Psychiatric: Normal mood and affect.    Laboratory Data: Lab Results  Component Value Date   WBC 6.4 02/21/2017   HGB 14.1 02/21/2017   HCT 42.2 02/21/2017   MCV 91.5 02/21/2017   PLT 252 02/21/2017    Lab Results  Component Value Date   CREATININE 0.94 02/21/2017   I have reviewed the labs  Assessment & Plan:    1. History of elevated PSA PSA drawn today - if stable RTC in 6 months for PSA    2. BPH with LUTS  - IPSS score is 13/3, it is worsening  - Continue  conservative management, avoiding bladder irritants and timed voiding's  - most bothersome symptoms is/are frequency, urgency nocturia -- he does not find them bothersome enough to pursue further treatment at this time   - Continue tamsulosin/dutasteride 0.5 mg daily:  refills given  - RTC in 6 months for IPSS, PSA and exam - if PSA is stable   No follow-ups on file.  Zara Council, PA-C  [facility] [address] [place], [place] [address] [phone]

## 2017-09-26 ENCOUNTER — Ambulatory Visit: Payer: Managed Care, Other (non HMO) | Admitting: Urology

## 2017-09-27 ENCOUNTER — Encounter: Payer: Self-pay | Admitting: Urology

## 2017-10-16 ENCOUNTER — Other Ambulatory Visit: Payer: Self-pay | Admitting: Family Medicine

## 2017-11-07 ENCOUNTER — Telehealth: Payer: Self-pay | Admitting: Urology

## 2017-11-07 DIAGNOSIS — Z87898 Personal history of other specified conditions: Secondary | ICD-10-CM

## 2017-11-07 NOTE — Telephone Encounter (Signed)
Pt called to resch 6 month follow up w/Shannon.  He needs PSA prior, there is no order in.  He is coming tomorrow for PSA.

## 2017-11-08 ENCOUNTER — Other Ambulatory Visit: Payer: Managed Care, Other (non HMO)

## 2017-11-15 ENCOUNTER — Ambulatory Visit (INDEPENDENT_AMBULATORY_CARE_PROVIDER_SITE_OTHER): Payer: Managed Care, Other (non HMO) | Admitting: Urology

## 2017-11-15 ENCOUNTER — Encounter: Payer: Self-pay | Admitting: Urology

## 2017-11-15 DIAGNOSIS — Z87898 Personal history of other specified conditions: Secondary | ICD-10-CM

## 2017-11-15 MED ORDER — DUTASTERIDE-TAMSULOSIN HCL 0.5-0.4 MG PO CAPS: 1.0000 | ORAL_CAPSULE | Freq: Every day | ORAL | 11 refills | Status: DC

## 2017-11-15 NOTE — Progress Notes (Signed)
2:26 PM   Eric Rose [birth year] [id number]  Referring provider: Arnetha Courser, MD [address] [place], [place] [address]  Chief Complaint  Patient presents with  . Follow-up    HPI: Patient is a 62 year old African American male with a history of an elevated PSA and  BPH with LUTS presents today for 6 month follow up.      PSA Elevation Component     Latest Ref Rng & Units 12/29/2014 01/12/2015 04/08/2015 07/29/2015  Prostate Specific Ag, Serum     0.0 - 4.0 ng/mL 4.8 (H) 3.6 2.5 3.5   Component     Latest Ref Rng & Units 08/23/2015 10/03/2016  Prostate Specific Ag, Serum     0.0 - 4.0 ng/mL 4.9 (H) 2.6   1.5 (3.0) in 03/2017  BPH WITH LUTS His IPSS score today is 11, which is moderate lower urinary tract symptomatology.  His mixed with his quality life due to his urinary symptoms.   His previous was I PSS 13/3.  His no major complaint today.  He has had these symptoms for over two years.  He denies any dysuria, hematuria or suprapubic pain.  He currently taking Jalyn.    He also denies any recent fevers, chills, nausea or vomiting.   He does not have a family history of PCa. IPSS    Row Name 11/15/17 1400         International Prostate Symptom Score   How often have you had the sensation of not emptying your bladder?  Less than half the time     How often have you had to urinate less than every two hours?  Less than half the time     How often have you found you stopped and started again several times when you urinated?  Less than 1 in 5 times     How often have you found it difficult to postpone urination?  Less than half the time     How often have you had a weak urinary stream?  Less than 1 in 5 times     How often have you had to strain to start urination?  Less than 1 in 5 times     How many times did you typically get up at night to urinate?  2 Times     Total IPSS Score  11       Quality of Life due to urinary symptoms   If you were to spend  the rest of your life with your urinary condition just the way it is now how would you feel about that?  Mixed        Score:  1-7 Mild 8-19 Moderate 20-35 Severe   PMH: Past Medical History:  Diagnosis Date  . Allergy   . Anxiety   . BPH (benign prostatic hypertrophy) with urinary obstruction    followed by urologist  . Decreased libido   . Dementia   . Erectile dysfunction 08/08/2015  . GERD (gastroesophageal reflux disease)   . History of pneumonia   . Hypertension   . Lumbago   . 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 JOINT REPLACEMENT Right    hip  . 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   Procedure: TOTAL HIP ARTHROPLASTY;  Surgeon: Earnestine Leys, MD;  Location: [facility] ORS;  Service: Orthopedics;  Laterality: Left;    Home Medications:  Allergies as of 11/15/2017   No Known Allergies     Medication List        Accurate as of 11/15/17  2:26 PM. Always use your most recent med list.          amoxicillin 500 MG capsule Commonly known as:  AMOXIL Take 4 capsules (2,000 mg total) by mouth once as needed. One hour prior to dental procedure   aspirin EC 81 MG tablet Take 1 tablet (81 mg total) by mouth daily.   donepezil 5 MG tablet Commonly known as:  ARICEPT Take 5 mg by mouth at bedtime.   Dutasteride-Tamsulosin HCl 0.5-0.4 MG Caps Take 1 tablet by mouth daily.   Fish Oil Oil Take 1,000 mg by mouth daily.   GLUCOSAMINE 1500 COMPLEX PO Take by mouth.   lisinopril 20 MG tablet Commonly known as:  PRINIVIL,ZESTRIL TAKE 1 TABLET BY MOUTH ONCE DAILY   multivitamin capsule Take 1 capsule by mouth daily.   pravastatin 20 MG tablet Commonly known as:  PRAVACHOL Take 1 tablet (20 mg total) by mouth at bedtime.   tadalafil 5 MG tablet Commonly known as:  CIALIS TAKE 1 TABLET BY MOUTH EVERY DAY FOR ERECTILE DYSFUNCTION.     VITAMIN D (ERGOCALCIFEROL) PO Take 1,000 mg by mouth daily.       Allergies: No Known Allergies  Family History: Family History  Problem Relation Age of Onset  . Diabetes Mother   . Heart disease Mother   . Alzheimer's disease Mother   . Prostate cancer Brother   . Prostate cancer Paternal Uncle   . Bladder Cancer Neg Hx   . Kidney cancer Neg Hx     Social History:  reports that he has never smoked. He has never used smokeless tobacco. He reports that he does not drink alcohol or use drugs.  ROS: UROLOGY Frequent Urination?: No Hard to postpone urination?: No Burning/pain with urination?: No Get up at night to urinate?: No Leakage of urine?: No Urine stream starts and stops?: No Trouble starting stream?: No Do you have to strain to urinate?: No Blood in urine?: No Urinary tract infection?: No Sexually transmitted disease?: No Injury to kidneys or bladder?: No Painful intercourse?: No Weak stream?: No Erection problems?: No Penile pain?: No  Gastrointestinal Nausea?: No Vomiting?: No Indigestion/heartburn?: No Diarrhea?: No Constipation?: No  Constitutional Fever: No Night sweats?: No Weight loss?: No Fatigue?: No  Skin Skin rash/lesions?: No Itching?: No  Eyes Blurred vision?: No Double vision?: No  Ears/Nose/Throat Sore throat?: No Sinus problems?: No  Hematologic/Lymphatic Swollen glands?: No Easy bruising?: No  Cardiovascular Leg swelling?: No Chest pain?: No  Respiratory Cough?: No Shortness of breath?: No  Endocrine Excessive thirst?: No  Musculoskeletal Back pain?: No Joint pain?: No  Neurological Headaches?: No Dizziness?: No  Psychologic Depression?: No Anxiety?: No  Physical Exam: BP 138/84   Pulse 67   Temp 97.9 F (36.6 C)   Resp 14   Ht 5\' 9"  (1.753 m)   Wt 196 lb 12.8 oz (89.3 kg)   BMI 29.06 kg/m Constitutional: Well nourished. Alert and oriented, No acute distress. HEENT:  AT, moist mucus  membranes. Trachea midline, no masses. Cardiovascular: No clubbing, cyanosis, or edema. Respiratory: Normal respiratory effort, no increased work of breathing. GI: Abdomen is soft, non tender, non distended, no abdominal masses. Liver and spleen not palpable.  No hernias appreciated.  Stool sample for occult testing is not indicated.  GU: No CVA tenderness.  No bladder fullness or masses.  Patient with circumcised phallus.   Urethral meatus is patent.  No penile discharge. No penile lesions or rashes. Scrotum without lesions, cysts, rashes and/or edema.  Testicles are located scrotally bilaterally. No masses are appreciated in the testicles. Left and right epididymis are normal. Rectal: Patient with  normal sphincter tone. Anus and perineum without scarring or rashes. No rectal masses are appreciated. Prostate is approximately 50 grams, no nodules are appreciated. Seminal vesicles are normal. Skin: No rashes, bruises or suspicious lesions. Lymph: No cervical or inguinal adenopathy. Neurologic: Grossly intact, no focal deficits, moving all 4 extremities. Psychiatric: Normal mood and affect.     Laboratory Data: Lab Results  Component Value Date   WBC 6.4 02/21/2017   HGB 14.1 02/21/2017   HCT 42.2 02/21/2017   MCV 91.5 02/21/2017   PLT 252 02/21/2017    Lab Results  Component Value Date   CREATININE 0.94 02/21/2017   PSA History:  4.0 ng/mL on 06/17/2012-started on Jalyn  2.0 ng/mL on 07/22/2012  4.8 ng/mL on 12/29/2014-restarted on Jalyn  3.6 ng/mL on 01/12/2015  2.5 ng/mL on 04/08/2015  3.5 ng/mL on 07/29/2015  4.9 ng/mL on 08/23/2015  2.8 ng/mL on 10/03/2016  1.5 in 03/2017  I have reviewed the labs  Assessment & Plan:    1. History of elevated PSA  - PSA drawn today - if stable RTC in 6 months    2. BPH with LUTS IPSS score is 11/3, it is slightly improved Continue conservative management, avoiding bladder irritants and timed voiding's Continue  tamsulosin/dutasteride 0.5 mg daily:  refills given  - RTC in 6 months for IPSS, PSA and exam - if PSA is stable   Return in about 6 months (around 05/16/2018) for IPSS, PSA and exam.  Zara Council, [facility]  [place] [address] [place], [place] [address] [phone]

## 2017-11-16 ENCOUNTER — Telehealth: Payer: Self-pay

## 2017-11-16 LAB — PSA: PROSTATE SPECIFIC AG, SERUM: 0.8 ng/mL (ref 0.0–4.0)

## 2017-11-16 NOTE — Telephone Encounter (Signed)
-----   Message from Nori Riis, PA-C sent at 11/16/2017  7:35 AM EDT ----- Please let Mr. Mclean PSA was 0.8 which is great.  We will see him in 6 months.

## 2017-11-16 NOTE — Telephone Encounter (Signed)
Called pt, wife answers gave her results as she is listed on DPR.

## 2018-02-11 ENCOUNTER — Ambulatory Visit: Payer: Managed Care, Other (non HMO) | Admitting: Nurse Practitioner

## 2018-02-18 ENCOUNTER — Ambulatory Visit: Payer: Managed Care, Other (non HMO) | Admitting: Nurse Practitioner

## 2018-02-19 ENCOUNTER — Ambulatory Visit (INDEPENDENT_AMBULATORY_CARE_PROVIDER_SITE_OTHER): Payer: Managed Care, Other (non HMO) | Admitting: Nurse Practitioner

## 2018-02-19 ENCOUNTER — Encounter: Payer: Self-pay | Admitting: Nurse Practitioner

## 2018-02-19 VITALS — BP 134/78 | HR 76 | Temp 97.7°F | Resp 16 | Ht 69.0 in | Wt 195.7 lb

## 2018-02-19 DIAGNOSIS — R221 Localized swelling, mass and lump, neck: Secondary | ICD-10-CM | POA: Diagnosis not present

## 2018-02-19 NOTE — Patient Instructions (Signed)
-   We are going to get an ultrasound of your neck to take a look at the lump there and see what it might be. You should receive a phone call within the next 2-3 weeks to schedule that.  - If you develop trouble swallowing, or it increases in size, develops redness, fevers, or drainage please get urgent medical attention

## 2018-02-19 NOTE — Progress Notes (Signed)
Name: Eric Rose   MRN: [id number]    DOB: 1956/01/21   Date:02/19/2018       Progress Note  Subjective  Chief Complaint  Chief Complaint  Patient presents with  . Knot on neck    HPI  Left neck lump noted at least a few weeks ago, noticed by dentist last week and requested evaluation. No problems swallowing, no pain, non-tender. He thinks it was a little bigger when he noticed it first but not sure if it was. No fevers, chills, redness or draining.   Patient Active Problem List   Diagnosis Date Noted  . Lump in neck 02/21/2017  . 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 Memory impairment 02/07/2016  . Preventative health care 02/07/2016  . Erectile dysfunction 08/08/2015  . Hypocalcemia 08/06/2015  . Lipid screening 08/06/2015  . Anemia 08/06/2015  . Essential hypertension, benign 08/06/2015  . 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 Elevated PSA 01/12/2015  . BPH with obstruction/lower urinary tract symptoms 01/12/2015    Past Medical History:  Diagnosis Date  . Allergy   . Anxiety   . BPH (benign prostatic hypertrophy) with urinary obstruction    followed by urologist  . Decreased libido   . Dementia ([place])   . Erectile dysfunction 08/08/2015  . GERD (gastroesophageal reflux disease)   . History of pneumonia   . Hypertension   . Lumbago   . 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 JOINT REPLACEMENT Right    hip  . 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   Procedure: TOTAL HIP ARTHROPLASTY;  Surgeon: Earnestine Leys, MD;  Location: [facility] ORS;  Service: Orthopedics;  Laterality: Left;    Social History   Tobacco Use  . Smoking status: Never Smoker  . Smokeless tobacco: Never Used  Substance Use Topics  . Alcohol use: No    Alcohol/week: 0.0 standard drinks     Current Outpatient Medications:  .  amoxicillin (AMOXIL) 500 MG capsule, Take 4  capsules (2,000 mg total) by mouth once as needed. One hour prior to dental procedure, Disp: , Rfl:  .  aspirin EC 81 MG tablet, Take 1 tablet (81 mg total) by mouth daily., Disp: , Rfl:  .  donepezil (ARICEPT) 5 MG tablet, Take 5 mg by mouth at bedtime., Disp: , Rfl:  .  Dutasteride-Tamsulosin HCl 0.5-0.4 MG CAPS, Take 1 tablet by mouth daily., Disp: 30 capsule, Rfl: 11 .  Fish Oil OIL, Take 1,000 mg by mouth daily. , Disp: , Rfl:  .  Glucosamine-Chondroit-Vit C-Mn (GLUCOSAMINE 1500 COMPLEX PO), Take by mouth., Disp: , Rfl:  .  lisinopril (PRINIVIL,ZESTRIL) 20 MG tablet, TAKE 1 TABLET BY MOUTH ONCE DAILY, Disp: 90 tablet, Rfl: 1 .  Multiple Vitamin (MULTIVITAMIN) capsule, Take 1 capsule by mouth daily., Disp: , Rfl:  .  pravastatin (PRAVACHOL) 20 MG tablet, Take 1 tablet (20 mg total) by mouth at bedtime., Disp: 90 tablet, Rfl: 3 .  tadalafil (CIALIS) 5 MG tablet, TAKE 1 TABLET BY MOUTH EVERY DAY FOR ERECTILE DYSFUNCTION., Disp: 30 tablet, Rfl: 2 .  VITAMIN D, ERGOCALCIFEROL, PO, Take 1,000 mg by mouth daily. , Disp: , Rfl:   No Known Allergies  ROS   No other specific complaints in a complete review of systems (except as listed in HPI above).  Objective  Vitals:   02/19/18 1236  BP:  134/78  Pulse: 76  Resp: 16  Temp: 97.7 F (36.5 C)  TempSrc: Oral  SpO2: 99%  Weight: 195 lb 11.2 oz (88.8 kg)  Height: 5\' 9"  (1.753 m)    Body mass index is 28.9 kg/m.  Nursing Note and Vital Signs reviewed.  Physical Exam  Constitutional: He is oriented to person, place, and time. He appears well-developed and well-nourished.  HENT:  Mouth/Throat: Oropharynx is clear and moist. No oropharyngeal exudate.  Eyes: Pupils are equal, round, and reactive to light. Conjunctivae are normal.  Neck: Normal range of motion. Neck supple. No JVD present. No tracheal deviation present. No thyromegaly present.    Cardiovascular: Normal rate.  Pulmonary/Chest: Effort normal.  Lymphadenopathy:    He  has no cervical adenopathy.  Neurological: He is oriented to person, place, and time.  Skin: Skin is warm, dry and intact.     No results found for this or any previous visit (from the past 48 hour(s)).  Assessment & Plan  1. Nodule of neck - We are going to get an ultrasound of your neck to take a look at the lump there and see what it might be. You should receive a phone call within the next 2-3 weeks to schedule that.  - If you develop trouble swallowing, or it increases in size, develops redness, fevers, or drainage please get urgent medical attention  - US Soft Tissue Head/Neck; Future  Chart review notes that this lump has been noted in 2010 and was evaluated by ENT- no ENT noted located due to reported change in size will get repeat imaging

## 2018-02-25 ENCOUNTER — Ambulatory Visit: Payer: Managed Care, Other (non HMO)

## 2018-02-25 ENCOUNTER — Other Ambulatory Visit: Payer: Self-pay

## 2018-02-25 ENCOUNTER — Encounter: Payer: BLUE CROSS/BLUE SHIELD | Admitting: Family Medicine

## 2018-02-25 MED ORDER — DUTASTERIDE-TAMSULOSIN HCL 0.5-0.4 MG PO CAPS: 1.0000 | ORAL_CAPSULE | Freq: Every day | ORAL | 11 refills | Status: DC

## 2018-02-26 ENCOUNTER — Other Ambulatory Visit: Payer: Self-pay | Admitting: Family Medicine

## 2018-02-26 NOTE — Telephone Encounter (Signed)
Lab Results  Component Value Date   ALT 21 02/21/2017   Lab Results  Component Value Date   CHOL 171 02/21/2017   HDL 53 02/21/2017   LDLCALC 105 (H) 02/21/2017   TRIG 52 02/21/2017   CHOLHDL 3.2 02/21/2017   Patient has an appt at the end of the month; Rx approved

## 2018-03-12 ENCOUNTER — Encounter: Payer: Self-pay | Admitting: Family Medicine

## 2018-03-12 ENCOUNTER — Ambulatory Visit (INDEPENDENT_AMBULATORY_CARE_PROVIDER_SITE_OTHER): Payer: Managed Care, Other (non HMO) | Admitting: Family Medicine

## 2018-03-12 VITALS — BP 110/68 | HR 79 | Temp 97.3°F | Ht 68.5 in | Wt 193.3 lb

## 2018-03-12 DIAGNOSIS — Z23 Encounter for immunization: Secondary | ICD-10-CM | POA: Diagnosis not present

## 2018-03-12 DIAGNOSIS — Z0000 Encounter for general adult medical examination without abnormal findings: Secondary | ICD-10-CM

## 2018-03-12 LAB — CBC WITH DIFFERENTIAL/PLATELET
Absolute Monocytes: 687 cells/uL (ref 200–950)
Basophils Absolute: 32 cells/uL (ref 0–200)
Basophils Relative: 0.4 %
Eosinophils Absolute: 411 cells/uL (ref 15–500)
Eosinophils Relative: 5.2 %
HCT: 40.6 % (ref 38.5–50.0)
Hemoglobin: 13.8 g/dL (ref 13.2–17.1)
Lymphs Abs: 2030 cells/uL (ref 850–3900)
MCH: 31.3 pg (ref 27.0–33.0)
MCHC: 34 g/dL (ref 32.0–36.0)
MCV: 92.1 fL (ref 80.0–100.0)
MPV: 11.1 fL (ref 7.5–12.5)
Monocytes Relative: 8.7 %
NEUTROS PCT: 60 %
Neutro Abs: 4740 cells/uL (ref 1500–7800)
Platelets: 240 10*3/uL (ref 140–400)
RBC: 4.41 10*6/uL (ref 4.20–5.80)
RDW: 12 % (ref 11.0–15.0)
Total Lymphocyte: 25.7 %
WBC: 7.9 10*3/uL (ref 3.8–10.8)

## 2018-03-12 LAB — COMPLETE METABOLIC PANEL WITH GFR
AG Ratio: 1.8 (calc) (ref 1.0–2.5)
ALT: 21 U/L (ref 9–46)
AST: 23 U/L (ref 10–35)
Albumin: 4.1 g/dL (ref 3.6–5.1)
Alkaline phosphatase (APISO): 68 U/L (ref 40–115)
BUN: 12 mg/dL (ref 7–25)
CALCIUM: 9.1 mg/dL (ref 8.6–10.3)
CO2: 29 mmol/L (ref 20–32)
Chloride: 103 mmol/L (ref 98–110)
Creat: 0.93 mg/dL (ref 0.70–1.25)
GFR, EST NON AFRICAN AMERICAN: 86 mL/min/{1.73_m2} (ref >=60)
GFR, Est African American: 100 mL/min/{1.73_m2} (ref >=60)
Globulin: 2.3 g/dL (calc) (ref 1.9–3.7)
Glucose, Bld: 85 mg/dL (ref 65–99)
Potassium: 4 mmol/L (ref 3.5–5.3)
Sodium: 141 mmol/L (ref 135–146)
Total Bilirubin: 0.3 mg/dL (ref 0.2–1.2)
Total Protein: 6.4 g/dL (ref 6.1–8.1)

## 2018-03-12 LAB — LIPID PANEL
Cholesterol: 141 mg/dL (ref <200)
HDL: 54 mg/dL (ref >40)
LDL Cholesterol (Calc): 74 mg/dL (calc)
NON-HDL CHOLESTEROL (CALC): 87 mg/dL (ref <130)
Total CHOL/HDL Ratio: 2.6 (calc) (ref <5.0)
Triglycerides: 45 mg/dL (ref <150)

## 2018-03-12 LAB — TSH: TSH: 2.09 mIU/L (ref 0.40–4.50)

## 2018-03-12 NOTE — Patient Instructions (Addendum)
Consider getting the new shingles vaccine called Shingrix; that is available for individuals 64 years of age and older, and is recommended even if you have had shingles in the past and/or already received the old shingles vaccine (Zostavax); it is a two-part series, and is available at many local pharmacies  You received the vaccine to protect against tetanus and diphtheria and pertussis today; the tetanus and diphtheria portions will provide protection up to ten years, and the pertussis component will give you protection against whooping cough for life   Health Maintenance, Male A healthy lifestyle and preventive care is important for your health and wellness. Ask your health care provider about what schedule of regular examinations is right for you. What should I know about weight and diet? Eat a Healthy Diet  Eat plenty of vegetables, fruits, whole grains, low-fat dairy products, and lean protein.  Do not eat a lot of foods high in solid fats, added sugars, or salt.  Maintain a Healthy Weight Regular exercise can help you achieve or maintain a healthy weight. You should:  Do at least 150 minutes of exercise each week. The exercise should increase your heart rate and make you sweat (moderate-intensity exercise).  Do strength-training exercises at least twice a week. Watch Your Levels of Cholesterol and Blood Lipids  Have your blood tested for lipids and cholesterol every 5 years starting at 64 years of age. If you are at high risk for heart disease, you should start having your blood tested when you are 64 years old. You may need to have your cholesterol levels checked more often if: ? Your lipid or cholesterol levels are high. ? You are older than 64 years of age. ? You are at high risk for heart disease. What should I know about cancer screening? Many types of cancers can be detected early and may often be prevented. Lung Cancer  You should be screened every year for lung cancer  if: ? You are a current smoker who has smoked for at least 30 years. ? You are a former smoker who has quit within the past 15 years.  Talk to your health care provider about your screening options, when you should start screening, and how often you should be screened. Colorectal Cancer  Routine colorectal cancer screening usually begins at 64 years of age and should be repeated every 5-10 years until you are 64 years old. You may need to be screened more often if early forms of precancerous polyps or small growths are found. Your health care provider may recommend screening at an earlier age if you have risk factors for colon cancer.  Your health care provider may recommend using home test kits to check for hidden blood in the stool.  A small camera at the end of a tube can be used to examine your colon (sigmoidoscopy or colonoscopy). This checks for the earliest forms of colorectal cancer. Prostate and Testicular Cancer  Depending on your age and overall health, your health care provider may do certain tests to screen for prostate and testicular cancer.  Talk to your health care provider about any symptoms or concerns you have about testicular or prostate cancer. Skin Cancer  Check your skin from head to toe regularly.  Tell your health care provider about any new moles or changes in moles, especially if: ? There is a change in a mole's size, shape, or color. ? You have a mole that is larger than a pencil eraser.  Always use sunscreen.  Apply sunscreen liberally and repeat throughout the day.  Protect yourself by wearing long sleeves, pants, a wide-brimmed hat, and sunglasses when outside. What should I know about heart disease, diabetes, and high blood pressure?  If you are 69-80 years of age, have your blood pressure checked every 3-5 years. If you are 22 years of age or older, have your blood pressure checked every year. You should have your blood pressure measured twice-once when  you are at a hospital or clinic, and once when you are not at a hospital or clinic. Record the average of the two measurements. To check your blood pressure when you are not at a hospital or clinic, you can use: ? An automated blood pressure machine at a pharmacy. ? A home blood pressure monitor.  Talk to your health care provider about your target blood pressure.  If you are between 35-64 years old, ask your health care provider if you should take aspirin to prevent heart disease.  Have regular diabetes screenings by checking your fasting blood sugar level. ? If you are at a normal weight and have a low risk for diabetes, have this test once every three years after the age of 25. ? If you are overweight and have a high risk for diabetes, consider being tested at a younger age or more often.  A one-time screening for abdominal aortic aneurysm (AAA) by ultrasound is recommended for men aged 42-75 years who are current or former smokers. What should I know about preventing infection? Hepatitis B If you have a higher risk for hepatitis B, you should be screened for this virus. Talk with your health care provider to find out if you are at risk for hepatitis B infection. Hepatitis C Blood testing is recommended for:  Everyone born from [birth year] through 1967.  Anyone with known risk factors for hepatitis C. Sexually Transmitted Diseases (STDs)  You should be screened each year for STDs including gonorrhea and chlamydia if: ? You are sexually active and are younger than 64 years of age. ? You are older than 64 years of age and your health care provider tells you that you are at risk for this type of infection. ? Your sexual activity has changed since you were last screened and you are at an increased risk for chlamydia or gonorrhea. Ask your health care provider if you are at risk.  Talk with your health care provider about whether you are at high risk of being infected with HIV. Your health care  provider may recommend a prescription medicine to help prevent HIV infection. What else can I do?  Schedule regular health, dental, and eye exams.  Stay current with your vaccines (immunizations).  Do not use any tobacco products, such as cigarettes, chewing tobacco, and e-cigarettes. If you need help quitting, ask your health care provider.  Limit alcohol intake to no more than 2 drinks per day. One drink equals 12 ounces of beer, 5 ounces of wine, or 1 ounces of hard liquor.  Do not use street drugs.  Do not share needles.  Ask your health care provider for help if you need support or information about quitting drugs.  Tell your health care provider if you often feel depressed.  Tell your health care provider if you have ever been abused or do not feel safe at home. This information is not intended to replace advice given to you by your health care provider. Make sure you discuss any questions you have with your health  care provider. Document Released: 08/26/2007 Document Revised: 10/27/2015 Document Reviewed: 12/01/2014 Elsevier Interactive Patient Education  2019 [employer].

## 2018-03-12 NOTE — Progress Notes (Signed)
BP 110/68   Pulse 79   Temp (!) 97.3 F (36.3 C) (Oral)   Ht 5' 8.5" (1.74 m)   Wt 193 lb 4.8 oz (87.7 kg)   SpO2 97%   BMI 28.96 kg/m    Subjective:    Patient ID: Eric Rose, male    DOB: [birth year], 62 y.o.   MRN: [id number]  HPI: Eric Rose is a 62 y.o. male  Chief Complaint  Patient presents with  . Follow-up    has questions about TDap, and tamolosin med  . Medication Refill    HPI Here for a physical today Prostate is managed by urologist  USPSTF grade A and B recommendations Depression:  Depression screen [facility] 2/9 03/12/2018 02/21/2017 02/07/2016 08/06/2015 12/15/2014  Decreased Interest 0 0 0 0 0  Down, Depressed, Hopeless 0 0 0 1 0  PHQ - 2 Score 0 0 0 1 0  Altered sleeping 0 - - - -  Tired, decreased energy 0 - - - -  Change in appetite 0 - - - -  Feeling bad or failure about yourself  0 - - - -  Trouble concentrating 0 - - - -  Moving slowly or fidgety/restless 0 - - - -  Suicidal thoughts 0 - - - -  PHQ-9 Score 0 - - - -  Difficult doing work/chores Not difficult at all - - - -   Hypertension: BP Readings from Last 3 Encounters:  03/12/18 110/68  02/19/18 134/78  11/15/17 138/84   Obesity: Wt Readings from Last 3 Encounters:  03/12/18 193 lb 4.8 oz (87.7 kg)  02/19/18 195 lb 11.2 oz (88.8 kg)  11/15/17 196 lb 12.8 oz (89.3 kg)   BMI Readings from Last 3 Encounters:  03/12/18 28.96 kg/m  02/19/18 28.90 kg/m  11/15/17 29.06 kg/m    Immunizations: flu shots Skin cancer: nothing worrisome Lung cancer:  Never smoker Prostate cancer: managed by urologist, Zara Council No results found for: PSA Colorectal cancer: no fam hx; last UTD AAA: n/a Aspirin: taking 81 mg daily The 10-year ASCVD risk score Mikey Bussing DC Jr., et al., 2013) is: 10.5%   Values used to calculate the score:     Age: 45 years     Sex: Male     Is Non-Hispanic African American: Yes     Diabetic: No     Tobacco smoker: No     Systolic Blood Pressure: 510  mmHg     Is BP treated: Yes     HDL Cholesterol: 54 mg/dL     Total Cholesterol: 141 mg/dL  Diet: not too much fast food; not having a lot of fast food, more plate food w/veggies Exercise: tries to exercise every day; has a treadmill at home; rowing machine; weights too; wife joined the ladies gym and he goes to walk the park on pretty days Alcohol:    Office Visit from 03/12/2018 in [facility]  AUDIT-C Score  0     Tobacco use: never smoker HIV, hep B, hep C: negative 2017 STD testing and prevention (chl/gon/syphilis): not requested Glucose:  Glucose  Date Value Ref Range Status  12/18/2013 94 65 - 99 mg/dL Final  12/01/2013 89 65 - 99 mg/dL Final   Glucose, Bld  Date Value Ref Range Status  03/12/2018 85 65 - 99 mg/dL Final    Comment:    .            Fasting reference interval .  02/21/2017 97 65 - 99 mg/dL Final    Comment:    .            Fasting reference interval .   02/07/2016 81 65 - 99 mg/dL Final   Lipids:  Lab Results  Component Value Date   CHOL 141 03/12/2018   CHOL 171 02/21/2017   CHOL 164 02/07/2016   Lab Results  Component Value Date   HDL 54 03/12/2018   HDL 53 02/21/2017   HDL 55 02/07/2016   Lab Results  Component Value Date   LDLCALC 74 03/12/2018   LDLCALC 105 (H) 02/21/2017   LDLCALC 101 (H) 02/07/2016   Lab Results  Component Value Date   TRIG 45 03/12/2018   TRIG 52 02/21/2017   TRIG 41 02/07/2016   Lab Results  Component Value Date   CHOLHDL 2.6 03/12/2018   CHOLHDL 3.2 02/21/2017   CHOLHDL 3.0 02/07/2016   No results found for: LDLDIRECT   Depression screen [facility] 2/9 03/12/2018 02/21/2017 02/07/2016 08/06/2015 12/15/2014  Decreased Interest 0 0 0 0 0  Down, Depressed, Hopeless 0 0 0 1 0  PHQ - 2 Score 0 0 0 1 0  Altered sleeping 0 - - - -  Tired, decreased energy 0 - - - -  Change in appetite 0 - - - -  Feeling bad or failure about yourself  0 - - - -  Trouble concentrating 0 - - - -  Moving  slowly or fidgety/restless 0 - - - -  Suicidal thoughts 0 - - - -  PHQ-9 Score 0 - - - -  Difficult doing work/chores Not difficult at all - - - -   Fall Risk  03/12/2018 02/21/2017 02/07/2016 08/06/2015 12/15/2014  Falls in the past year? 0 No No No No  Number falls in past yr: 0 - - - -  Injury with Fall? 0 - - - -    Relevant past medical, surgical, family and social history reviewed Past Medical History:  Diagnosis Date  . Allergy   . Anxiety   . BPH (benign prostatic hypertrophy) with urinary obstruction    followed by urologist  . Decreased libido   . Dementia ([place])   . Erectile dysfunction 08/08/2015  . GERD (gastroesophageal reflux disease)   . History of pneumonia   . Hypertension   . Lumbago   . 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 JOINT REPLACEMENT Right    hip  . 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 Diabetes Mother   . Heart disease Mother   . Alzheimer's disease Mother   . Prostate cancer Brother   . Prostate cancer Paternal Uncle   . Bladder Cancer Neg Hx   . Kidney cancer Neg Hx    Social History   Tobacco Use  . Smoking status: Never Smoker  . Smokeless tobacco: Never Used  Substance Use Topics  . Alcohol use: No    Alcohol/week: 0.0 standard drinks  . Drug use: No     Office Visit from 03/12/2018 in [facility]  AUDIT-C Score  0      Interim medical history since last visit reviewed. Allergies and medications  reviewed  Review of Systems  Constitutional: Negative for unexpected weight change.  HENT: Negative for hearing loss.   Eyes:       Eye damage years ago, left eye; seeing eye doctor  Respiratory: Negative for wheezing.     Cardiovascular: Negative for chest pain and leg swelling.  Gastrointestinal: Negative for constipation.  Endocrine: Negative for polydipsia.  Genitourinary: Negative for hematuria.  Skin:       No worrisome moles  Hematological: Positive for adenopathy (check left side of neck).  Psychiatric/Behavioral:       Seeing [name], not getting worse, has his moments   Per HPI unless specifically indicated above     Objective:    BP 110/68   Pulse 79   Temp (!) 97.3 F (36.3 C) (Oral)   Ht 5' 8.5" (1.74 m)   Wt 193 lb 4.8 oz (87.7 kg)   SpO2 97%   BMI 28.96 kg/m   Wt Readings from Last 3 Encounters:  03/12/18 193 lb 4.8 oz (87.7 kg)  02/19/18 195 lb 11.2 oz (88.8 kg)  11/15/17 196 lb 12.8 oz (89.3 kg)    Physical Exam Constitutional:      General: He is not in acute distress.    Appearance: He is well-developed. He is not diaphoretic.  HENT:     Head: Normocephalic and atraumatic.     Nose: Nose normal.  Eyes:     General: No scleral icterus. Neck:     Thyroid: No thyromegaly.     Vascular: No JVD.  Cardiovascular:     Rate and Rhythm: Normal rate and regular rhythm.     Heart sounds: Normal heart sounds.  Pulmonary:     Effort: Pulmonary effort is normal. No respiratory distress.     Breath sounds: Normal breath sounds. No wheezing or rales.  Abdominal:     General: Bowel sounds are normal. There is no distension.     Palpations: Abdomen is soft.     Tenderness: There is no abdominal tenderness. There is no guarding.  Musculoskeletal: Normal range of motion.  Lymphadenopathy:     Cervical: Cervical adenopathy present.     Comments: Soft, mobile mass on the LEFT side of the neck a few cm below the mandible  Skin:    General: Skin is warm and dry.     Coloration: Skin is not pale.     Findings: No erythema or rash.  Neurological:     Mental Status: He is alert.     Motor: No abnormal muscle tone.     Coordination: Coordination normal.     Deep Tendon Reflexes:  Reflexes normal.  Psychiatric:        Behavior: Behavior normal.        Thought Content: Thought content normal.        Judgment: Judgment normal.       Assessment & Plan:   Problem List Items Addressed This Visit      Other   Preventative health care - Primary    USPSTF grade A and B recommendations reviewed with patient; age-appropriate recommendations, preventive care, screening tests, etc discussed and encouraged; healthy living encouraged; see AVS for patient education given to patient       Relevant Orders   CBC with Differential/Platelet (Completed)   COMPLETE METABOLIC PANEL WITH GFR (Completed)   Lipid panel (Completed)   TSH (Completed)    Other Visit Diagnoses    Need for tetanus, diphtheria, and acellular pertussis (Tdap)  vaccine       Relevant Orders   Tdap vaccine greater than or equal to 7yo IM (Completed)       Follow up plan: Return in about 1 year (around 03/13/2019) for CPE.  An after-visit summary was printed and given to the patient at [place].  Please see the patient instructions which may contain other information and recommendations beyond what is mentioned above in the assessment and plan.  No orders of the defined types were placed in this encounter.   Orders Placed This Encounter  Procedures  . Tdap vaccine greater than or equal to 7yo IM  . CBC with Differential/Platelet  . COMPLETE METABOLIC PANEL WITH GFR  . Lipid panel  . TSH

## 2018-03-14 ENCOUNTER — Other Ambulatory Visit: Payer: Self-pay | Admitting: Family Medicine

## 2018-03-14 MED ORDER — PRAVASTATIN SODIUM 20 MG PO TABS: 20.0000 mg | ORAL_TABLET | Freq: Every day | ORAL | 5 refills | Status: DC

## 2018-03-14 NOTE — Assessment & Plan Note (Signed)
USPSTF grade A and B recommendations reviewed with patient; age-appropriate recommendations, preventive care, screening tests, etc discussed and encouraged; healthy living encouraged; see AVS for patient education given to patient  

## 2018-03-18 ENCOUNTER — Ambulatory Visit: Payer: Managed Care, Other (non HMO) | Attending: Nurse Practitioner

## 2018-03-28 ENCOUNTER — Encounter: Payer: Managed Care, Other (non HMO) | Admitting: Family Medicine

## 2018-04-04 ENCOUNTER — Ambulatory Visit (INDEPENDENT_AMBULATORY_CARE_PROVIDER_SITE_OTHER): Payer: Managed Care, Other (non HMO) | Admitting: Family Medicine

## 2018-04-04 ENCOUNTER — Encounter: Payer: Self-pay | Admitting: Family Medicine

## 2018-04-04 VITALS — BP 118/78 | HR 90 | Temp 97.9°F | Resp 12 | Ht 68.5 in | Wt 189.8 lb

## 2018-04-04 DIAGNOSIS — R221 Localized swelling, mass and lump, neck: Secondary | ICD-10-CM | POA: Diagnosis not present

## 2018-04-04 DIAGNOSIS — E785 Hyperlipidemia, unspecified: Secondary | ICD-10-CM | POA: Insufficient documentation

## 2018-04-04 DIAGNOSIS — R413 Other amnesia: Secondary | ICD-10-CM

## 2018-04-04 DIAGNOSIS — R972 Elevated prostate specific antigen [PSA]: Secondary | ICD-10-CM | POA: Diagnosis not present

## 2018-04-04 DIAGNOSIS — E782 Mixed hyperlipidemia: Secondary | ICD-10-CM | POA: Insufficient documentation

## 2018-04-04 DIAGNOSIS — G4733 Obstructive sleep apnea (adult) (pediatric): Secondary | ICD-10-CM

## 2018-04-04 DIAGNOSIS — N138 Other obstructive and reflux uropathy: Secondary | ICD-10-CM

## 2018-04-04 DIAGNOSIS — I10 Essential (primary) hypertension: Secondary | ICD-10-CM

## 2018-04-04 DIAGNOSIS — N401 Enlarged prostate with lower urinary tract symptoms: Secondary | ICD-10-CM | POA: Diagnosis not present

## 2018-04-04 NOTE — Progress Notes (Signed)
BP 118/78   Pulse 90   Temp 97.9 F (36.6 C) (Oral)   Resp 12   Ht 5' 8.5" (1.74 m)   Wt 189 lb 12.8 oz (86.1 kg)   SpO2 99%   BMI 28.44 kg/m    Subjective:    Patient ID: Eric Rose, male    DOB: 01-07-1956, 63 y.o.   MRN: [id number]  HPI: Eric Rose is a 63 y.o. male  Chief Complaint  Patient presents with  . Annual Exam    HPI Patient just had a physical on Dec 31st  Here for f/u we believe Has a swollen place on the left side of the neck; I asked how long it's been there; he thinks it's been there since Thanksgiving; it was hard but now a little softer; he thinks it's a little smaller He thinks it might have been from dental work; he had a tooth pulled on the left side; not sure if related; he thinks the tooth was infected; had to take antibiotics for that He has been gargling with listerine  BP is controlled  Seeing [name] for memory; maternal "grandmother had it bad", "had full-blown dementia at 61"; mother never had any problems into her late 5s; sister and brother have of touch of memory problems  Elevated PSA, 4.9 back in 2017 and keeps going down, reviewed numbers with patient; seeing urologist for prostate issues; making good urine  Hx of anemia; no longer anemic by last CBC  High cholesterol; on statin; LDL chol dropped 31 points; used to eat fried foods, but not eating them like before; not much cheeses  OSA; has CPAP and that is managed by neurologist; trying to use every single night for at least 4 hours; does run in the family, brother and father  Depression screen [facility] 2/9 04/04/2018 03/12/2018 02/21/2017 02/07/2016 08/06/2015  Decreased Interest 0 0 0 0 0  Down, Depressed, Hopeless 0 0 0 0 1  PHQ - 2 Score 0 0 0 0 1  Altered sleeping 0 0 - - -  Tired, decreased energy 0 0 - - -  Change in appetite 0 0 - - -  Feeling bad or failure about yourself  0 0 - - -  Trouble concentrating 0 0 - - -  Moving slowly or fidgety/restless 0 0 - -  -  Suicidal thoughts 0 0 - - -  PHQ-9 Score 0 0 - - -  Difficult doing work/chores Not difficult at all Not difficult at all - - -   Fall Risk  04/04/2018 03/12/2018 02/21/2017 02/07/2016 08/06/2015  Falls in the past year? 0 0 No No No  Number falls in past yr: 0 0 - - -  Injury with Fall? 0 0 - - -    Relevant past medical, surgical, family and social history reviewed Past Medical History:  Diagnosis Date  . Allergy   . Anxiety   . BPH (benign prostatic hypertrophy) with urinary obstruction    followed by urologist  . Decreased libido   . Dementia ([place])   . Erectile dysfunction 08/08/2015  . GERD (gastroesophageal reflux disease)   . History of pneumonia   . Hypertension   . Lumbago   . 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 JOINT REPLACEMENT Right    hip  . 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 Diabetes Mother   . Heart disease Mother   . Alzheimer's disease Mother   . Prostate cancer Brother   . Prostate cancer Paternal Uncle   . Bladder Cancer Neg Hx   . Kidney cancer Neg Hx    Social History   Tobacco Use  . Smoking status: Never Smoker  . Smokeless tobacco: Never Used  Substance Use Topics  . Alcohol use: No    Alcohol/week: 0.0 standard drinks  . Drug use: No     Office Visit from 04/04/2018 in [facility]  AUDIT-C Score  0      Interim medical history since last visit reviewed. Allergies and medications reviewed  Review of Systems  Constitutional: Negative for chills, diaphoresis, fatigue, fever and unexpected weight change (working out and trying to eat better).       No night sweats  HENT: Positive for rhinorrhea. Negative for ear  discharge, ear pain, sinus pressure and sinus pain.   Hematological: Positive for adenopathy.   Per HPI unless specifically indicated above     Objective:    BP 118/78   Pulse 90   Temp 97.9 F (36.6 C) (Oral)   Resp 12   Ht 5' 8.5" (1.74 m)   Wt 189 lb 12.8 oz (86.1 kg)   SpO2 99%   BMI 28.44 kg/m   Wt Readings from Last 3 Encounters:  04/04/18 189 lb 12.8 oz (86.1 kg)  03/12/18 193 lb 4.8 oz (87.7 kg)  02/19/18 195 lb 11.2 oz (88.8 kg)    Physical Exam Constitutional:      General: He is not in acute distress.    Appearance: He is well-developed.  HENT:     Head: Normocephalic and atraumatic.     Mouth/Throat:     Mouth: Mucous membranes are moist. No oral lesions.     Pharynx: No oropharyngeal exudate or posterior oropharyngeal erythema.     Comments: Site of dental extraction healed well, no erythema or drainage Eyes:     General: No scleral icterus. Neck:     Thyroid: No thyromegaly.   Cardiovascular:     Rate and Rhythm: Normal rate and regular rhythm.  Pulmonary:     Effort: Pulmonary effort is normal.     Breath sounds: Normal breath sounds.  Abdominal:     General: Bowel sounds are normal. There is no distension.     Palpations: Abdomen is soft.  Lymphadenopathy:     Cervical: Cervical adenopathy present.  Skin:    General: Skin is warm and dry.     Coloration: Skin is not pale.  Neurological:     Mental Status: He is alert.     Coordination: Coordination normal.  Psychiatric:        Behavior: Behavior normal.        Thought Content: Thought content normal.        Judgment: Judgment normal.     Results for orders placed or performed in visit on 03/12/18  CBC with Differential/Platelet  Result Value Ref Range   WBC 7.9 3.8 - 10.8 Thousand/uL   RBC 4.41 4.20 - 5.80 Million/uL   Hemoglobin 13.8 13.2 - 17.1 g/dL   HCT 40.6 38.5 - 50.0 %   MCV 92.1 80.0 - 100.0 fL   MCH 31.3 27.0 - 33.0  pg   MCHC 34.0 32.0 - 36.0 g/dL   RDW 12.0 11.0 - 15.0 %    Platelets 240 140 - 400 Thousand/uL   MPV 11.1 7.5 - 12.5 fL   Neutro Abs 4,740 1,500 - 7,800 cells/uL   Lymphs Abs 2,030 850 - 3,900 cells/uL   Absolute Monocytes 687 200 - 950 cells/uL   Eosinophils Absolute 411 15 - 500 cells/uL   Basophils Absolute 32 0 - 200 cells/uL   Neutrophils Relative % 60 %   Total Lymphocyte 25.7 %   Monocytes Relative 8.7 %   Eosinophils Relative 5.2 %   Basophils Relative 0.4 %  COMPLETE METABOLIC PANEL WITH GFR  Result Value Ref Range   Glucose, Bld 85 65 - 99 mg/dL   BUN 12 7 - 25 mg/dL   Creat 0.93 0.70 - 1.25 mg/dL   GFR, Est Non African American 86 > OR = 60 mL/min/1.53m2   GFR, Est African American 100 > OR = 60 mL/min/1.28m2   BUN/Creatinine Ratio NOT APPLICABLE 6 - 22 (calc)   Sodium 141 135 - 146 mmol/L   Potassium 4.0 3.5 - 5.3 mmol/L   Chloride 103 98 - 110 mmol/L   CO2 29 20 - 32 mmol/L   Calcium 9.1 8.6 - 10.3 mg/dL   Total Protein 6.4 6.1 - 8.1 g/dL   Albumin 4.1 3.6 - 5.1 g/dL   Globulin 2.3 1.9 - 3.7 g/dL (calc)   AG Ratio 1.8 1.0 - 2.5 (calc)   Total Bilirubin 0.3 0.2 - 1.2 mg/dL   Alkaline phosphatase (APISO) 68 40 - 115 U/L   AST 23 10 - 35 U/L   ALT 21 9 - 46 U/L  Lipid panel  Result Value Ref Range   Cholesterol 141 <200 mg/dL   HDL 54 >40 mg/dL   Triglycerides 45 <150 mg/dL   LDL Cholesterol (Calc) 74 mg/dL (calc)   Total CHOL/HDL Ratio 2.6 <5.0 (calc)   Non-HDL Cholesterol (Calc) 87 <130 mg/dL (calc)  TSH  Result Value Ref Range   TSH 2.09 0.40 - 4.50 mIU/L      Assessment & Plan:   Problem List Items Addressed This Visit      Cardiovascular and Mediastinum   Essential hypertension, benign (Chronic)    BP is well controlled today        Respiratory   Obstructive sleep apnea (Chronic)    Managed by neurologist; using CPAP every night        Genitourinary   BPH with obstruction/lower urinary tract symptoms    Managed by urologist        Other   Memory impairment    Seeing [name],  neurologist; he thinks the medicine is helping      Hyperlipidemia (Chronic)    Reviewed last lipids with him; continue statin; limit saturated fats      Elevated PSA    Managed by urologist       Other Visit Diagnoses    Nodule of neck    -  Primary   Relevant Orders   US Soft Tissue Head/Neck       Follow up plan: Return in about 6 months (around 10/03/2018) for follow-up visit with Dr. Sanda Klein.  An after-visit summary was printed and given to the patient at [place].  Please see the patient instructions which may contain other information and recommendations beyond what is mentioned above in the assessment and plan.  No orders of the defined types were placed in this  encounter.   Orders Placed This Encounter  Procedures  . US Soft Tissue Head/Neck

## 2018-04-04 NOTE — Assessment & Plan Note (Signed)
Managed by urologist 

## 2018-04-04 NOTE — Assessment & Plan Note (Signed)
Reviewed last lipids with him; continue statin; limit saturated fats

## 2018-04-04 NOTE — Assessment & Plan Note (Addendum)
BP is well controlled today

## 2018-04-04 NOTE — Assessment & Plan Note (Signed)
Managed by neurologist; using CPAP every night

## 2018-04-04 NOTE — Assessment & Plan Note (Signed)
Seeing [name], neurologist; he thinks the medicine is helping

## 2018-04-04 NOTE — Assessment & Plan Note (Signed)
Patient has not had the ultrasound done yet; asking staff to get that scheduled now

## 2018-04-04 NOTE — Patient Instructions (Signed)
We'll have you get the scan of your neck  Try to limit saturated fats in your diet (bologna, hot dogs, barbeque, cheeseburgers, hamburgers, steak, bacon, sausage, cheese, etc.) and get more fresh fruits, vegetables, and whole grains  Return in 6 months

## 2018-04-08 ENCOUNTER — Ambulatory Visit
Admission: RE | Admit: 2018-04-08 | Discharge: 2018-04-08 | Disposition: A | Discharge status: Home / Self Care | Payer: Managed Care, Other (non HMO) | Source: Ambulatory Visit | Attending: Family Medicine | Admitting: Family Medicine

## 2018-04-08 DIAGNOSIS — R221 Localized swelling, mass and lump, neck: Secondary | ICD-10-CM | POA: Diagnosis present

## 2018-04-08 NOTE — Progress Notes (Signed)
Eric Rose, please let the patient know that the swelling in his neck appears to be an inflamed lymph node; this may have been due to his dental issue; however, it does not appear to be going down and Eric Rose saw him on December 10th and it had been there a few weeks before that even; I think since it's been enlarged for two months, he needs to see an [place] specialist; please REFER to ENT for enlarged lymph node (LEFT submandibular) in the neck

## 2018-04-09 ENCOUNTER — Telehealth: Payer: Self-pay

## 2018-04-09 ENCOUNTER — Other Ambulatory Visit: Payer: Self-pay

## 2018-04-09 DIAGNOSIS — R599 Enlarged lymph nodes, unspecified: Secondary | ICD-10-CM

## 2018-04-09 NOTE — Telephone Encounter (Signed)
Erroneous entry

## 2018-04-11 ENCOUNTER — Other Ambulatory Visit: Payer: Self-pay | Admitting: Family Medicine

## 2018-04-12 NOTE — Telephone Encounter (Signed)
Lab Results  Component Value Date   CREATININE 0.93 03/12/2018   Lab Results  Component Value Date   K 4.0 03/12/2018

## 2018-04-30 ENCOUNTER — Other Ambulatory Visit: Payer: Self-pay | Admitting: Otolaryngology

## 2018-04-30 DIAGNOSIS — R221 Localized swelling, mass and lump, neck: Secondary | ICD-10-CM

## 2018-05-01 ENCOUNTER — Other Ambulatory Visit: Payer: Self-pay | Admitting: Otolaryngology

## 2018-05-01 DIAGNOSIS — R591 Generalized enlarged lymph nodes: Secondary | ICD-10-CM

## 2018-05-01 DIAGNOSIS — R221 Localized swelling, mass and lump, neck: Secondary | ICD-10-CM

## 2018-05-02 ENCOUNTER — Other Ambulatory Visit: Payer: Self-pay | Admitting: Radiology

## 2018-05-03 ENCOUNTER — Other Ambulatory Visit: Payer: Self-pay | Admitting: Student

## 2018-05-06 ENCOUNTER — Ambulatory Visit
Admission: RE | Admit: 2018-05-06 | Discharge: 2018-05-06 | Disposition: A | Discharge status: Home / Self Care | Payer: Managed Care, Other (non HMO) | Source: Ambulatory Visit | Attending: Otolaryngology | Admitting: Otolaryngology

## 2018-05-06 DIAGNOSIS — R591 Generalized enlarged lymph nodes: Secondary | ICD-10-CM | POA: Diagnosis not present

## 2018-05-06 DIAGNOSIS — R221 Localized swelling, mass and lump, neck: Secondary | ICD-10-CM | POA: Diagnosis present

## 2018-05-06 DIAGNOSIS — Z8249 Family history of ischemic heart disease and other diseases of the circulatory system: Secondary | ICD-10-CM | POA: Diagnosis not present

## 2018-05-06 DIAGNOSIS — N401 Enlarged prostate with lower urinary tract symptoms: Secondary | ICD-10-CM | POA: Diagnosis not present

## 2018-05-06 DIAGNOSIS — I10 Essential (primary) hypertension: Secondary | ICD-10-CM | POA: Diagnosis not present

## 2018-05-06 DIAGNOSIS — Z79899 Other long term (current) drug therapy: Secondary | ICD-10-CM | POA: Diagnosis not present

## 2018-05-06 DIAGNOSIS — F0390 Unspecified dementia without behavioral disturbance: Secondary | ICD-10-CM | POA: Insufficient documentation

## 2018-05-06 DIAGNOSIS — N138 Other obstructive and reflux uropathy: Secondary | ICD-10-CM | POA: Insufficient documentation

## 2018-05-06 DIAGNOSIS — K219 Gastro-esophageal reflux disease without esophagitis: Secondary | ICD-10-CM | POA: Diagnosis not present

## 2018-05-06 DIAGNOSIS — Z7982 Long term (current) use of aspirin: Secondary | ICD-10-CM | POA: Insufficient documentation

## 2018-05-06 MED ORDER — SODIUM CHLORIDE 0.9 % IV SOLN: INTRAVENOUS | Status: DC

## 2018-05-06 MED ORDER — HYDROCODONE-ACETAMINOPHEN 5-325 MG PO TABS: 1.0000 | ORAL_TABLET | ORAL | Status: DC | PRN

## 2018-05-06 MED ORDER — FENTANYL CITRATE (PF) 100 MCG/2ML IJ SOLN
INTRAMUSCULAR | Status: AC | PRN
  Administered 2018-05-06: 50 ug via INTRAVENOUS
  Administered 2018-05-06: 25 ug via INTRAVENOUS

## 2018-05-06 MED ORDER — SODIUM CHLORIDE 0.9 % IV SOLN
INTRAVENOUS | Status: AC | PRN
  Administered 2018-05-06: 10 mL/h via INTRAVENOUS

## 2018-05-06 MED ORDER — MIDAZOLAM HCL 5 MG/5ML IJ SOLN
INTRAMUSCULAR | Status: AC
  Filled 2018-05-06: qty 5

## 2018-05-06 MED ORDER — MIDAZOLAM HCL 5 MG/5ML IJ SOLN
INTRAMUSCULAR | Status: AC | PRN
  Administered 2018-05-06: 0.5 mg via INTRAVENOUS
  Administered 2018-05-06: 1 mg via INTRAVENOUS

## 2018-05-06 MED ORDER — FENTANYL CITRATE (PF) 100 MCG/2ML IJ SOLN
INTRAMUSCULAR | Status: AC
  Filled 2018-05-06: qty 4

## 2018-05-06 NOTE — H&P (Signed)
Chief Complaint: Left neck mass  Referring Physician(s): Bennett,Paul  Supervising Physician: Arne Cleveland  Patient Status: [facility] - Out-pt  History of Present Illness: Eric Rose is a 63 y.o. male with a left neck mass.  It is has been present about 5 months and is getting larger.  US showed a 3 x 1.2 x 1.5 cm lymph node.  No nausea/vomiting. No Fever/chills. ROS negative.  He is here today for biopsy.  He is NPO. No blood thinners.   Past Medical History:  Diagnosis Date  . Allergy   . Anxiety   . BPH (benign prostatic hypertrophy) with urinary obstruction    followed by urologist  . Decreased libido   . Dementia ([place])   . Erectile dysfunction 08/08/2015  . GERD (gastroesophageal reflux disease)   . History of pneumonia   . Hypertension   . Lumbago   . 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 JOINT REPLACEMENT Right    hip  . 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  Medications: Prior to Admission medications   Medication Sig Start Date End Date Taking? Authorizing Provider  aspirin EC 81 MG tablet Take 1 tablet (81 mg total) by mouth daily. 02/21/17   Lada, Satira Anis, MD  donepezil (ARICEPT) 5 MG tablet Take 5 mg by mouth at bedtime. 02/02/17   [provider]  Dutasteride-Tamsulosin HCl 0.5-0.4 MG CAPS Take 1 tablet by mouth daily. Patient not taking: Reported on 04/04/2018 02/25/18   Zara Council A, PA-C  Fish Oil OIL Take 1,000 mg by mouth daily.     [provider]  Glucosamine-Chondroit-Vit C-Mn (GLUCOSAMINE 1500 COMPLEX PO) Take by mouth.    [provider]  lisinopril (PRINIVIL,ZESTRIL) 20 MG tablet TAKE 1 TABLET BY MOUTH  ONCE DAILY 04/12/18   Lada, Satira Anis, MD  Multiple Vitamin (MULTIVITAMIN) capsule Take 1 capsule by mouth daily.    [provider]  pravastatin (PRAVACHOL) 20 MG tablet Take 1 tablet (20 mg total) by mouth at bedtime. 03/14/18   Lada, Satira Anis, MD  tadalafil (CIALIS) 5 MG tablet TAKE 1 TABLET BY MOUTH EVERY DAY FOR ERECTILE DYSFUNCTION. 08/05/17   Arnetha Courser, MD  VITAMIN D, ERGOCALCIFEROL, PO Take 1,000 mg by mouth daily.     [provider]     Family History  Problem Relation Age of Onset  . Diabetes Mother   . Heart disease Mother   . Alzheimer's disease Mother   . Prostate cancer Brother   . Prostate cancer Paternal Uncle   . Bladder Cancer Neg Hx   . Kidney cancer Neg Hx     Social History   Socioeconomic History  . Marital status: Married    Spouse name: [name]  . Number of children: 3  . Years of education: 85  . Highest education level: Some college, no degree  Occupational History  . Not on file  Social Needs  . Financial resource strain: Not hard at all  . Food insecurity:    Worry: Never true    Inability: Never true  . Transportation needs:    Medical: No    Non-medical: No  Tobacco Use  . Smoking  status: Never Smoker  . Smokeless tobacco: Never Used  Substance and Sexual Activity  . Alcohol use: No    Alcohol/week: 0.0 standard drinks  . Drug use: No  . Sexual activity: Yes  Lifestyle  . Physical activity:    Days per week: 0 days    Minutes per session: 0 min  . Stress: Not at all  Relationships  . Social connections:    Talks on phone: Twice a week    Gets together: Twice a week    Attends religious service: More than 4 times per year    Active member of club or organization: No    Attends meetings of clubs or organizations: Never    Relationship status: Married  Other Topics Concern  . Not on file  Social History Narrative  . Not on file     Review of Systems: A 12 point ROS discussed and pertinent positives are  indicated in the HPI above.  All other systems are negative.  Review of Systems   Physical Exam Constitutional:      Appearance: Normal appearance.  HENT:     Head: Normocephalic and atraumatic.  Eyes:     Extraocular Movements: Extraocular movements intact.  Neck:     Musculoskeletal: Normal range of motion.     Comments: Palpable left neck mass Cardiovascular:     Rate and Rhythm: Normal rate and regular rhythm.  Pulmonary:     Effort: Pulmonary effort is normal.     Breath sounds: Normal breath sounds.  Musculoskeletal: Normal range of motion.  Skin:    General: Skin is warm and dry.  Neurological:     General: No focal deficit present.     Mental Status: He is alert and oriented to person, place, and time.  Psychiatric:        Mood and Affect: Mood normal.        Thought Content: Thought content normal.        Judgment: Judgment normal.     Imaging: US Soft Tissue Head/neck  Result Date: 04/08/2018 CLINICAL DATA:  63 y/o M; left-sided neck nodule, greater than 1 month. EXAM: ULTRASOUND OF HEAD/NECK SOFT TISSUES TECHNIQUE: Ultrasound examination of the head and neck soft tissues was performed in the area of clinical concern. COMPARISON:  11/04/2008 CT neck. FINDINGS: In the left submandibular space at the site of palpable abnormality there is an ovoid well-circumscribed smoothly marginated hypoechoic structure measuring 3.0 x 1.2 x 1.5 cm centered within the subcutaneous fat. On color and spectral Doppler there is hypervascularity with a central vascular pedicle. The structure has features typical of a lymph node and likely represents lymphadenopathy. No fluid collection or findings of necrosis. IMPRESSION: In the area of palpable interest there is a nonspecific enlarged hypervascular lymph node. Submandibular lymphadenopathy is typically related to dental, oral, or pharyngeal inflammation or neoplasm and direct visualization is recommended. Similar lymphadenopathy was present  in this region on the 2010 CT neck. Electronically Signed   By: Kristine Garbe M.D.   On: 04/08/2018 16:31    Labs:  CBC: Recent Labs    03/12/18 1544  WBC 7.9  HGB 13.8  HCT 40.6  PLT 240    COAGS: No results for input(s): INR, APTT in the last 8760 hours.  BMP: Recent Labs    03/12/18 1544  NA 141  K 4.0  CL 103  CO2 29  GLUCOSE 85  BUN 12  CALCIUM 9.1  CREATININE 0.93  GFRNONAA 86  GFRAA 100    LIVER FUNCTION TESTS: Recent Labs    03/12/18 1544  BILITOT 0.3  AST 23  ALT 21  PROT 6.4    TUMOR MARKERS: No results for input(s): AFPTM, CEA, CA199, CHROMGRNA in the last 8760 hours.  Assessment and Plan:  3 cm Left neck mass   Will evaluate with [place] first then proceed with image guided core biopsy today by Dr. Vernard Gambles.  Risks and benefits of neck mass biopsy was discussed with the patient and/or patient's family including, but not limited to bleeding, infection, damage to adjacent structures or low yield requiring additional tests.  All of the questions were answered and there is agreement to proceed.  Consent signed and in chart.  Thank you for this interesting consult.  I greatly enjoyed meeting DONNALD TABAR and look forward to participating in their care.  A copy of this report was sent to the requesting provider on this date.  Electronically Signed: Murrell Redden, PA-C   05/06/2018, 12:38 PM      I spent a total of  30 Minutes in face to face in clinical consultation, greater than 50% of which was counseling/coordinating care for neck mass/lymph node biopsy.

## 2018-05-06 NOTE — Discharge Instructions (Signed)
Needle Biopsy, Care After  This sheet gives you information about how to care for yourself after your procedure. Your health care provider may also give you more specific instructions. If you have problems or questions, contact your health care provider.  What can I expect after the procedure?  After the procedure, it is common to have soreness, bruising, or mild pain at the puncture site. This should go away in a few days.  Follow these instructions at home:  Needle insertion site care     Wash your hands with soap and water before you change your bandage (dressing). If you cannot use soap and water, use hand sanitizer.   Follow instructions from your health care provider about how to take care of your puncture site. This includes:  ? When and how to change your dressing.  ? When to remove your dressing.   Check your puncture site every day for signs of infection. Check for:  ? Redness, swelling, or pain.  ? Fluid or blood.  ? Pus or a bad smell.  ? Warmth.  General instructions   Return to your normal activities as told by your health care provider. Ask your health care provider what activities are safe for you.   Do not take baths, swim, or use a hot tub until your health care provider approves. Ask your health care provider if you may take showers. You may only be allowed to take sponge baths.   Take over-the-counter and prescription medicines only as told by your health care provider.   Keep all follow-up visits as told by your health care provider. This is important.  Contact a health care provider if:   You have a fever.   You have redness, swelling, or pain at the puncture site that lasts longer than a few days.   You have fluid, blood, or pus coming from your puncture site.   Your puncture site feels warm to the touch.  Get help right away if:   You have severe bleeding from the puncture site.  Summary   After the procedure, it is common to have soreness, bruising, or mild pain at the puncture  site. This should go away in a few days.   Check your puncture site every day for signs of infection, such as redness, swelling, or pain.   Get help right away if you have severe bleeding from your puncture site.  This information is not intended to replace advice given to you by your health care provider. Make sure you discuss any questions you have with your health care provider.  Document Released: 07/14/2014 Document Revised: 03/12/2017 Document Reviewed: 03/12/2017  Elsevier Interactive Patient Education  2019 [employer] Inc[employer]

## 2018-05-08 ENCOUNTER — Other Ambulatory Visit: Payer: Self-pay

## 2018-05-08 DIAGNOSIS — R972 Elevated prostate specific antigen [PSA]: Secondary | ICD-10-CM

## 2018-05-08 LAB — SURGICAL PATHOLOGY

## 2018-05-09 ENCOUNTER — Encounter: Payer: Self-pay | Admitting: Urology

## 2018-05-09 ENCOUNTER — Other Ambulatory Visit: Payer: Managed Care, Other (non HMO)

## 2018-05-11 LAB — AEROBIC/ANAEROBIC CULTURE W GRAM STAIN (SURGICAL/DEEP WOUND)
Culture: NO GROWTH
Special Requests: NORMAL

## 2018-05-11 LAB — AEROBIC/ANAEROBIC CULTURE (SURGICAL/DEEP WOUND)

## 2018-05-16 ENCOUNTER — Encounter: Payer: Self-pay | Admitting: Urology

## 2018-05-16 ENCOUNTER — Other Ambulatory Visit: Payer: Self-pay

## 2018-05-16 ENCOUNTER — Ambulatory Visit (INDEPENDENT_AMBULATORY_CARE_PROVIDER_SITE_OTHER): Payer: Managed Care, Other (non HMO) | Admitting: Urology

## 2018-05-16 VITALS — BP 120/81 | HR 78 | Resp 14 | Ht 68.5 in

## 2018-05-16 DIAGNOSIS — Z87898 Personal history of other specified conditions: Secondary | ICD-10-CM | POA: Diagnosis not present

## 2018-05-16 DIAGNOSIS — N138 Other obstructive and reflux uropathy: Secondary | ICD-10-CM | POA: Diagnosis not present

## 2018-05-16 DIAGNOSIS — R972 Elevated prostate specific antigen [PSA]: Secondary | ICD-10-CM

## 2018-05-16 DIAGNOSIS — N401 Enlarged prostate with lower urinary tract symptoms: Secondary | ICD-10-CM | POA: Diagnosis not present

## 2018-05-16 NOTE — Addendum Note (Signed)
Addended by: Tommy Rainwater on: 05/16/2018 03:45 PM   Modules accepted: Orders

## 2018-05-16 NOTE — Progress Notes (Signed)
05/16/2018  2:46 PM   [name] 10/03/1955 [id number]  Referring provider: Arnetha Courser, MD [address] [place], [place] [address]  Chief Complaint  Patient presents with  . Follow-up   HPI: Patient is a 63 year old African American male with a history of an elevated PSA and  BPH with LUTS presents today for 6 month follow up.      PSA Elevation  Component     Latest Ref Rng & Units 04/08/2015 07/29/2015 08/23/2015 10/03/2016  Prostate Specific Ag, Serum     0.0 - 4.0 ng/mL 2.5 3.5 4.9 (H) 2.6   Component     Latest Ref Rng & Units 03/29/2017 11/15/2017  Prostate Specific Ag, Serum     0.0 - 4.0 ng/mL 1.5 0.8    BPH WITH LUTS His IPSS score today is 9/2, which is moderate lower urinary tract symptomatology. He is satisfied with his urinary symptoms.   His previous was I PSS 11. His no major complaint today. He denies any dysuria, hematuria or suprapubic pain.  He was taking Jalyn but discontinued use and cannot recall when. He has noticed some difference after taking Jalyn. He does drink a lot of water. He does not have a family history of PCa.  IPSS    Row Name 05/16/18 1300         International Prostate Symptom Score   How often have you had the sensation of not emptying your bladder?  Less than 1 in 5     How often have you had to urinate less than every two hours?  Less than half the time     How often have you found you stopped and started again several times when you urinated?  Not at All     How often have you found it difficult to postpone urination?  Less than half the time     How often have you had a weak urinary stream?  Less than 1 in 5 times     How often have you had to strain to start urination?  Not at All     How many times did you typically get up at night to urinate?  3 Times     Total IPSS Score  9       Quality of Life due to urinary symptoms   If you were to spend the rest of your life with your urinary condition just the way it  is now how would you feel about that?  Mostly Satisfied       Score:  1-7 Mild 8-19 Moderate 20-35 Severe  PMH: Past Medical History:  Diagnosis Date  . Allergy   . Anxiety   . BPH (benign prostatic hypertrophy) with urinary obstruction    followed by urologist  . Decreased libido   . Dementia ([place])   . Erectile dysfunction 08/08/2015  . GERD (gastroesophageal reflux disease)   . History of pneumonia   . Hypertension   . Lumbago   . 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 JOINT REPLACEMENT Bilateral    hip  . 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 Always use your most recent med list.        aspirin EC 81 MG tablet Take 1 tablet (81 mg total) by mouth daily.   GLUCOSAMINE 1500 COMPLEX PO Take by mouth.   lisinopril 20 MG tablet Commonly known as:  PRINIVIL,ZESTRIL TAKE 1 TABLET BY MOUTH ONCE DAILY   multivitamin capsule Take 1 capsule by mouth daily.   pravastatin 20 MG tablet Commonly known as:  PRAVACHOL Take 1 tablet (20 mg total) by mouth at bedtime.   tadalafil 5 MG tablet Commonly known as:  CIALIS TAKE 1 TABLET BY MOUTH EVERY DAY FOR ERECTILE DYSFUNCTION.   VITAMIN D (ERGOCALCIFEROL) PO Take 1,000 mg by mouth daily.       Allergies: No Known Allergies  Family History: Family History  Problem Relation Age of Onset  . Diabetes Mother   . Heart disease Mother   . Alzheimer's disease Mother   . Prostate cancer Brother   . Prostate cancer Paternal Uncle   . Bladder Cancer Neg Hx   . Kidney cancer Neg Hx     Social History:  reports that he has  never smoked. He has never used smokeless tobacco. He reports that he does not drink alcohol or use drugs.  ROS: UROLOGY Frequent Urination?: No Hard to postpone urination?: No Burning/pain with urination?: No Get up at night to urinate?: No Leakage of urine?: No Urine stream starts and stops?: No Trouble starting stream?: No Do you have to strain to urinate?: No Blood in urine?: No Urinary tract infection?: No Sexually transmitted disease?: No Injury to kidneys or bladder?: No Painful intercourse?: No Weak stream?: No Erection problems?: No Penile pain?: No  Gastrointestinal Nausea?: No Vomiting?: No Indigestion/heartburn?: No Diarrhea?: No Constipation?: No  Constitutional Fever: No Night sweats?: No Weight loss?: No Fatigue?: No  Skin Skin rash/lesions?: No Itching?: No  Eyes Blurred vision?: No Double vision?: No  Ears/Nose/Throat Sore throat?: No Sinus problems?: No  Hematologic/Lymphatic Swollen glands?: Yes Easy bruising?: No  Cardiovascular Leg swelling?: No Chest pain?: No  Respiratory Cough?: No Shortness of breath?: No  Endocrine Excessive thirst?: No  Musculoskeletal Back pain?: No Joint pain?: Yes  Neurological Headaches?: No Dizziness?: No  Psychologic Depression?: No Anxiety?: No  Physical Exam: BP 120/81   Pulse 78   Resp 14   Ht 5' 8.5" (1.74 m)   BMI 28.77 kg/m   Constitutional:  Well nourished. Alert and oriented, No acute distress. HEENT: [place] AT, moist mucus membranes.  Trachea midline, no masses. Cardiovascular: No clubbing, cyanosis, or edema. Respiratory: Normal respiratory effort, no increased work of breathing.  GU: No CVA tenderness. No bladder fullness or masses. Patient with uncircumcised phallus. Foreskin easily retracted Urethral meatus is patent.  No penile discharge. No penile lesions or rashes. Scrotum without lesions, cysts, rashes and/or edema.  Testicles are located scrotally bilaterally. No masses  are appreciated in the testicles. Left and right epididymis are normal. Rectal: Patient with  normal sphincter tone. Anus and perineum without scarring or rashes. No rectal masses are appreciated. Prostate is approximately 50 grams, no hard nodules are appreciated. Could only palpate mid portion and apex.  Skin: No rashes, bruises or suspicious lesions. Neurologic: Grossly intact, no focal deficits, moving all 4 extremities. Psychiatric: Normal mood and affect.   Laboratory Data:  PSA History:  4.0 ng/mL on 06/17/2012-started on Jalyn  2.0 ng/mL on 07/22/2012  4.8 ng/mL on 12/29/2014-restarted on Jalyn  3.6 ng/mL on 01/12/2015  2.5 ng/mL on 04/08/2015  3.5 ng/mL on 07/29/2015  4.9 ng/mL on 08/23/2015  2.8 ng/mL on 10/03/2016  1.5 ng/mL on  03/29/2017   0.8 ng/mL on 11/15/2017 I have reviewed the labs  Assessment & Plan:    1. History of elevated PSA PSA drawn today; will call with results.     2. BPH with LUTS IPSS score is 9/2, it is slightly improved Continue conservative management, avoiding bladder irritants and timed voiding's Based on PSA, will consider restarting Jalyn; will call with results  RTC in 6 months for IPSS, PSA and exam - if PSA is stable  Return for pending PSA results .  Zara Council, PA-C  [place] [address] [place],  [address] [phone]  I, Lucas Mallow, am acting as a Education administrator for [employer],  I have reviewed the above documentation for accuracy and completeness, and I agree with the above.    Zara Council, PA-C

## 2018-05-17 ENCOUNTER — Telehealth: Payer: Self-pay

## 2018-05-17 DIAGNOSIS — R972 Elevated prostate specific antigen [PSA]: Secondary | ICD-10-CM

## 2018-05-17 LAB — PSA: Prostate Specific Ag, Serum: 1.5 ng/mL (ref 0.0–4.0)

## 2018-05-17 NOTE — Telephone Encounter (Signed)
Called pt no answer. LM for pt informing him of the information below. Lab ordered. Lab appt scheduled.

## 2018-05-17 NOTE — Telephone Encounter (Signed)
-----   Message from Nori Riis, PA-C sent at 05/17/2018  7:45 AM EST ----- Please let Mr. Gulley know that his PSA is stable at 1.5.  I would like it checked again in 6 months.  He does not need to restart the [place].

## 2018-05-18 ENCOUNTER — Other Ambulatory Visit: Payer: Self-pay | Admitting: Family Medicine

## 2018-05-18 NOTE — Telephone Encounter (Signed)
Patient saw urology two days ago I will respectfully see if she will take over the prescribing of this medicine (I believe cialis 5 mg daily is used for BPH)

## 2018-08-01 ENCOUNTER — Telehealth: Payer: Self-pay | Admitting: Family Medicine

## 2018-08-01 NOTE — Telephone Encounter (Signed)
He may discontinue his aspirin, recommend continuing his cholesterol and blood pressure medication.

## 2018-08-01 NOTE — Telephone Encounter (Signed)
Copied from [place] [phone]. Topic: Quick Communication - See Telephone Encounter >> Aug 01, 2018  1:05 PM Blase Mess A wrote: CRM for notification. See Telephone encounter for: 08/01/18. Patient is calling because he would like to come off of the 81 mg of aspirin Please advise Thank you CB- [phone] (H)

## 2018-08-02 NOTE — Telephone Encounter (Signed)
Left detailed voicemail

## 2018-08-02 NOTE — Telephone Encounter (Signed)
Pt states he just missed a call from [place].

## 2018-08-02 NOTE — Telephone Encounter (Signed)
Patient returning call from [place]. Call back is [phone]

## 2018-08-08 ENCOUNTER — Telehealth: Payer: Self-pay | Admitting: Family Medicine

## 2018-08-08 NOTE — Telephone Encounter (Signed)
Copied from [place] [phone]. Topic: Quick Communication - See Telephone Encounter >> Aug 08, 2018  4:18 PM Nils Flack wrote: CRM for notification. See Telephone encounter for: 08/08/18.pt would like to know why he was taken off of donezapril? Cb is [phone]

## 2018-08-09 NOTE — Telephone Encounter (Signed)
We have never prescribed this medication, he will need to call Dr. Trena Platt office to inquire about this.

## 2018-08-09 NOTE — Telephone Encounter (Signed)
Pt.notified

## 2018-09-26 ENCOUNTER — Other Ambulatory Visit: Payer: Self-pay | Admitting: Family Medicine

## 2018-10-01 ENCOUNTER — Other Ambulatory Visit: Payer: Self-pay | Admitting: Family Medicine

## 2018-10-02 ENCOUNTER — Other Ambulatory Visit: Payer: Self-pay

## 2018-10-02 ENCOUNTER — Encounter: Payer: Self-pay | Admitting: Nurse Practitioner

## 2018-10-02 ENCOUNTER — Ambulatory Visit (INDEPENDENT_AMBULATORY_CARE_PROVIDER_SITE_OTHER): Payer: Managed Care, Other (non HMO) | Admitting: Nurse Practitioner

## 2018-10-02 VITALS — BP 122/76 | HR 97 | Temp 96.9°F | Resp 14 | Ht 69.0 in | Wt 187.8 lb

## 2018-10-02 DIAGNOSIS — I10 Essential (primary) hypertension: Secondary | ICD-10-CM

## 2018-10-02 DIAGNOSIS — E782 Mixed hyperlipidemia: Secondary | ICD-10-CM | POA: Diagnosis not present

## 2018-10-02 DIAGNOSIS — M5136 Other intervertebral disc degeneration, lumbar region: Secondary | ICD-10-CM | POA: Insufficient documentation

## 2018-10-02 DIAGNOSIS — G4733 Obstructive sleep apnea (adult) (pediatric): Secondary | ICD-10-CM

## 2018-10-02 DIAGNOSIS — E663 Overweight: Secondary | ICD-10-CM

## 2018-10-02 DIAGNOSIS — N138 Other obstructive and reflux uropathy: Secondary | ICD-10-CM

## 2018-10-02 DIAGNOSIS — N401 Enlarged prostate with lower urinary tract symptoms: Secondary | ICD-10-CM

## 2018-10-02 DIAGNOSIS — Z5181 Encounter for therapeutic drug level monitoring: Secondary | ICD-10-CM

## 2018-10-02 DIAGNOSIS — R413 Other amnesia: Secondary | ICD-10-CM

## 2018-10-02 DIAGNOSIS — N529 Male erectile dysfunction, unspecified: Secondary | ICD-10-CM

## 2018-10-02 MED ORDER — PRAVASTATIN SODIUM 20 MG PO TABS: 20.0000 mg | ORAL_TABLET | Freq: Every day | ORAL | 1 refills | Status: DC

## 2018-10-02 MED ORDER — LISINOPRIL 20 MG PO TABS: 20.0000 mg | ORAL_TABLET | Freq: Every day | ORAL | 1 refills | Status: DC

## 2018-10-02 NOTE — Patient Instructions (Addendum)
Neurology  Ray Church, [place]  [facility]  [place], [place] [address]  [phone]  - wear your CPAP whenever you are sleeping.   Memory Compensation Strategies  1. Use "WARM" strategy.  W= write it down  A= associate it  R= repeat it  M= make a mental note  2.   You can keep a Social worker.  Use a 3-ring notebook with sections for the following: calendar, important names and phone numbers,  medications, doctors' names/phone numbers, lists/reminders, and a section to journal what you did  each day.   3.    Use a calendar to write appointments down.  4.    Write yourself a schedule for the day.  This can be placed on the calendar or in a separate section of the Memory Notebook.  Keeping a  regular schedule can help memory.  5.    Use medication organizer with sections for each day or morning/evening pills.  You may need help loading it  6.    Keep a basket, or pegboard by the door.  Place items that you need to take out with you in the basket or on the pegboard.  You may also want to  include a message board for reminders.  7.    Use sticky notes.  Place sticky notes with reminders in a place where the task is performed.  For example: " turn off the  stove" placed by the stove, "lock the door" placed on the door at eye level, " take your medications" on  the bathroom mirror or by the place where you normally take your medications.  8.    Use alarms/timers.  Use while cooking to remind yourself to check on food or as a reminder to take your medicine, or as a  reminder to make a call, or as a reminder to perform another task, etc.

## 2018-10-02 NOTE — Progress Notes (Signed)
Name: Eric Rose   MRN: [id number]    DOB: Dec 01, 1955   Date:10/02/2018       Progress Note  Subjective  Chief Complaint  Chief Complaint  Patient presents with  . Follow-up    HPI  Hypertension Patient is on lisinopril 20mg  daily .  Takes medications as prescribed with few missed doses a month.  He is compliant with low-salt diet.  He checks blood pressures at home with range of 120-130/70-80's Patient has Erectile dysfunction- takes cialis just as needed  Denies chest pain, headaches, blurry vision, dizziness, lightheadedness.  BP Readings from Last 3 Encounters:  10/02/18 122/76  05/16/18 120/81  05/06/18 107/62    Hyperlipidemia Patient rx pravastatin 20 mg daily Takes medications as prescribed with no missed doses a month.  Diet:eats vegetables and fruits daily. Eats fried foods a few times a week. Eats mostly chicken, [place].  Denies myalgias Lab Results  Component Value Date   CHOL 141 03/12/2018   HDL 54 03/12/2018   LDLCALC 74 03/12/2018   TRIG 45 03/12/2018   CHOLHDL 2.6 03/12/2018   BPH Patient has BPH and has has elevated psa, follows up with urology- Larene Beach PA-C Takes cialis 5mg  daily   Sleep apnea Self reported CPAP compliance 60-80%  Osteoarthritis Diffuse but mainly lower back. Is taking glucosamine and chondroitin supplements and vitamin D supplementation.  Memory impairment Sees neurology [name]; last seen in January of this year. He is taking vitamin E. Works on memorizing things to improve memory and writes things down.   PHQ2/9: Depression screen [facility] 2/9 10/02/2018 04/04/2018 03/12/2018 02/21/2017 02/07/2016  Decreased Interest 0 0 0 0 0  Down, Depressed, Hopeless 0 0 0 0 0  PHQ - 2 Score 0 0 0 0 0  Altered sleeping 0 0 0 - -  Tired, decreased energy 0 0 0 - -  Change in appetite 0 0 0 - -  Feeling bad or failure about yourself  0 0 0 - -  Trouble concentrating 0 0 0 - -  Moving slowly or fidgety/restless 0 0 0 - -  Suicidal  thoughts 0 0 0 - -  PHQ-9 Score 0 0 0 - -  Difficult doing work/chores Not difficult at all Not difficult at all Not difficult at all - -    PHQ reviewed. Negative  Patient Active Problem List   Diagnosis Date Noted  . Degeneration of lumbar intervertebral disc 10/02/2018  . Obstructive sleep apnea 04/04/2018  . Hyperlipidemia 04/04/2018  . Lump in neck 02/21/2017  . 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 Memory impairment 02/07/2016  . Erectile dysfunction 08/08/2015  . Anemia 08/06/2015  . Essential hypertension, benign 08/06/2015  . 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 Elevated PSA 01/12/2015  . BPH with obstruction/lower urinary tract symptoms 01/12/2015    Past Medical History:  Diagnosis Date  . Allergy   . Anxiety   . BPH (benign prostatic hypertrophy) with urinary obstruction    followed by urologist  . Decreased libido   . Dementia ([place])   . Erectile dysfunction 08/08/2015  . GERD (gastroesophageal reflux disease)   . History of pneumonia   . Hypertension   . Lumbago   . 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 JOINT REPLACEMENT Bilateral    hip  . 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   Procedure:  TOTAL HIP ARTHROPLASTY;  Surgeon: Earnestine Leys, MD;  Location: [facility] ORS;  Service: Orthopedics;  Laterality: Left;    Social History   Tobacco Use  . Smoking status: Never Smoker  . Smokeless tobacco: Never Used  Substance Use Topics  . Alcohol use: No    Alcohol/week: 0.0 standard drinks     Current Outpatient Medications:  .  Cyanocobalamin (B-12 PO), Take 1 tablet by mouth daily., Disp: , Rfl:  .  Glucosamine-Chondroit-Vit C-Mn (GLUCOSAMINE 1500 COMPLEX PO), Take by mouth., Disp: , Rfl:  .  lisinopril (ZESTRIL) 20 MG tablet, Take 1 tablet by mouth once daily, Disp: 90 tablet, Rfl: 0 .  Multiple Vitamin (MULTIVITAMIN) capsule, Take 1  capsule by mouth daily., Disp: , Rfl:  .  pravastatin (PRAVACHOL) 20 MG tablet, TAKE 1 TABLET BY MOUTH AT BEDTIME, Disp: 30 tablet, Rfl: 0 .  tadalafil (CIALIS) 5 MG tablet, TAKE 1 TABLET BY MOUTH EVERY DAY FOR ERECTILE DYSFUNCTION., Disp: 30 tablet, Rfl: 2 .  VITAMIN D, ERGOCALCIFEROL, PO, Take 1,000 mg by mouth daily. , Disp: , Rfl:  .  VITAMIN E PO, Take 1 capsule by mouth daily., Disp: , Rfl:  .  aspirin EC 81 MG tablet, Take 1 tablet (81 mg total) by mouth daily., Disp: , Rfl:   No Known Allergies  Review of Systems  Constitutional: Negative for chills, fever and malaise/fatigue.  HENT: Negative for congestion, sinus pain and sore throat.   Eyes: Negative for blurred vision.  Respiratory: Negative for cough and shortness of breath.   Cardiovascular: Negative for chest pain, palpitations and leg swelling.  Gastrointestinal: Negative for abdominal pain, constipation, diarrhea and nausea.  Genitourinary: Negative for dysuria.  Musculoskeletal: Negative for falls and joint pain.  Skin: Negative for rash.  Neurological: Negative for dizziness and headaches.  Endo/Heme/Allergies: Negative for polydipsia.  Psychiatric/Behavioral: The patient is not nervous/anxious and does not have insomnia.       No other specific complaints in a complete review of systems (except as listed in HPI above).  Objective  Vitals:   10/02/18 1417  BP: 122/76  Pulse: 97  Resp: 14  Temp: (!) 96.9 F (36.1 C)  TempSrc: Temporal  SpO2: 97%  Weight: 187 lb 12.8 oz (85.2 kg)  Height: 5\' 9"  (1.753 m)    Body mass index is 27.73 kg/m.  Nursing Note and Vital Signs reviewed.  Physical Exam Vitals signs reviewed.  Constitutional:      Appearance: He is well-developed.  HENT:     Head: Normocephalic and atraumatic.  Neck:     Musculoskeletal: Normal range of motion and neck supple.     Vascular: No carotid bruit.  Cardiovascular:     Heart sounds: Normal heart sounds.  Pulmonary:     Effort:  Pulmonary effort is normal.     Breath sounds: Normal breath sounds.  Abdominal:     General: Bowel sounds are normal.     Palpations: Abdomen is soft.     Tenderness: There is no abdominal tenderness.  Musculoskeletal: Normal range of motion.  Skin:    General: Skin is warm and dry.     Capillary Refill: Capillary refill takes less than 2 seconds.  Neurological:     Mental Status: He is alert and oriented to person, place, and time.     GCS: GCS eye subscore is 4. GCS verbal subscore is 5. GCS motor subscore is 6.     Sensory: No sensory deficit.  Psychiatric:  Speech: Speech normal.        Behavior: Behavior normal.        Thought Content: Thought content normal.        Judgment: Judgment normal.        No results found for this or any previous visit (from the past 48 hour(s)).  Assessment & Plan  1. Essential hypertension, benign stable - COMPLETE METABOLIC PANEL WITH GFR - lisinopril (ZESTRIL) 20 MG tablet; Take 1 tablet (20 mg total) by mouth daily.  Dispense: 90 tablet; Refill: 1  2. Obstructive sleep apnea Wears cpap more   3. BPH with obstruction/lower urinary tract symptoms Follows up with urology  - COMPLETE METABOLIC PANEL WITH GFR  4. Mixed hyperlipidemia - Lipid Profile - pravastatin (PRAVACHOL) 20 MG tablet; Take 1 tablet (20 mg total) by mouth at bedtime.  Dispense: 90 tablet; Refill: 1  5. Erectile dysfunction, unspecified erectile dysfunction type Stable   6. Overweight (BMI 25.0-29.9) Discussed diet and exercise   7. Memory impairment Discussed memory tricks, healthy diet, consider coming of statin if ldl is well-controlled.   8. Medication monitoring encounter - COMPLETE METABOLIC PANEL WITH GFR

## 2018-10-03 ENCOUNTER — Ambulatory Visit: Payer: Managed Care, Other (non HMO) | Admitting: Family Medicine

## 2018-10-03 LAB — LIPID PANEL
Cholesterol: 140 mg/dL (ref <200)
HDL: 48 mg/dL (ref >=40)
LDL Cholesterol (Calc): 78 mg/dL (calc)
Non-HDL Cholesterol (Calc): 92 mg/dL (calc) (ref <130)
Total CHOL/HDL Ratio: 2.9 (calc) (ref <5.0)
Triglycerides: 55 mg/dL (ref <150)

## 2018-10-03 LAB — COMPLETE METABOLIC PANEL WITH GFR
AG Ratio: 1.7 (calc) (ref 1.0–2.5)
ALT: 22 U/L (ref 9–46)
AST: 26 U/L (ref 10–35)
Albumin: 4 g/dL (ref 3.6–5.1)
Alkaline phosphatase (APISO): 68 U/L (ref 35–144)
BUN: 12 mg/dL (ref 7–25)
CO2: 29 mmol/L (ref 20–32)
Calcium: 9.2 mg/dL (ref 8.6–10.3)
Chloride: 101 mmol/L (ref 98–110)
Creat: 0.95 mg/dL (ref 0.70–1.25)
GFR, Est African American: 98 mL/min/{1.73_m2} (ref >=60)
GFR, Est Non African American: 84 mL/min/{1.73_m2} (ref >=60)
Globulin: 2.3 g/dL (calc) (ref 1.9–3.7)
Glucose, Bld: 79 mg/dL (ref 65–99)
Potassium: 4.3 mmol/L (ref 3.5–5.3)
Sodium: 137 mmol/L (ref 135–146)
Total Bilirubin: 0.6 mg/dL (ref 0.2–1.2)
Total Protein: 6.3 g/dL (ref 6.1–8.1)

## 2018-11-19 ENCOUNTER — Ambulatory Visit (INDEPENDENT_AMBULATORY_CARE_PROVIDER_SITE_OTHER): Payer: Managed Care, Other (non HMO)

## 2018-11-19 ENCOUNTER — Other Ambulatory Visit: Payer: Self-pay

## 2018-11-19 DIAGNOSIS — Z23 Encounter for immunization: Secondary | ICD-10-CM | POA: Diagnosis not present

## 2018-11-22 ENCOUNTER — Other Ambulatory Visit: Payer: Managed Care, Other (non HMO)

## 2018-11-22 ENCOUNTER — Other Ambulatory Visit: Payer: Self-pay

## 2018-11-22 DIAGNOSIS — R972 Elevated prostate specific antigen [PSA]: Secondary | ICD-10-CM

## 2018-11-23 LAB — PSA: Prostate Specific Ag, Serum: 2.4 ng/mL (ref 0.0–4.0)

## 2018-11-25 ENCOUNTER — Telehealth: Payer: Self-pay | Admitting: *Deleted

## 2018-11-25 NOTE — Telephone Encounter (Signed)
Could not leave message.

## 2018-11-25 NOTE — Telephone Encounter (Signed)
Notified patient as instructed, patient pleased. Discussed follow-up appointments, patient agrees. Please scheduled for six month follow PAS prior.

## 2018-11-25 NOTE — Telephone Encounter (Signed)
-----   Message from Nori Riis, PA-C sent at 11/23/2018  4:49 PM EDT ----- Please let Eric Rose know that his PSA is still normal.  I would like to see him in the office in 6 months with a PSA prior.

## 2018-11-25 NOTE — Telephone Encounter (Signed)
Appt made and mailed to pt

## 2018-12-11 ENCOUNTER — Other Ambulatory Visit: Payer: Self-pay | Admitting: Family Medicine

## 2018-12-11 NOTE — Telephone Encounter (Signed)
Requested medication (s) are due for refill today: yes  Requested medication (s) are on the active medication list: yes  Last refill: 05/18/2018   Future visit scheduled: yes  Notes to clinic:  Review for refill   Requested Prescriptions  Pending Prescriptions Disp Refills   tadalafil (CIALIS) 5 MG tablet 30 tablet 2    Sig: TAKE 1 TABLET BY MOUTH EVERY DAY FOR ERECTILE DYSFUNCTION.     Urology: Erectile Dysfunction Agents Passed - 12/11/2018  1:50 PM      Passed - Last BP in normal range    BP Readings from Last 1 Encounters:  10/02/18 122/76         Passed - Valid encounter within last 12 months    Recent Outpatient Visits          2 months ago Essential hypertension, benign   [facility] [place], Bethel Born, NP   8 months ago Nodule of neck   [place], MD   9 months ago Preventative health care   [place], MD   9 months ago Nodule of neck   [place], NP   1 year ago Preventative health care   [place], Satira Anis, MD      Future Appointments            In 3 months Uvaldo Rising, Astrid Divine, [facility], [place]   In 5 months [place], Gordan Payment [facility]

## 2018-12-11 NOTE — Telephone Encounter (Signed)
Medication Refill - Medication: tadalafil (CIALIS) 5 MG tablet    Has the patient contacted their pharmacy? Yes.   (Agent: If no, request that the patient contact the pharmacy for the refill.) (Agent: If yes, when and what did the pharmacy advise?)  Preferred Pharmacy (with phone number or street name):  [facility] [id number], [place] - [address]  [place] [address]  Phone: [phone] Fax: [phone]     Agent: Please be advised that RX refills may take up to 3 business days. We ask that you follow-up with your pharmacy.

## 2018-12-12 MED ORDER — TADALAFIL 5 MG PO TABS: ORAL_TABLET | ORAL | 2 refills | Status: DC

## 2018-12-12 NOTE — Telephone Encounter (Signed)
Tried calling patient to inform him that his the cialis rx has been sent to his pharmacy.  Unable to leave message due to voicemail box being full.

## 2019-01-29 ENCOUNTER — Other Ambulatory Visit: Payer: Self-pay | Admitting: Family Medicine

## 2019-01-29 MED ORDER — TADALAFIL 5 MG PO TABS: ORAL_TABLET | ORAL | 2 refills | Status: DC

## 2019-01-29 NOTE — Progress Notes (Signed)
Pt called in requesting Cialis be sent to [facility] due to cost at his other pharmacy/insurance not covering.  This is sent in.

## 2019-02-03 DIAGNOSIS — F5104 Psychophysiologic insomnia: Secondary | ICD-10-CM | POA: Diagnosis not present

## 2019-02-03 DIAGNOSIS — R4189 Other symptoms and signs involving cognitive functions and awareness: Secondary | ICD-10-CM | POA: Diagnosis not present

## 2019-02-03 DIAGNOSIS — G4733 Obstructive sleep apnea (adult) (pediatric): Secondary | ICD-10-CM | POA: Diagnosis not present

## 2019-02-26 ENCOUNTER — Other Ambulatory Visit: Payer: Self-pay | Admitting: Family Medicine

## 2019-02-26 DIAGNOSIS — R972 Elevated prostate specific antigen [PSA]: Secondary | ICD-10-CM

## 2019-02-26 NOTE — Telephone Encounter (Signed)
Patient notified and appointment has been scheduled 

## 2019-02-26 NOTE — Telephone Encounter (Signed)
Please let Mr. Cleto know that I wanted to check his PSA this month to make sure it doesn't increase by his March appointment.

## 2019-03-19 ENCOUNTER — Other Ambulatory Visit: Payer: PPO

## 2019-03-19 ENCOUNTER — Other Ambulatory Visit: Payer: Self-pay

## 2019-03-19 DIAGNOSIS — R972 Elevated prostate specific antigen [PSA]: Secondary | ICD-10-CM | POA: Diagnosis not present

## 2019-03-20 ENCOUNTER — Telehealth: Payer: Self-pay | Admitting: Family Medicine

## 2019-03-20 LAB — PSA: Prostate Specific Ag, Serum: 2.4 ng/mL (ref 0.0–4.0)

## 2019-03-20 NOTE — Telephone Encounter (Signed)
-----   Message from Nori Riis, PA-C sent at 03/20/2019  7:49 AM EST ----- Please let Eric Rose know that his PSA is stable at 2.4 and we will see him in March.

## 2019-03-20 NOTE — Telephone Encounter (Signed)
Patient notified and voiced understanding.

## 2019-04-04 ENCOUNTER — Ambulatory Visit: Payer: Managed Care, Other (non HMO) | Admitting: Family Medicine

## 2019-04-07 ENCOUNTER — Ambulatory Visit (INDEPENDENT_AMBULATORY_CARE_PROVIDER_SITE_OTHER): Payer: PPO | Admitting: Family Medicine

## 2019-04-07 ENCOUNTER — Encounter: Payer: Self-pay | Admitting: Family Medicine

## 2019-04-07 ENCOUNTER — Other Ambulatory Visit: Payer: Self-pay

## 2019-04-07 VITALS — BP 128/74 | HR 68 | Temp 97.8°F | Resp 16 | Ht 69.0 in | Wt 191.8 lb

## 2019-04-07 DIAGNOSIS — R413 Other amnesia: Secondary | ICD-10-CM | POA: Diagnosis not present

## 2019-04-07 DIAGNOSIS — E663 Overweight: Secondary | ICD-10-CM | POA: Diagnosis not present

## 2019-04-07 DIAGNOSIS — I10 Essential (primary) hypertension: Secondary | ICD-10-CM | POA: Diagnosis not present

## 2019-04-07 DIAGNOSIS — E782 Mixed hyperlipidemia: Secondary | ICD-10-CM

## 2019-04-07 DIAGNOSIS — N138 Other obstructive and reflux uropathy: Secondary | ICD-10-CM

## 2019-04-07 DIAGNOSIS — N529 Male erectile dysfunction, unspecified: Secondary | ICD-10-CM | POA: Diagnosis not present

## 2019-04-07 DIAGNOSIS — N401 Enlarged prostate with lower urinary tract symptoms: Secondary | ICD-10-CM | POA: Diagnosis not present

## 2019-04-07 DIAGNOSIS — G4733 Obstructive sleep apnea (adult) (pediatric): Secondary | ICD-10-CM

## 2019-04-07 LAB — COMPLETE METABOLIC PANEL WITH GFR
AG Ratio: 1.6 (calc) (ref 1.0–2.5)
ALT: 23 U/L (ref 9–46)
AST: 28 U/L (ref 10–35)
Albumin: 3.9 g/dL (ref 3.6–5.1)
Alkaline phosphatase (APISO): 67 U/L (ref 35–144)
BUN: 10 mg/dL (ref 7–25)
CO2: 30 mmol/L (ref 20–32)
Calcium: 9.2 mg/dL (ref 8.6–10.3)
Chloride: 104 mmol/L (ref 98–110)
Creat: 0.9 mg/dL (ref 0.70–1.25)
GFR, Est African American: 104 mL/min/{1.73_m2} (ref >=60)
GFR, Est Non African American: 89 mL/min/{1.73_m2} (ref >=60)
Globulin: 2.5 g/dL (calc) (ref 1.9–3.7)
Glucose, Bld: 69 mg/dL (ref 65–99)
Potassium: 4.5 mmol/L (ref 3.5–5.3)
Sodium: 138 mmol/L (ref 135–146)
Total Bilirubin: 0.4 mg/dL (ref 0.2–1.2)
Total Protein: 6.4 g/dL (ref 6.1–8.1)

## 2019-04-07 MED ORDER — LISINOPRIL 20 MG PO TABS: 20.0000 mg | ORAL_TABLET | Freq: Every day | ORAL | 1 refills | Status: DC

## 2019-04-07 MED ORDER — PRAVASTATIN SODIUM 20 MG PO TABS: 20.0000 mg | ORAL_TABLET | Freq: Every day | ORAL | 1 refills | Status: DC

## 2019-04-07 NOTE — Progress Notes (Signed)
Name: Eric Rose   MRN: [id number]    DOB: May 16, 1955   Date:04/07/2019       Progress Note  Subjective  Chief Complaint  Chief Complaint  Patient presents with  . Hyperlipidemia    follow up, medication refills  . Hypertension    HPI  Hypertension Patient is on lisinopril 20mg  daily .  Takes medications as prescribed with few missed doses a month.  He is compliant with low-salt diet.  He checks blood pressures at home with range of 120-130/70-80's Patient has Erectile dysfunction- takes cialis just as needed. Denies chest pain, shortness of breath, headaches, blurry vision, dizziness, lightheadedness.   Hyperlipidemia Patient rx pravastatin 20 mg daily and 81mg  ASA daily. Takes medications as prescribed with no missed doses a month.  Diet:eats vegetables and fruits daily. Eats fried foods a few times a week. Eats mostly chicken, [place].  Denies myalgias, chest pain.  BPH Patient has BPH and has had elevated psa, follows up with urology- Larene Beach PA-C Takes cialis PRN only.  Most recent PSA was stable over the last 6 months.   Sleep apnea Self reported CPAP compliance is about 60%. He does note having had an issue last night temporarily where the pressure was too much - he adjusted his settings and it did not occur again.   Osteoarthritis Diffuse but mainly lower back. Is taking glucosamine and chondroitin supplements and vitamin D supplementation.  States pain is mostly controlled.  Has history bilateral hip replacements and these get achy sometimes.   Overweight Does either bike, eliptical, or weight lifting about 5/7 nights a week. Diet has been balanced, not particularly low fat.   Memory impairment Sees neurology [name] & Gayland Curry PA-C; last seen in November 2020. He is taking vitamin E and namenda 10mg  BID.  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 He is doing well driving, not getting lost, keeping busy at home, on the  computer, and keeping up with his family.   Patient Active Problem List   Diagnosis Date Noted  . Degeneration of lumbar intervertebral disc 10/02/2018  . Obstructive sleep apnea 04/04/2018  . Hyperlipidemia 04/04/2018  . Lump in neck 02/21/2017  . 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 Memory impairment 02/07/2016  . Erectile dysfunction 08/08/2015  . Anemia 08/06/2015  . Essential hypertension, benign 08/06/2015  . 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 Elevated PSA 01/12/2015  . BPH with obstruction/lower urinary tract symptoms 01/12/2015    Past Surgical History:  Procedure Laterality Date  . JOINT REPLACEMENT Bilateral    hip  . 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   Procedure: TOTAL HIP ARTHROPLASTY;  Surgeon: Earnestine Leys, MD;  Location: [facility] ORS;  Service: Orthopedics;  Laterality: Left;    Family History  Problem Relation Age of Onset  . Diabetes Mother   . Heart disease Mother   . Alzheimer's disease Mother   . Prostate cancer Brother   . Prostate cancer Paternal Uncle   . Bladder Cancer Neg Hx   . Kidney cancer Neg Hx     Social History   Socioeconomic History  . Marital status: Married    Spouse name: [name]  . Number of children: 3  . Years of education: 15  . Highest education level: Some college, no degree  Occupational History  . Not on file  Tobacco Use  . Smoking status: Never Smoker  . Smokeless tobacco: Never Used  Substance and Sexual Activity  .  Alcohol use: No    Alcohol/week: 0.0 standard drinks  . Drug use: No  . Sexual activity: Yes  Other Topics Concern  . Not on file  Social History Narrative  . Not on file   Social Determinants of Health   Financial Resource Strain:   . Difficulty of Paying Living Expenses: Not on file  Food Insecurity:   . Worried About Charity fundraiser in the Last Year: Not on file  . Ran Out of Food in the Last Year: Not on file  Transportation Needs:   . Lack of Transportation (Medical): Not on  file  . Lack of Transportation (Non-Medical): Not on file  Physical Activity:   . Days of Exercise per Week: Not on file  . Minutes of Exercise per Session: Not on file  Stress:   . Feeling of Stress : Not on file  Social Connections:   . Frequency of Communication with Friends and Family: Not on file  . Frequency of Social Gatherings with Friends and Family: Not on file  . Attends Religious Services: Not on file  . Active Member of Clubs or Organizations: Not on file  . Attends Archivist Meetings: Not on file  . Marital Status: Not on file  Intimate Partner Violence:   . Fear of Current or Ex-Partner: Not on file  . Emotionally Abused: Not on file  . Physically Abused: Not on file  . Sexually Abused: Not on file     Current Outpatient Medications:  .  aspirin EC 81 MG tablet, Take 1 tablet (81 mg total) by mouth daily., Disp: , Rfl:  .  Cyanocobalamin (B-12 PO), Take 1 tablet by mouth daily., Disp: , Rfl:  .  Glucosamine-Chondroit-Vit C-Mn (GLUCOSAMINE 1500 COMPLEX PO), Take by mouth., Disp: , Rfl:  .  lisinopril (ZESTRIL) 20 MG tablet, Take 1 tablet (20 mg total) by mouth daily., Disp: 90 tablet, Rfl: 1 .  memantine (NAMENDA) 5 MG tablet, Take 5 mg by mouth 2 (two) times daily., Disp: , Rfl:  .  Multiple Vitamin (MULTIVITAMIN) capsule, Take 1 capsule by mouth daily., Disp: , Rfl:  .  pravastatin (PRAVACHOL) 20 MG tablet, Take 1 tablet (20 mg total) by mouth at bedtime., Disp: 90 tablet, Rfl: 1 .  tadalafil (CIALIS) 5 MG tablet, TAKE 1 TABLET BY MOUTH EVERY DAY FOR ERECTILE DYSFUNCTION., Disp: 30 tablet, Rfl: 2 .  VITAMIN D, ERGOCALCIFEROL, PO, Take 1,000 mg by mouth daily. , Disp: , Rfl:  .  VITAMIN E PO, Take 1 capsule by mouth daily., Disp: , Rfl:   No Known Allergies  I personally reviewed active problem list, medication list, allergies, health maintenance, notes from last encounter, lab results with the patient/caregiver today.   ROS  Constitutional: Negative  for fever or weight change.  Respiratory: Negative for cough and shortness of breath.   Cardiovascular: Negative for chest pain or palpitations.  Gastrointestinal: Negative for abdominal pain, no bowel changes.  Musculoskeletal: Negative for gait problem or joint swelling.  Skin: Negative for rash.  Neurological: Negative for dizziness or headache.  No other specific complaints in a complete review of systems (except as listed in HPI above)  Objective  Vitals:   04/07/19 1050  BP: 128/74  Pulse: 68  Resp: 16  Temp: 97.8 F (36.6 C)  TempSrc: Temporal  SpO2: 98%  Weight: 191 lb 12.8 oz (87 kg)  Height: 5\' 9"  (1.753 m)    Body mass index is 28.32 kg/m.  Physical  Exam  Constitutional: Patient appears well-developed and well-nourished. No distress.  HENT: Head: Normocephalic and atraumatic.  Eyes: Conjunctivae and EOM are normal. No scleral icterus.  Neck: Normal range of motion. Neck supple. No JVD present.  Cardiovascular: Normal rate, regular rhythm and normal heart sounds.  No murmur heard. No BLE edema. Pulmonary/Chest: Effort normal and breath sounds normal. No respiratory distress. Musculoskeletal: Normal range of motion, no joint effusions. No gross deformities Neurological: Pt is alert and oriented to person, place, and time. No cranial nerve deficit. Coordination, balance, strength, speech and gait are normal.  Skin: Skin is warm and dry. No rash noted. No erythema.  Psychiatric: Patient has a normal mood and affect. behavior is normal. Judgment and thought content normal.  No results found for this or any previous visit (from the past 72 hour(s)).  PHQ2/9: Depression screen [facility] 2/9 04/07/2019 10/02/2018 04/04/2018 03/12/2018 02/21/2017  Decreased Interest 0 0 0 0 0  Down, Depressed, Hopeless 0 0 0 0 0  PHQ - 2 Score 0 0 0 0 0  Altered sleeping 0 0 0 0 -  Tired, decreased energy 0 0 0 0 -  Change in appetite 0 0 0 0 -  Feeling bad or failure about yourself  0 0 0  0 -  Trouble concentrating 0 0 0 0 -  Moving slowly or fidgety/restless 0 0 0 0 -  Suicidal thoughts 0 0 0 0 -  PHQ-9 Score 0 0 0 0 -  Difficult doing work/chores Not difficult at all Not difficult at all Not difficult at all Not difficult at all -   PHQ-2/9 Result is negative.    Fall Risk: Fall Risk  04/07/2019 10/02/2018 04/04/2018 03/12/2018 02/21/2017  Falls in the past year? 0 0 0 0 No  Number falls in past yr: 0 0 0 0 -  Injury with Fall? 0 0 0 0 -  Follow up Falls evaluation completed - - - -   Assessment & Plan  1. Essential hypertension, benign - DASH diet discussed - lisinopril (ZESTRIL) 20 MG tablet; Take 1 tablet (20 mg total) by mouth daily.  Dispense: 90 tablet; Refill: 1 - COMPLETE METABOLIC PANEL WITH GFR  2. Mixed hyperlipidemia - Discussed diet in detail. - pravastatin (PRAVACHOL) 20 MG tablet; Take 1 tablet (20 mg total) by mouth at bedtime.  Dispense: 90 tablet; Refill: 1  3. BPH with obstruction/lower urinary tract symptoms - Cialis PRN. Seeing Urology.  4. Erectile dysfunction, unspecified erectile dysfunction type - Cialis PRN  5. Obstructive sleep apnea - Encouraged better compliance, will call back if machine settings need adjustment and we will reconnect him with pulmonology  6. Overweight (BMI 25.0-29.9) - Discussed with the patient the risk posed by an increased BMI. Discussed importance of portion control, calorie counting and at least 150 minutes of physical activity weekly. Avoid sweet beverages and drink more water. Eat at least 6 servings of fruit and vegetables daily  - COMPLETE METABOLIC PANEL WITH GFR  7. Memory impairment - Continue with namenda and vitamin E.  Seeing neurology.

## 2019-04-07 NOTE — Patient Instructions (Signed)
You may take 1000IU once daily of vitamin D

## 2019-05-15 ENCOUNTER — Ambulatory Visit: Payer: PPO | Attending: Internal Medicine

## 2019-05-15 DIAGNOSIS — Z23 Encounter for immunization: Secondary | ICD-10-CM | POA: Insufficient documentation

## 2019-05-15 NOTE — Progress Notes (Signed)
   Covid-19 Vaccination Clinic  Name:  DELAWRENCE PORTIS    MRN: [id number] DOB: [birth year]  05/15/2019  Mr. Shallenberger was observed post Covid-19 immunization for 30 minutes based on pre-vaccination screening without incident. He was provided with Vaccine Information Sheet and instruction to access the V-Safe system. He stated he felt dizzy after his 15 minutes, we watched him an additional 15 minutes and took his vital signs, T 98, P 100, R 18, BP 147/88. He states he feels fine after the 30 minute wait.  Mr. Whitmill was instructed to call 911 with any severe reactions post vaccine: Marland Kitchen Difficulty breathing  . Swelling of face and throat  . A fast heartbeat  . A bad rash all over body  . Dizziness and weakness   Immunizations Administered    Name Date Dose VIS Date Route   Pfizer COVID-19 Vaccine 05/15/2019  9:46 AM 0.3 mL 02/21/2019 Intramuscular   Manufacturer: [place]   Lot: [id number]   [place]: [id number]

## 2019-05-19 ENCOUNTER — Other Ambulatory Visit: Payer: Self-pay | Admitting: *Deleted

## 2019-05-19 DIAGNOSIS — R972 Elevated prostate specific antigen [PSA]: Secondary | ICD-10-CM

## 2019-05-20 ENCOUNTER — Other Ambulatory Visit: Payer: PPO

## 2019-05-22 ENCOUNTER — Encounter: Payer: Self-pay | Admitting: Urology

## 2019-05-23 ENCOUNTER — Other Ambulatory Visit: Payer: Self-pay

## 2019-05-23 ENCOUNTER — Other Ambulatory Visit: Payer: PPO

## 2019-05-23 DIAGNOSIS — R972 Elevated prostate specific antigen [PSA]: Secondary | ICD-10-CM | POA: Diagnosis not present

## 2019-05-24 LAB — PSA: Prostate Specific Ag, Serum: 2.7 ng/mL (ref 0.0–4.0)

## 2019-05-27 ENCOUNTER — Other Ambulatory Visit: Payer: Self-pay

## 2019-05-27 ENCOUNTER — Ambulatory Visit: Payer: PPO | Admitting: Urology

## 2019-05-27 ENCOUNTER — Encounter: Payer: Self-pay | Admitting: Urology

## 2019-05-27 ENCOUNTER — Other Ambulatory Visit: Payer: Self-pay | Admitting: Family Medicine

## 2019-05-27 VITALS — BP 126/87 | HR 94 | Ht 69.0 in | Wt 185.0 lb

## 2019-05-27 DIAGNOSIS — N401 Enlarged prostate with lower urinary tract symptoms: Secondary | ICD-10-CM

## 2019-05-27 DIAGNOSIS — R972 Elevated prostate specific antigen [PSA]: Secondary | ICD-10-CM

## 2019-05-27 DIAGNOSIS — Z87898 Personal history of other specified conditions: Secondary | ICD-10-CM | POA: Diagnosis not present

## 2019-05-27 DIAGNOSIS — N138 Other obstructive and reflux uropathy: Secondary | ICD-10-CM | POA: Diagnosis not present

## 2019-05-27 LAB — BLADDER SCAN AMB NON-IMAGING: Scan Result: 62

## 2019-05-27 NOTE — Progress Notes (Signed)
05/27/2019  10:22 AM   Eric Rose Jul 01, 1955 [id number]  Referring provider: Arnetha Courser, MD No address on file  Chief Complaint  Patient presents with  . Benign Prostatic Hypertrophy   HPI: Patient is a 64 year old male with mild dementia, a history of an elevated PSA and  BPH with LUTS presents today for 6 month follow up.      PSA Elevation Component     Latest Ref Rng & Units 12/29/2014 01/12/2015 04/08/2015 07/29/2015  Prostate Specific Ag, Serum     0.0 - 4.0 ng/mL 4.8 (H) 3.6 2.5 3.5   Component     Latest Ref Rng & Units 08/23/2015 10/03/2016 03/29/2017 11/15/2017  Prostate Specific Ag, Serum     0.0 - 4.0 ng/mL 4.9 (H) 2.6 1.5 0.8   Component     Latest Ref Rng & Units 05/16/2018 11/22/2018 03/19/2019 05/23/2019  Prostate Specific Ag, Serum     0.0 - 4.0 ng/mL 1.5 2.4 2.4 2.7  Patient had been on Jalyn in the past, but he had discontinued the medication sometime after is appointment in 11/2017.    BPH WITH LUTS  (prostate and/or bladder) IPSS score: 11/3   PVR: 62 mL  Previous score: 9/2     Major complaint(s):  Nocturia x 3-4 x last few months.  He attributes it to drinking soda, coffee and sweet tea after lunch and with dinner.  He states the nocturia is decreased when he just drinks water and uses his CPAP machine.  Patient denies any modifying or aggravating factors.  Patient denies any gross hematuria, dysuria or suprapubic/flank pain.  Patient denies any fevers, chills, nausea or vomiting.   His father, brother and paternal uncle have been diagnosed with prostate cancer.    IPSS    Row Name 05/27/19 0900         International Prostate Symptom Score   How often have you had the sensation of not emptying your bladder?  Less than half the time     How often have you had to urinate less than every two hours?  Less than 1 in 5 times     How often have you found you stopped and started again several times when you urinated?  Less than half the time     How  often have you found it difficult to postpone urination?  More than half the time     How often have you had a weak urinary stream?  Not at All     How often have you had to strain to start urination?  Not at All     How many times did you typically get up at night to urinate?  2 Times     Total IPSS Score  11       Quality of Life due to urinary symptoms   If you were to spend the rest of your life with your urinary condition just the way it is now how would you feel about that?  Mixed        Score:  1-7 Mild 8-19 Moderate 20-35 Severe  PMH: Past Medical History:  Diagnosis Date  . Allergy   . Anxiety   . BPH (benign prostatic hypertrophy) with urinary obstruction    followed by urologist  . Decreased libido   . Dementia ([place])   . Erectile dysfunction 08/08/2015  . GERD (gastroesophageal reflux disease)   . History of pneumonia   . Hypertension   . Lumbago   .  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 JOINT REPLACEMENT Bilateral    hip  . 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   Procedure: TOTAL HIP ARTHROPLASTY;  Surgeon: Earnestine Leys, MD;  Location: [facility] ORS;  Service: Orthopedics;  Laterality: Left;    Home Medications:  Allergies as of 05/27/2019   No Known Allergies     Medication List       Accurate as of May 27, 2019 10:22 AM. If you have any questions, ask your nurse or doctor.        aspirin EC 81 MG tablet Take 1 tablet (81 mg total) by mouth daily.   B-12 PO Take 1 tablet by mouth daily.   GLUCOSAMINE 1500 COMPLEX PO Take by mouth.   lisinopril 20 MG tablet Commonly known as: ZESTRIL Take 1 tablet (20 mg total) by mouth daily.   memantine 5 MG tablet Commonly known as: NAMENDA Take 5 mg by mouth 2 (two) times daily.   multivitamin capsule Take 1 capsule by mouth daily.   pravastatin 20 MG tablet Commonly  known as: PRAVACHOL Take 1 tablet (20 mg total) by mouth at bedtime.   tadalafil 5 MG tablet Commonly known as: CIALIS TAKE 1 TABLET BY MOUTH EVERY DAY FOR ERECTILE DYSFUNCTION.   VITAMIN D (ERGOCALCIFEROL) PO Take 1,000 mg by mouth daily.   VITAMIN E PO Take 1 capsule by mouth daily.       Allergies: No Known Allergies  Family History: Family History  Problem Relation Age of Onset  . Diabetes Mother   . Heart disease Mother   . Alzheimer's disease Mother   . Prostate cancer Father   . Prostate cancer Brother   . Prostate cancer Paternal Uncle   . Bladder Cancer Neg Hx   . Kidney cancer Neg Hx     Social History:  reports that he has never smoked. He has never used smokeless tobacco. He reports that he does not drink alcohol or use drugs.  ROS: For pertinent review of systems please refer to history of present illness  Physical Exam: BP 126/87   Pulse 94   Ht 5\' 9"  (1.753 m)   Wt 185 lb (83.9 kg)   BMI 27.32 kg/m   Constitutional:  Well nourished. Alert and oriented, No acute distress. HEENT: [place] AT, mask in place.  Trachea midline, no masses. Cardiovascular: No clubbing, cyanosis, or edema. Respiratory: Normal respiratory effort, no increased work of breathing. GI: Abdomen is soft, non tender, non distended, no abdominal masses. Liver and spleen not palpable.  No hernias appreciated.  Stool sample for occult testing is not indicated.   GU: No CVA tenderness.  No bladder fullness or masses.  Patient with uncircumcised phallus.  Foreskin easily retracted  Urethral meatus is patent.  No penile discharge. No penile lesions or rashes. Scrotum without lesions, cysts, rashes and/or edema.  Testicles are located scrotally bilaterally. No masses are appreciated in the testicles. Left and right epididymis are normal. Rectal: Patient with  normal sphincter tone. Anus and perineum without scarring or rashes. No rectal masses are appreciated. Prostate is approximately 60 grams,  could only palpate the apex and midportion of the gland, no nodules are appreciated. Seminal vesicles could not be palpated Skin: No rashes, bruises or suspicious lesions. Lymph: No inguinal adenopathy. Neurologic: Grossly intact, no focal deficits, moving all 4 extremities. Psychiatric:  Normal mood and affect.   Laboratory Data: See HPI I have reviewed the labs  Pertinent imaging Results for JURON, PETITTE (MRN [id number]) as of 05/27/2019 10:24  Ref. Range 05/27/2019 10:21  Scan Result Unknown 62    Assessment & Plan:    1. BPH with LUTS IPSS score is 11/3, it is worse Continue conservative management, avoiding bladder irritants and timed voiding's Discussed avoiding caffeinated products after lunch Discussed taking the 5 mg Cialis daily that was prescribed to him by his PCP for ED RTC in 6 months for IPSS, PSA and exam   2. History of elevated PSA PSA stable RTC in 6 months for PSA   Return in about 6 months (around 11/27/2019) for IPSS, PSA and exam.  Zara Council, [facility]  [address] [place], [place] [address] [phone]

## 2019-05-30 ENCOUNTER — Telehealth: Payer: Self-pay | Admitting: Family Medicine

## 2019-05-30 NOTE — Telephone Encounter (Signed)
Pt is scheduled °

## 2019-05-30 NOTE — Telephone Encounter (Signed)
Copied from [place] [id number]. Topic: General - Other >> May 16, 2019  4:05 PM Celene Kras wrote: Reason for CRM: Pt called stating that he is having trouble with his CPAP machine. Pt states he would like to get off the machine if possible, but if not then he is requesting to have his machine adjusted. Please advise. >> May 30, 2019 11:51 AM Richardo Priest, NT wrote: Patient called in checking on status of message. Patient Is requesting nurse give him a call today. Please advise and call back is [phone]

## 2019-06-02 DIAGNOSIS — H40021 Open angle with borderline findings, high risk, right eye: Secondary | ICD-10-CM | POA: Diagnosis not present

## 2019-06-04 ENCOUNTER — Ambulatory Visit: Payer: PPO | Attending: Internal Medicine

## 2019-06-04 ENCOUNTER — Encounter: Payer: Self-pay | Admitting: Family Medicine

## 2019-06-04 ENCOUNTER — Other Ambulatory Visit: Payer: Self-pay

## 2019-06-04 ENCOUNTER — Ambulatory Visit (INDEPENDENT_AMBULATORY_CARE_PROVIDER_SITE_OTHER): Payer: PPO | Admitting: Family Medicine

## 2019-06-04 VITALS — BP 130/82 | HR 94 | Temp 97.5°F | Resp 16 | Ht 69.0 in | Wt 193.7 lb

## 2019-06-04 DIAGNOSIS — G4730 Sleep apnea, unspecified: Secondary | ICD-10-CM

## 2019-06-04 DIAGNOSIS — Z23 Encounter for immunization: Secondary | ICD-10-CM

## 2019-06-04 DIAGNOSIS — G3184 Mild cognitive impairment, so stated: Secondary | ICD-10-CM | POA: Diagnosis not present

## 2019-06-04 NOTE — Progress Notes (Signed)
Name: Eric Rose   MRN: [id number]    DOB: 1955/09/09   Date:06/04/2019       Progress Note  Subjective  Chief Complaint  Chief Complaint  Patient presents with  . Sleep Apnea    Want to discuss Cpap machine.    HPI  Sleep Apnea: he has been using a CPAP machine every night. He states that pressure varies , feels good when around 5 , but states that when pressure is higher he feels like there is pressure on his face. He states over the past few nights it was better. He states when pressure is high it feels like too much air blowing through his nostril. He states he has been wearing it every night because he states it will help his mild cognitive impairment.    Patient Active Problem List   Diagnosis Date Noted  . Degeneration of lumbar intervertebral disc 10/02/2018  . Obstructive sleep apnea 04/04/2018  . Hyperlipidemia 04/04/2018  . Lump in neck 02/21/2017  . 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 Memory impairment 02/07/2016  . Erectile dysfunction 08/08/2015  . Anemia 08/06/2015  . Essential hypertension, benign 08/06/2015  . 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 Elevated PSA 01/12/2015  . BPH with obstruction/lower urinary tract symptoms 01/12/2015    Past Surgical History:  Procedure Laterality Date  . JOINT REPLACEMENT Bilateral    hip  . 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   Procedure: TOTAL HIP ARTHROPLASTY;  Surgeon: Earnestine Leys, MD;  Location: [facility] ORS;  Service: Orthopedics;  Laterality: Left;    Family History  Problem Relation Age of Onset  . Diabetes Mother   . Heart disease Mother   . Alzheimer's disease Mother   . Prostate cancer Father   . Prostate cancer Brother   . Prostate cancer Paternal Uncle   . Bladder Cancer Neg Hx   . Kidney cancer Neg Hx     Social History   Tobacco Use  . Smoking status: Never Smoker  . Smokeless tobacco: Never Used  Substance Use Topics  . Alcohol use: No    Alcohol/week: 0.0 standard drinks      Current Outpatient Medications:  .  aspirin EC 81 MG tablet, Take 1 tablet (81 mg total) by mouth daily., Disp: , Rfl:  .  Cyanocobalamin (B-12 PO), Take 1 tablet by mouth daily., Disp: , Rfl:  .  Glucosamine-Chondroit-Vit C-Mn (GLUCOSAMINE 1500 COMPLEX PO), Take by mouth., Disp: , Rfl:  .  lisinopril (ZESTRIL) 20 MG tablet, Take 1 tablet (20 mg total) by mouth daily., Disp: 90 tablet, Rfl: 1 .  memantine (NAMENDA) 5 MG tablet, Take 5 mg by mouth 2 (two) times daily., Disp: , Rfl:  .  Multiple Vitamin (MULTIVITAMIN) capsule, Take 1 capsule by mouth daily., Disp: , Rfl:  .  pravastatin (PRAVACHOL) 20 MG tablet, Take 1 tablet (20 mg total) by mouth at bedtime., Disp: 90 tablet, Rfl: 1 .  tadalafil (CIALIS) 5 MG tablet, TAKE 1 TABLET BY MOUTH EVERY DAY FOR ERECTILE DYSFUNCTION., Disp: 30 tablet, Rfl: 2 .  VITAMIN D, ERGOCALCIFEROL, PO, Take 1,000 mg by mouth daily. , Disp: , Rfl:  .  VITAMIN E PO, Take 1 capsule by mouth daily., Disp: , Rfl:   No Known Allergies  I personally reviewed active problem list, medication list, allergies, family history, social history, health maintenance with the patient/caregiver today.   ROS  Constitutional: Patient appears well-developed and well-nourished.  No distress.  HEENT: head atraumatic,  normocephalic, pupils equal and reactive to light Cardiovascular: Normal rate, regular rhythm and normal heart sounds.  No murmur heard. No BLE edema. Pulmonary/Chest: Effort normal and breath sounds normal. No respiratory distress. Abdominal: Soft.  There is no tenderness. Psychiatric: Patient has a normal mood and affect. behavior is normal. Judgment and thought content normal.  Objective  Vitals:   06/04/19 1408  BP: 130/82  Pulse: 94  Resp: 16  Temp: (!) 97.5 F (36.4 C)  TempSrc: Temporal  SpO2: 96%  Weight: 193 lb 11.2 oz (87.9 kg)  Height: 5\' 9"  (1.753 m)    Body mass index is 28.6 kg/m.  Physical Exam  Constitutional: Patient appears  well-developed and well-nourished.No distress.  HEENT: head atraumatic, normocephalic, pupils equal and reactive to light Cardiovascular: Normal rate, regular rhythm and normal heart sounds.  No murmur heard. No BLE edema. Pulmonary/Chest: Effort normal and breath sounds normal. No respiratory distress. Abdominal: Soft.  There is no tenderness. Psychiatric: Patient has a normal mood and affect. behavior is normal. Judgment and thought content normal.  Recent Results (from the past 2160 hour(s))  PSA     Status: None   Collection Time: 03/19/19  1:54 PM  Result Value Ref Range   Prostate Specific Ag, Serum 2.4 0.0 - 4.0 ng/mL    Comment: Roche ECLIA methodology. According to the American Urological Association, Serum PSA should decrease and remain at undetectable levels after radical prostatectomy. The AUA defines biochemical recurrence as an initial PSA value 0.2 ng/mL or greater followed by a subsequent confirmatory PSA value 0.2 ng/mL or greater. Values obtained with different assay methods or kits cannot be used interchangeably. Results cannot be interpreted as absolute evidence of the presence or absence of malignant disease.   COMPLETE METABOLIC PANEL WITH GFR     Status: None   Collection Time: 04/07/19 11:43 AM  Result Value Ref Range   Glucose, Bld 69 65 - 99 mg/dL    Comment: .            Fasting reference interval .    BUN 10 7 - 25 mg/dL   Creat 0.90 0.70 - 1.25 mg/dL    Comment: For patients >94 years of age, the reference limit for Creatinine is approximately 13% higher for people identified as African-American. .    GFR, Est Non African American 89 > OR = 60 mL/min/1.71m2   GFR, Est African American 104 > OR = 60 mL/min/1.63m2   BUN/Creatinine Ratio NOT APPLICABLE 6 - 22 (calc)   Sodium 138 135 - 146 mmol/L   Potassium 4.5 3.5 - 5.3 mmol/L   Chloride 104 98 - 110 mmol/L   CO2 30 20 - 32 mmol/L   Calcium 9.2 8.6 - 10.3 mg/dL   Total Protein 6.4 6.1 - 8.1 g/dL    Albumin 3.9 3.6 - 5.1 g/dL   Globulin 2.5 1.9 - 3.7 g/dL (calc)   AG Ratio 1.6 1.0 - 2.5 (calc)   Total Bilirubin 0.4 0.2 - 1.2 mg/dL   Alkaline phosphatase (APISO) 67 35 - 144 U/L   AST 28 10 - 35 U/L   ALT 23 9 - 46 U/L  PSA     Status: None   Collection Time: 05/23/19 10:10 AM  Result Value Ref Range   Prostate Specific Ag, Serum 2.7 0.0 - 4.0 ng/mL    Comment: Roche ECLIA methodology. According to the American Urological Association, Serum PSA should decrease and remain at undetectable levels after radical prostatectomy. The AUA defines  biochemical recurrence as an initial PSA value 0.2 ng/mL or greater followed by a subsequent confirmatory PSA value 0.2 ng/mL or greater. Values obtained with different assay methods or kits cannot be used interchangeably. Results cannot be interpreted as absolute evidence of the presence or absence of malignant disease.   Bladder Scan (Post Void Residual) in office     Status: None   Collection Time: 05/27/19 10:21 AM  Result Value Ref Range   Scan Result 62       PHQ2/9: Depression screen [facility] 2/9 04/07/2019 10/02/2018 04/04/2018 03/12/2018 02/21/2017  Decreased Interest 0 0 0 0 0  Down, Depressed, Hopeless 0 0 0 0 0  PHQ - 2 Score 0 0 0 0 0  Altered sleeping 0 0 0 0 -  Tired, decreased energy 0 0 0 0 -  Change in appetite 0 0 0 0 -  Feeling bad or failure about yourself  0 0 0 0 -  Trouble concentrating 0 0 0 0 -  Moving slowly or fidgety/restless 0 0 0 0 -  Suicidal thoughts 0 0 0 0 -  PHQ-9 Score 0 0 0 0 -  Difficult doing work/chores Not difficult at all Not difficult at all Not difficult at all Not difficult at all -    phq 9 is negative   Fall Risk: Fall Risk  06/04/2019 04/07/2019 10/02/2018 04/04/2018 03/12/2018  Falls in the past year? 0 0 0 0 0  Number falls in past yr: 0 0 0 0 0  Injury with Fall? 0 0 0 0 0  Follow up - Falls evaluation completed - - -     Functional Status Survey: Is the patient deaf or have  difficulty hearing?: No Does the patient have difficulty seeing, even when wearing glasses/contacts?: No Does the patient have difficulty concentrating, remembering, or making decisions?: Yes Does the patient have difficulty walking or climbing stairs?: No Does the patient have difficulty dressing or bathing?: No Does the patient have difficulty doing errands alone such as visiting a doctor's office or shopping?: No    Assessment & Plan  1. Sleep apnea in adult  His pressure auto pap 5-20 cm H2O, he states he prefers the lower pressure, I will contact [name] for him and see if the pressure can be adjusted to increase compliance   2. Mild cognitive impairment  Keep follow up with neurologist

## 2019-06-04 NOTE — Progress Notes (Signed)
   Covid-19 Vaccination Clinic  Name:  Eric Rose    MRN: [id number] DOB: 07/18/1955  06/04/2019  [name] was observed post Covid-19 immunization for 15 minutes without incident. He was provided with Vaccine Information Sheet and instruction to access the V-Safe system.   [name] was instructed to call 911 with any severe reactions post vaccine: Marland Kitchen Difficulty breathing  . Swelling of face and throat  . A fast heartbeat  . A bad rash all over body  . Dizziness and weakness   Immunizations Administered    Name Date Dose VIS Date Route   Pfizer COVID-19 Vaccine 06/04/2019  9:01 AM 0.3 mL 02/21/2019 Intramuscular   Manufacturer: [employer], [employer]   Lot: [id number]   Power: [id number]

## 2019-06-05 ENCOUNTER — Ambulatory Visit: Payer: Self-pay

## 2019-06-24 ENCOUNTER — Telehealth: Payer: Self-pay | Admitting: Family Medicine

## 2019-06-24 NOTE — Telephone Encounter (Signed)
Patient requesting tadalafil (CIALIS) 5 MG tablet, informed please allow 48 to 72 hour turn around time, patient requesting if medication can be expedited   [place] [place] [address]  [place] [address]  Phone: [phone] Fax: [phone]  Not a 24 hour pharmacy; exact hours not known.

## 2019-06-25 ENCOUNTER — Other Ambulatory Visit: Payer: Self-pay

## 2019-06-25 ENCOUNTER — Ambulatory Visit: Payer: PPO | Admitting: Podiatry

## 2019-06-25 ENCOUNTER — Encounter: Payer: Self-pay | Admitting: Podiatry

## 2019-06-25 DIAGNOSIS — Q828 Other specified congenital malformations of skin: Secondary | ICD-10-CM

## 2019-06-25 NOTE — Progress Notes (Signed)
Subjective:  Patient ID: Eric Rose, male    DOB: 27-May-1955,  MRN: [id number] HPI Chief Complaint  Patient presents with  . Callouses    Patient presents today for painful callous lesions bilat feet x couple of years.  He states "it feel like Im walking on rocks sometimes and has gotten worse recently"  He soaks in Epson salt and has used corn pads for relief    64 y.o. male presents with the above complaint.   ROS: Denies fever chills nausea vomiting muscle aches pains calf pain back pain chest pain shortness of breath.  Past Medical History:  Diagnosis Date  . Allergy   . Anxiety   . BPH (benign prostatic hypertrophy) with urinary obstruction    followed by urologist  . Decreased libido   . Dementia ([place])   . Erectile dysfunction 08/08/2015  . GERD (gastroesophageal reflux disease)   . History of pneumonia   . Hypertension   . Lumbago   . 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 JOINT REPLACEMENT Bilateral    hip  . 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   Procedure: TOTAL HIP ARTHROPLASTY;  Surgeon: Earnestine Leys, MD;  Location: [facility] ORS;  Service: Orthopedics;  Laterality: Left;    Current Outpatient Medications:  .  amoxicillin (AMOXIL) 500 MG capsule, TAKE FOUR CAPSULES BY MOUTH ONE HOUR BEFORE APPOINTMENT, Disp: , Rfl:  .  aspirin EC 81 MG tablet, Take 1 tablet (81 mg total) by mouth daily., Disp: , Rfl:  .  Cyanocobalamin (B-12 PO), Take 1 tablet by mouth daily., Disp: , Rfl:  .  Glucosamine-Chondroit-Vit C-Mn (GLUCOSAMINE 1500 COMPLEX PO), Take by mouth., Disp: , Rfl:  .  lisinopril (ZESTRIL) 20 MG tablet, Take 1 tablet (20 mg total) by mouth daily., Disp: 90 tablet, Rfl: 1 .  memantine (NAMENDA) 5 MG tablet, Take 5 mg by mouth 2 (two) times daily., Disp: , Rfl:  .  Multiple Vitamin (MULTIVITAMIN) capsule, Take 1 capsule by mouth daily., Disp: ,  Rfl:  .  pravastatin (PRAVACHOL) 20 MG tablet, Take 1 tablet (20 mg total) by mouth at bedtime., Disp: 90 tablet, Rfl: 1 .  tadalafil (CIALIS) 5 MG tablet, TAKE 1 TABLET BY MOUTH EVERY DAY FOR ERECTILE DYSFUNCTION., Disp: 30 tablet, Rfl: 2 .  VITAMIN D, ERGOCALCIFEROL, PO, Take 1,000 mg by mouth daily. , Disp: , Rfl:  .  VITAMIN E PO, Take 1 capsule by mouth daily., Disp: , Rfl:   No Known Allergies Review of Systems Objective:  There were no vitals filed for this visit.  General: Well developed, nourished, in no acute distress, alert and oriented x3   Dermatological: Skin is warm, dry and supple bilateral. Nails x 10 are well maintained; remaining integument appears unremarkable at this time. There are no open sores, no preulcerative lesions, no rash or signs of infection present.  Solitary porokeratotic lesion plantar lateral aspect of the fifth metatarsal left foot plantar central calcaneus right foot and lateral metatarsal head fifth right.  Vascular: Dorsalis Pedis artery and Posterior Tibial artery pedal pulses are 2/4 bilateral with immedate capillary fill time. Pedal hair growth present. No varicosities and no lower extremity edema present bilateral.   Neruologic: Grossly intact via light touch bilateral. Vibratory intact via tuning fork bilateral. Protective threshold with Semmes Wienstein monofilament intact to all pedal sites bilateral. Patellar and Achilles deep tendon  reflexes 2+ bilateral. No Babinski or clonus noted bilateral.   Musculoskeletal: No gross boney pedal deformities bilateral. No pain, crepitus, or limitation noted with foot and ankle range of motion bilateral. Muscular strength 5/5 in all groups tested bilateral.  Gait: Unassisted, Nonantalgic.    Radiographs:  None taken  Assessment & Plan:   Assessment: Solitary poor keratomas plantar aspect of the bilateral foot  Plan: Mechanical debridement with chemical destruction of the lesion salicylic acid under  occlusion to be left on for 3 days before getting wet.  He is to wash this off thoroughly follow-up with me in 6 weeks if necessary.     Landon Truax T. [place], [place]

## 2019-07-24 DIAGNOSIS — Z96641 Presence of right artificial hip joint: Secondary | ICD-10-CM | POA: Diagnosis not present

## 2019-07-24 DIAGNOSIS — M1612 Unilateral primary osteoarthritis, left hip: Secondary | ICD-10-CM | POA: Diagnosis not present

## 2019-08-04 DIAGNOSIS — F5104 Psychophysiologic insomnia: Secondary | ICD-10-CM | POA: Diagnosis not present

## 2019-08-04 DIAGNOSIS — R4189 Other symptoms and signs involving cognitive functions and awareness: Secondary | ICD-10-CM | POA: Diagnosis not present

## 2019-08-04 DIAGNOSIS — G4733 Obstructive sleep apnea (adult) (pediatric): Secondary | ICD-10-CM | POA: Diagnosis not present

## 2019-08-06 ENCOUNTER — Ambulatory Visit: Payer: PPO | Admitting: Internal Medicine

## 2019-08-06 ENCOUNTER — Other Ambulatory Visit: Payer: Self-pay

## 2019-08-06 ENCOUNTER — Telehealth: Payer: Self-pay | Admitting: Emergency Medicine

## 2019-08-06 NOTE — Telephone Encounter (Signed)
Would like a refill on Cialis but would like it increased to 10mg  tablets instead. [place]

## 2019-08-06 NOTE — Telephone Encounter (Signed)
Patient is followed by urology, with his last visit in March 2021. Would prefer that he call their office to discuss the medication request and potential increase in dosage. I have not had any interactions with the patient to date. Thanks, [facility]

## 2019-08-07 NOTE — Telephone Encounter (Signed)
Patient notified

## 2019-08-13 ENCOUNTER — Ambulatory Visit (INDEPENDENT_AMBULATORY_CARE_PROVIDER_SITE_OTHER): Payer: PPO | Admitting: Internal Medicine

## 2019-08-13 ENCOUNTER — Other Ambulatory Visit: Payer: Self-pay

## 2019-08-13 ENCOUNTER — Encounter: Payer: Self-pay | Admitting: Internal Medicine

## 2019-08-13 VITALS — BP 130/76 | HR 91 | Temp 97.9°F | Resp 16 | Ht 71.0 in | Wt 196.3 lb

## 2019-08-13 DIAGNOSIS — I10 Essential (primary) hypertension: Secondary | ICD-10-CM | POA: Diagnosis not present

## 2019-08-13 DIAGNOSIS — N529 Male erectile dysfunction, unspecified: Secondary | ICD-10-CM

## 2019-08-13 MED ORDER — TADALAFIL 10 MG PO TABS: ORAL_TABLET | ORAL | 2 refills | Status: DC

## 2019-08-13 NOTE — Progress Notes (Signed)
Patient ID: [name], male    DOB: [birth year], 64 y.o.   MRN: [id number]  PCP: Eric Malkin, MD  Chief Complaint  Patient presents with  . Erectile Dysfunction    Subjective:   Eric Rose is a 64 y.o. male, presents to clinic with CC of the following:  Chief Complaint  Patient presents with  . Erectile Dysfunction    HPI:  Patient is a 64 year old male patient of Eric Rose Follows up today to discuss options for erectile dysfunction. He is followed by urology, with his last visit in March 2021 with them. His PSA was noted to be stable at that time and a follow-up planned in 6 months.  He notes he is currently using the Cialis product-5 mg, and does not take it every day, and often takes it before planned sexual activities.  He states it is helpful, although he was wondering if the 10 mg dose might be more helpful with maintaining erections.  He denies any side effect concerns with this medicine.  All in all, he otherwise has felt well, is continuing to take his medicines for blood pressure, cholesterol, and recently saw neurology with an additional medicine added for mild cognitive impairment (Aricept).  He continues taking the Namenda product as well.  He also continues to use the CPAP device for his sleep apnea.    Patient Active Problem List   Diagnosis Date Noted  . Degeneration of lumbar intervertebral disc 10/02/2018  . Obstructive sleep apnea 04/04/2018  . Hyperlipidemia 04/04/2018  . Lump in neck 02/21/2017  . 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 Memory impairment 02/07/2016  . Erectile dysfunction 08/08/2015  . Anemia 08/06/2015  . Essential hypertension, benign 08/06/2015  . 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 Elevated PSA 01/12/2015  . BPH with obstruction/lower urinary tract symptoms 01/12/2015      Current Outpatient Medications:  .  aspirin EC 81 MG tablet, Take 1 tablet (81 mg total) by mouth daily., Disp: , Rfl:   .  Cyanocobalamin (B-12 PO), Take 1 tablet by mouth daily., Disp: , Rfl:  .  Glucosamine-Chondroit-Vit C-Mn (GLUCOSAMINE 1500 COMPLEX PO), Take by mouth., Disp: , Rfl:  .  lisinopril (ZESTRIL) 20 MG tablet, Take 1 tablet (20 mg total) by mouth daily., Disp: 90 tablet, Rfl: 1 .  meloxicam (MOBIC) 15 MG tablet, Take 15 mg by mouth daily., Disp: , Rfl:  .  memantine (NAMENDA) 5 MG tablet, Take 5 mg by mouth 2 (two) times daily., Disp: , Rfl:  .  Multiple Vitamin (MULTIVITAMIN) capsule, Take 1 capsule by mouth daily., Disp: , Rfl:  .  pravastatin (PRAVACHOL) 20 MG tablet, Take 1 tablet (20 mg total) by mouth at bedtime., Disp: 90 tablet, Rfl: 1 .  tadalafil (CIALIS) 5 MG tablet, TAKE 1 TABLET BY MOUTH EVERY DAY FOR ERECTILE DYSFUNCTION., Disp: 30 tablet, Rfl: 2 .  VITAMIN D, ERGOCALCIFEROL, PO, Take 1,000 mg by mouth daily. , Disp: , Rfl:  .  VITAMIN E PO, Take 1 capsule by mouth daily., Disp: , Rfl:  .  amoxicillin (AMOXIL) 500 MG capsule, TAKE FOUR CAPSULES BY MOUTH ONE HOUR BEFORE APPOINTMENT, Disp: , Rfl:  .  donepezil (ARICEPT) 5 MG tablet, Take 5 mg by mouth at bedtime., Disp: , Rfl:    No Known Allergies   Past Surgical History:  Procedure Laterality Date  . JOINT REPLACEMENT Bilateral    hip  . 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  Procedure: TOTAL HIP ARTHROPLASTY;  Surgeon: Earnestine Leys, MD;  Location: [facility] ORS;  Service: Orthopedics;  Laterality: Left;     Family History  Problem Relation Age of Onset  . Diabetes Mother   . Heart disease Mother   . Alzheimer's disease Mother   . Prostate cancer Father   . Prostate cancer Brother   . Prostate cancer Paternal Uncle   . Bladder Cancer Neg Hx   . Kidney cancer Neg Hx      Social History   Tobacco Use  . Smoking status: Never Smoker  . Smokeless tobacco: Never Used  Substance Use Topics  . Alcohol use: No    Alcohol/week: 0.0 standard drinks    With staff assistance, above reviewed with the patient  today.  ROS: As per HPI, otherwise no specific complaints on a limited and focused system review   No results found for this or any previous visit (from the past 72 hour(s)).   PHQ2/9: Depression screen [facility] 2/9 08/13/2019 04/07/2019 10/02/2018 04/04/2018 03/12/2018  Decreased Interest 0 0 0 0 0  Down, Depressed, Hopeless 0 0 0 0 0  PHQ - 2 Score 0 0 0 0 0  Altered sleeping 0 0 0 0 0  Tired, decreased energy 0 0 0 0 0  Change in appetite 0 0 0 0 0  Feeling bad or failure about yourself  0 0 0 0 0  Trouble concentrating 0 0 0 0 0  Moving slowly or fidgety/restless 0 0 0 0 0  Suicidal thoughts 0 0 0 0 0  PHQ-9 Score 0 0 0 0 0  Difficult doing work/chores Not difficult at all Not difficult at all Not difficult at all Not difficult at all Not difficult at all   PHQ-2/9 Result is neg  Fall Risk: Fall Risk  08/13/2019 06/04/2019 04/07/2019 10/02/2018 04/04/2018  Falls in the past year? 0 0 0 0 0  Number falls in past yr: 0 0 0 0 0  Injury with Fall? 0 0 0 0 0  Follow up - - Falls evaluation completed - -      Objective:   Vitals:   08/13/19 1456  BP: 130/76  Pulse: 91  Resp: 16  Temp: 97.9 F (36.6 C)  TempSrc: Temporal  SpO2: 99%  Weight: 196 lb 4.8 oz (89 kg)  Height: 5\' 11"  (1.803 m)    Body mass index is 27.38 kg/m.  Physical Exam   NAD, masked, pleasant HEENT - [place]/AT, sclera anicteric,  Neck - supple,  Car - RRR without m/g/r Pulm- RR and effort normal at rest, CTA without wheeze or rales Abd - soft, NT diffusely,  Ext - no LE edema,  Neuro/psychiatric - affect was not flat, appropriate with conversation  Alert with speech normal  Results for orders placed or performed in visit on 05/27/19  Bladder Scan (Post Void Residual) in office  Result Value Ref Range   Scan Result 62        Assessment & Plan:    1. Erectile dysfunction, unspecified erectile dysfunction type He notes he has been helped with the tadalafil product, currently taking 5 mg.  The inquires  about trying the 10 mg dose to see if that will be more helpful, and I did discuss utilizing the lowest dose of this medicine possible to get the desired effect, as there are potential side effects of the medicine noted.  He very much wanted to try the 10 mg dose, and felt that was reasonable.  He had not been taking the 5 mg dose daily, and recommended he take the 10 mg dose in the same way that he was taking the 5 mg dose. He had some confusion with the medicine having to names, one the trade name and one the generic, and I told him they are not the same medicine, with often the trade name product having a higher cost. He did agree to have the generic tadalafil product after our discussion. - tadalafil (CIALIS) 10 MG tablet; TAKE 1 TABLET BY MOUTH EVERY DAY FOR ERECTILE DYSFUNCTION.  Dispense: 30 tablet; Refill: 2  2. Essential hypertension, benign Blood pressure remains good on check today.  Raquel Sarna noted on her last visit in January 2021 having a follow-up in 6 months time, and he should continue with that plan.  Can follow-up sooner as needed.       Eric Malkin, MD 08/13/19 3:16 PM

## 2019-10-06 ENCOUNTER — Ambulatory Visit: Payer: PPO | Admitting: Family Medicine

## 2019-10-09 ENCOUNTER — Telehealth: Payer: Self-pay

## 2019-10-09 ENCOUNTER — Other Ambulatory Visit: Payer: Self-pay | Admitting: Internal Medicine

## 2019-10-09 DIAGNOSIS — I10 Essential (primary) hypertension: Secondary | ICD-10-CM

## 2019-10-09 MED ORDER — LISINOPRIL 20 MG PO TABS: 20.0000 mg | ORAL_TABLET | Freq: Every day | ORAL | 1 refills | Status: DC

## 2019-10-09 NOTE — Telephone Encounter (Signed)
Requested Prescriptions  Pending Prescriptions Disp Refills   lisinopril (ZESTRIL) 20 MG tablet 90 tablet 1    Sig: Take 1 tablet (20 mg total) by mouth daily.     Cardiovascular:  ACE Inhibitors Failed - 10/09/2019 11:40 AM      Failed - Cr in normal range and within 180 days    Creat  Date Value Ref Range Status  04/07/2019 0.90 0.70 - 1.25 mg/dL Final    Comment:    For patients >66 years of age, the reference limit for Creatinine is approximately 13% higher for people identified as African-American. .          Failed - K in normal range and within 180 days    Potassium  Date Value Ref Range Status  04/07/2019 4.5 3.5 - 5.3 mmol/L Final  12/18/2013 4.2 3.5 - 5.1 mmol/L Final         Passed - Patient is not pregnant      Passed - Last BP in normal range    BP Readings from Last 1 Encounters:  08/13/19 130/76         Passed - Valid encounter within last 6 months    Recent Outpatient Visits          1 month ago Erectile dysfunction, unspecified erectile dysfunction type   [place], MD   4 months ago Sleep apnea in adult   [facility] Steele Sizer, MD   6 months ago Essential hypertension, benign   [place], FNP   1 year ago Essential hypertension, benign   [place], NP   1 year ago Nodule of neck   [place], Satira Anis, MD      Future Appointments            In 1 month [place], Shannon A, [place]            Seen in office 08/2019; to f/u in 6 mos.  Refilled per protocol.

## 2019-10-09 NOTE — Telephone Encounter (Signed)
Error

## 2019-10-09 NOTE — Telephone Encounter (Signed)
Patient's wife informed per Dr. Roxan Hockey that he can take mobic once daily. They are going to schedule an appointment to be evaluated in clinic.   Copied from [place] [phone]. Topic: General - Inquiry >> Oct 08, 2019  5:08 PM Eric Rose wrote: Reason for CRM: pt called in and stated that he would take tylenol for his hip pain but it is not really helping and would like to know if he could take the meloxicam (MOBIC) 15 MG tablet [[id number]] Best number - [phone]

## 2019-10-09 NOTE — Telephone Encounter (Signed)
Yes, it is ok to try taking the mobic once daily, take with food and assess his response. Thanks, [facility]

## 2019-10-09 NOTE — Telephone Encounter (Signed)
rx refill lisinopril (ZESTRIL) 20 MG tablet [[id number]]  [place], [place] Phone:  [phone]  Fax:  [phone]      Patient has no more medication Patient is wanting medication today

## 2019-10-10 ENCOUNTER — Ambulatory Visit (INDEPENDENT_AMBULATORY_CARE_PROVIDER_SITE_OTHER): Payer: PPO | Admitting: Internal Medicine

## 2019-10-10 ENCOUNTER — Encounter: Payer: Self-pay | Admitting: Internal Medicine

## 2019-10-10 ENCOUNTER — Other Ambulatory Visit: Payer: Self-pay

## 2019-10-10 VITALS — BP 130/80 | HR 96 | Temp 97.8°F | Resp 16 | Ht 71.0 in | Wt 195.5 lb

## 2019-10-10 DIAGNOSIS — Z96643 Presence of artificial hip joint, bilateral: Secondary | ICD-10-CM | POA: Diagnosis not present

## 2019-10-10 DIAGNOSIS — M7061 Trochanteric bursitis, right hip: Secondary | ICD-10-CM | POA: Diagnosis not present

## 2019-10-10 DIAGNOSIS — M25551 Pain in right hip: Secondary | ICD-10-CM

## 2019-10-10 NOTE — Progress Notes (Signed)
Patient ID: Eric Rose, male    DOB: 1955-10-04, 64 y.o.   MRN: [id number]  PCP: Towanda Malkin, MD  Chief Complaint  Patient presents with  . Hip Pain    right hip pain, off and on, started a couple of months ago    Subjective:   Eric Rose is a 64 y.o. male, presents to clinic with CC of the following:  Chief Complaint  Patient presents with  . Hip Pain    right hip pain, off and on, started a couple of months ago    HPI:  Patient is a 64 year old male My last visit with him was 08/13/2019. Follows up today with increasing right hip pain. His wife was present with him today.  In 2017, he had a total hip arthroplasty done, and the patient noted he has followed up with that doctor in the recent past due to these type of symptoms, likely 07/24/2019 for an encounter noted in epic although the details of that encounter not able to be seen.  Patient noted he mentioned the term bursitis, recommended meloxicam to take daily, and did do x-rays as part of that evaluation (no x-rays were noted on the imaging in epic).  He notes the right hip has continued to feel stiff and achy, off and on, mostly feels it on the lateral side of the hip.  Occasionally can feel anteriorly.  Usually is not bothersome at rest, although sometimes can be, and sometimes bothersome enough that he is limping around to compensate.  It is not particularly problematic today. No recent trauma noted.   He denies any pain that radiates down the leg, no numbness or tingling in the leg, no weakness distally in the leg.  Denies any bladder symptoms with no dysuria.  No abdominal pains.  Patient Active Problem List   Diagnosis Date Noted  . Degeneration of lumbar intervertebral disc 10/02/2018  . Obstructive sleep apnea 04/04/2018  . Hyperlipidemia 04/04/2018  . Lump in neck 02/21/2017  . 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 Memory impairment 02/07/2016  . Erectile dysfunction 08/08/2015  .  Anemia 08/06/2015  . Essential hypertension, benign 08/06/2015  . 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 Elevated PSA 01/12/2015  . BPH with obstruction/lower urinary tract symptoms 01/12/2015      Current Outpatient Medications:  .  amoxicillin (AMOXIL) 500 MG capsule, TAKE FOUR CAPSULES BY MOUTH ONE HOUR BEFORE APPOINTMENT, Disp: , Rfl:  .  aspirin EC 81 MG tablet, Take 1 tablet (81 mg total) by mouth daily., Disp: , Rfl:  .  Cyanocobalamin (B-12 PO), Take 1 tablet by mouth daily., Disp: , Rfl:  .  donepezil (ARICEPT) 5 MG tablet, Take 5 mg by mouth at bedtime., Disp: , Rfl:  .  Glucosamine-Chondroit-Vit C-Mn (GLUCOSAMINE 1500 COMPLEX PO), Take by mouth., Disp: , Rfl:  .  lisinopril (ZESTRIL) 20 MG tablet, Take 1 tablet (20 mg total) by mouth daily., Disp: 90 tablet, Rfl: 1 .  meloxicam (MOBIC) 15 MG tablet, Take 15 mg by mouth daily., Disp: , Rfl:  .  memantine (NAMENDA) 5 MG tablet, Take 5 mg by mouth 2 (two) times daily., Disp: , Rfl:  .  Multiple Vitamin (MULTIVITAMIN) capsule, Take 1 capsule by mouth daily., Disp: , Rfl:  .  pravastatin (PRAVACHOL) 20 MG tablet, Take 1 tablet (20 mg total) by mouth at bedtime., Disp: 90 tablet, Rfl: 1 .  tadalafil (CIALIS) 10 MG tablet, TAKE 1 TABLET BY  MOUTH EVERY DAY FOR ERECTILE DYSFUNCTION., Disp: 30 tablet, Rfl: 2 .  VITAMIN D, ERGOCALCIFEROL, PO, Take 1,000 mg by mouth daily. , Disp: , Rfl:  .  VITAMIN E PO, Take 1 capsule by mouth daily., Disp: , Rfl:    No Known Allergies   Past Surgical History:  Procedure Laterality Date  . JOINT REPLACEMENT Bilateral    hip  . 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   Procedure: TOTAL HIP ARTHROPLASTY;  Surgeon: Earnestine Leys, MD;  Location: [facility] ORS;  Service: Orthopedics;  Laterality: Left;     Family History  Problem Relation Age of Onset  . Diabetes Mother   . Heart disease Mother   . Alzheimer's disease Mother   . Prostate cancer Father   . Prostate cancer Brother   . Prostate cancer  Paternal Uncle   . Bladder Cancer Neg Hx   . Kidney cancer Neg Hx      Social History   Tobacco Use  . Smoking status: Never Smoker  . Smokeless tobacco: Never Used  Substance Use Topics  . Alcohol use: No    Alcohol/week: 0.0 standard drinks    With staff assistance, above reviewed with the patient today.  ROS: As per HPI, otherwise no specific complaints on a limited and focused system review   No results found for this or any previous visit (from the past 72 hour(s)).   PHQ2/9: Depression screen [facility] 2/9 10/10/2019 08/13/2019 04/07/2019 10/02/2018 04/04/2018  Decreased Interest 0 0 0 0 0  Down, Depressed, Hopeless 0 0 0 0 0  PHQ - 2 Score 0 0 0 0 0  Altered sleeping 1 0 0 0 0  Tired, decreased energy 0 0 0 0 0  Change in appetite 0 0 0 0 0  Feeling bad or failure about yourself  0 0 0 0 0  Trouble concentrating 0 0 0 0 0  Moving slowly or fidgety/restless 0 0 0 0 0  Suicidal thoughts 0 0 0 0 0  PHQ-9 Score 1 0 0 0 0  Difficult doing work/chores Not difficult at all Not difficult at all Not difficult at all Not difficult at all Not difficult at all   PHQ-2/9 Result is neg  Fall Risk: Fall Risk  10/10/2019 08/13/2019 06/04/2019 04/07/2019 10/02/2018  Falls in the past year? - 0 0 0 0  Number falls in past yr: 0 0 0 0 0  Injury with Fall? 0 0 0 0 0  Follow up - - - Falls evaluation completed -      Objective:   Vitals:   10/10/19 1031  BP: (!) 130/80  Pulse: 96  Resp: 16  Temp: 97.8 F (36.6 C)  TempSrc: Temporal  SpO2: 99%  Weight: 195 lb 8 oz (88.7 kg)  Height: 5\' 11"  (1.803 m)    Body mass index is 27.27 kg/m.  Physical Exam   NAD, masked, pleasant, looks well HEENT - [place]/AT, sclera anicteric Abd - soft, NT in right lower quadrant region,  Ext - R Hip - ROM:   No pain with hip flexion,   Mild discomfort with hip abduction, no marked pain with adduction, and not increased with resistance testing   No pain with internal rotation, mild with external  rotation  Not markedly tender with palpation at the hip joint proper, minimal discomfort at best noted  Positive scar from the surgery evident,  NT at trochanteric bursa region with palpation, slightly tender more superior to the trochanteric bursa, and he  notes this is where he feels his discomfort often when it occurs and can be more diffuse.  SLR neg and no pain when testing  Sensation intact to LT in LE, good strength in the lower extremity including with dorsi and plantar flexion of the feet. Neuro/psychiatric - affect was not flat, appropriate with conversation  Alert    Results for orders placed or performed in visit on 05/27/19  Bladder Scan (Post Void Residual) in office  Result Value Ref Range   Scan Result 62        Assessment & Plan:   1. Hip pain, right 2. Trochanteric bursitis, right hip 3. Status post total replacement of both hips Patient not very symptomatic on assessment today, and notes his symptoms are more intermittent, although by history, do feel he has an element of trochanteric bursitis intermittently.  Noted after his total hip procedure, do feel follow-up with the orthopedist is important when having symptoms recur, and he did follow-up 1 time in May.  Recommended again contacting the office to follow-up with them if symptoms are continuing, as do feel their input would be helpful given his prior hip arthroplasty. Can use the meloxicam product once daily, and not take other NSAIDs when using the meloxicam. He can use a Tylenol product later in the day as needed. Also recommend applying cold to the area (like a bag of peas or corn from his freezer) as needed as can help with inflammation and discomfort, and can do at nighttime fairly routinely to help as well. He does have the number to call his surgeon for follow-up, and noted if he needs a referral, let us know and would be happy to provide that.       Towanda Malkin, MD 10/10/19 10:47 AM

## 2019-10-10 NOTE — Patient Instructions (Signed)
Can take the meloxicam product once daily, take with food.   Also apply cold topically at 10 to 15-minute intervals to help lessen inflammation.  Would follow-up with the orthopedic doctor if symptoms persisting or more problematic as we discussed today.

## 2019-10-27 ENCOUNTER — Other Ambulatory Visit: Payer: Self-pay

## 2019-10-27 DIAGNOSIS — E782 Mixed hyperlipidemia: Secondary | ICD-10-CM

## 2019-10-27 MED ORDER — PRAVASTATIN SODIUM 20 MG PO TABS: 20.0000 mg | ORAL_TABLET | Freq: Every day | ORAL | 3 refills | Status: DC

## 2019-11-17 DIAGNOSIS — M9701XA Periprosthetic fracture around internal prosthetic right hip joint, initial encounter: Secondary | ICD-10-CM | POA: Diagnosis not present

## 2019-11-21 ENCOUNTER — Other Ambulatory Visit: Payer: PPO

## 2019-11-21 ENCOUNTER — Encounter: Payer: Self-pay | Admitting: Urology

## 2019-11-21 DIAGNOSIS — M9701XD Periprosthetic fracture around internal prosthetic right hip joint, subsequent encounter: Secondary | ICD-10-CM | POA: Diagnosis not present

## 2019-11-24 ENCOUNTER — Other Ambulatory Visit: Payer: Self-pay

## 2019-11-24 ENCOUNTER — Other Ambulatory Visit
Admission: RE | Admit: 2019-11-24 | Discharge: 2019-11-24 | Disposition: A | Discharge status: Home / Self Care | Payer: PPO | Source: Ambulatory Visit | Attending: Urology | Admitting: Urology

## 2019-11-24 DIAGNOSIS — Z20822 Contact with and (suspected) exposure to covid-19: Secondary | ICD-10-CM | POA: Insufficient documentation

## 2019-11-24 DIAGNOSIS — Z01812 Encounter for preprocedural laboratory examination: Secondary | ICD-10-CM | POA: Insufficient documentation

## 2019-11-25 ENCOUNTER — Other Ambulatory Visit: Payer: PPO

## 2019-11-25 LAB — SARS CORONAVIRUS 2 (TAT 6-24 HRS): SARS Coronavirus 2: NEGATIVE

## 2019-11-26 ENCOUNTER — Inpatient Hospital Stay
Admission: RE | Admit: 2019-11-26 | Discharge: 2019-11-29 | DRG: 481 | Disposition: A | Discharge status: Home Health | Payer: PPO | Source: Ambulatory Visit | Attending: Orthopedic Surgery | Admitting: Orthopedic Surgery

## 2019-11-26 ENCOUNTER — Encounter: Payer: Self-pay | Admitting: Orthopedic Surgery

## 2019-11-26 ENCOUNTER — Ambulatory Visit: Payer: PPO | Admitting: Anesthesiology

## 2019-11-26 ENCOUNTER — Encounter
Admission: RE | Disposition: A | Discharge status: Home / Self Care | Payer: Self-pay | Source: Home / Self Care | Attending: Orthopedic Surgery

## 2019-11-26 ENCOUNTER — Other Ambulatory Visit: Payer: Self-pay

## 2019-11-26 ENCOUNTER — Ambulatory Visit: Payer: PPO

## 2019-11-26 ENCOUNTER — Other Ambulatory Visit: Payer: Self-pay | Admitting: Orthopedic Surgery

## 2019-11-26 DIAGNOSIS — Z20822 Contact with and (suspected) exposure to covid-19: Secondary | ICD-10-CM | POA: Diagnosis not present

## 2019-11-26 DIAGNOSIS — S72341A Displaced spiral fracture of shaft of right femur, initial encounter for closed fracture: Secondary | ICD-10-CM | POA: Diagnosis not present

## 2019-11-26 DIAGNOSIS — S7291XD Unspecified fracture of right femur, subsequent encounter for closed fracture with routine healing: Secondary | ICD-10-CM | POA: Diagnosis not present

## 2019-11-26 DIAGNOSIS — I10 Essential (primary) hypertension: Secondary | ICD-10-CM | POA: Diagnosis not present

## 2019-11-26 DIAGNOSIS — Z419 Encounter for procedure for purposes other than remedying health state, unspecified: Secondary | ICD-10-CM

## 2019-11-26 DIAGNOSIS — W19XXXA Unspecified fall, initial encounter: Secondary | ICD-10-CM | POA: Diagnosis present

## 2019-11-26 DIAGNOSIS — F0390 Unspecified dementia without behavioral disturbance: Secondary | ICD-10-CM | POA: Diagnosis not present

## 2019-11-26 DIAGNOSIS — D649 Anemia, unspecified: Secondary | ICD-10-CM | POA: Diagnosis not present

## 2019-11-26 DIAGNOSIS — G4733 Obstructive sleep apnea (adult) (pediatric): Secondary | ICD-10-CM | POA: Diagnosis not present

## 2019-11-26 DIAGNOSIS — M9701XA Periprosthetic fracture around internal prosthetic right hip joint, initial encounter: Secondary | ICD-10-CM

## 2019-11-26 DIAGNOSIS — F419 Anxiety disorder, unspecified: Secondary | ICD-10-CM | POA: Diagnosis not present

## 2019-11-26 DIAGNOSIS — M9701XD Periprosthetic fracture around internal prosthetic right hip joint, subsequent encounter: Secondary | ICD-10-CM | POA: Diagnosis not present

## 2019-11-26 DIAGNOSIS — Z96641 Presence of right artificial hip joint: Secondary | ICD-10-CM | POA: Diagnosis not present

## 2019-11-26 HISTORY — PX: ORIF FEMUR FRACTURE: SHX2119

## 2019-11-26 LAB — BASIC METABOLIC PANEL
Anion gap: 9 (ref 5–15)
BUN: 14 mg/dL (ref 8–23)
CO2: 26 mmol/L (ref 22–32)
Calcium: 8.9 mg/dL (ref 8.9–10.3)
Chloride: 104 mmol/L (ref 98–111)
Creatinine, Ser: 0.94 mg/dL (ref 0.61–1.24)
GFR calc Af Amer: >60 mL/min (ref >60)
GFR calc non Af Amer: >60 mL/min (ref >60)
Glucose, Bld: 101 mg/dL — ABNORMAL HIGH (ref 70–99)
Potassium: 4.1 mmol/L (ref 3.5–5.1)
Sodium: 139 mmol/L (ref 135–145)

## 2019-11-26 LAB — CBC
HCT: 38.6 % — ABNORMAL LOW (ref 39.0–52.0)
Hemoglobin: 12.8 g/dL — ABNORMAL LOW (ref 13.0–17.0)
MCH: 31.8 pg (ref 26.0–34.0)
MCHC: 33.2 g/dL (ref 30.0–36.0)
MCV: 96 fL (ref 80.0–100.0)
Platelets: 251 10*3/uL (ref 150–400)
RBC: 4.02 MIL/uL — ABNORMAL LOW (ref 4.22–5.81)
RDW: 14.3 % (ref 11.5–15.5)
WBC: 8.3 10*3/uL (ref 4.0–10.5)
nRBC: 0 % (ref 0.0–0.2)

## 2019-11-26 SURGERY — OPEN REDUCTION INTERNAL FIXATION (ORIF) DISTAL FEMUR FRACTURE
Anesthesia: Spinal | Site: Hip | Laterality: Right

## 2019-11-26 MED ORDER — ACETAMINOPHEN 325 MG PO TABS
325.0000 mg | ORAL_TABLET | Freq: Four times a day (QID) | ORAL | Status: DC | PRN
  Administered 2019-11-27 – 2019-11-29 (×4): 650 mg via ORAL
  Filled 2019-11-26 (×4): qty 2

## 2019-11-26 MED ORDER — ORAL CARE MOUTH RINSE: 15.0000 mL | Freq: Once | OROMUCOSAL | Status: AC

## 2019-11-26 MED ORDER — PRAVASTATIN SODIUM 20 MG PO TABS
20.0000 mg | ORAL_TABLET | Freq: Every day | ORAL | Status: DC
  Administered 2019-11-26 – 2019-11-28 (×3): 20 mg via ORAL
  Filled 2019-11-26 (×3): qty 1

## 2019-11-26 MED ORDER — BUPIVACAINE HCL (PF) 0.5 % IJ SOLN
INTRAMUSCULAR | Status: DC | PRN
  Administered 2019-11-26: 2.5 mL

## 2019-11-26 MED ORDER — CHLORHEXIDINE GLUCONATE 0.12 % MT SOLN: 15.0000 mL | Freq: Once | OROMUCOSAL | Status: AC

## 2019-11-26 MED ORDER — PROPOFOL 10 MG/ML IV BOLUS
INTRAVENOUS | Status: AC
  Filled 2019-11-26: qty 20

## 2019-11-26 MED ORDER — ENOXAPARIN SODIUM 40 MG/0.4ML ~~LOC~~ SOLN
40.0000 mg | SUBCUTANEOUS | Status: DC
  Administered 2019-11-27 – 2019-11-29 (×3): 40 mg via SUBCUTANEOUS
  Filled 2019-11-26 (×3): qty 0.4

## 2019-11-26 MED ORDER — ACETAMINOPHEN 10 MG/ML IV SOLN
INTRAVENOUS | Status: DC | PRN
  Administered 2019-11-26: 1000 mg via INTRAVENOUS

## 2019-11-26 MED ORDER — ONDANSETRON HCL 4 MG PO TABS: 4.0000 mg | ORAL_TABLET | Freq: Four times a day (QID) | ORAL | Status: DC | PRN

## 2019-11-26 MED ORDER — ONDANSETRON HCL 4 MG/2ML IJ SOLN: 4.0000 mg | Freq: Four times a day (QID) | INTRAMUSCULAR | Status: DC | PRN

## 2019-11-26 MED ORDER — CEFAZOLIN SODIUM-DEXTROSE 2-4 GM/100ML-% IV SOLN
2.0000 g | INTRAVENOUS | Status: AC
  Administered 2019-11-26: 2 g via INTRAVENOUS

## 2019-11-26 MED ORDER — HYDROMORPHONE HCL 1 MG/ML IJ SOLN
INTRAMUSCULAR | Status: AC
  Administered 2019-11-26: 0.25 mg via INTRAVENOUS
  Filled 2019-11-26: qty 1

## 2019-11-26 MED ORDER — NON FORMULARY
Status: DC | PRN
  Administered 2019-11-26: 20 mL

## 2019-11-26 MED ORDER — SODIUM CHLORIDE (PF) 0.9 % IJ SOLN
INTRAMUSCULAR | Status: DC | PRN
  Administered 2019-11-26: 20 mL

## 2019-11-26 MED ORDER — METOCLOPRAMIDE HCL 5 MG/ML IJ SOLN: 5.0000 mg | Freq: Three times a day (TID) | INTRAMUSCULAR | Status: DC | PRN

## 2019-11-26 MED ORDER — FENTANYL CITRATE (PF) 100 MCG/2ML IJ SOLN
INTRAMUSCULAR | Status: AC
  Administered 2019-11-26: 50 ug via INTRAVENOUS
  Filled 2019-11-26: qty 2

## 2019-11-26 MED ORDER — CEFAZOLIN SODIUM-DEXTROSE 2-4 GM/100ML-% IV SOLN
2.0000 g | Freq: Four times a day (QID) | INTRAVENOUS | Status: DC
  Administered 2019-11-26: 2 g via INTRAVENOUS
  Filled 2019-11-26 (×3): qty 100

## 2019-11-26 MED ORDER — MIDAZOLAM HCL 2 MG/2ML IJ SOLN
INTRAMUSCULAR | Status: AC
  Filled 2019-11-26: qty 2

## 2019-11-26 MED ORDER — LACTATED RINGERS IV SOLN: INTRAVENOUS | Status: DC

## 2019-11-26 MED ORDER — PROPOFOL 500 MG/50ML IV EMUL
INTRAVENOUS | Status: AC
  Filled 2019-11-26: qty 50

## 2019-11-26 MED ORDER — LISINOPRIL 20 MG PO TABS
20.0000 mg | ORAL_TABLET | Freq: Every day | ORAL | Status: DC
  Administered 2019-11-27 – 2019-11-28 (×2): 20 mg via ORAL
  Filled 2019-11-26 (×2): qty 1

## 2019-11-26 MED ORDER — MIDAZOLAM HCL 5 MG/5ML IJ SOLN
INTRAMUSCULAR | Status: DC | PRN
  Administered 2019-11-26 (×2): 1 mg via INTRAVENOUS

## 2019-11-26 MED ORDER — SODIUM CHLORIDE FLUSH 0.9 % IV SOLN
INTRAVENOUS | Status: AC
  Filled 2019-11-26: qty 10

## 2019-11-26 MED ORDER — METOCLOPRAMIDE HCL 10 MG PO TABS: 5.0000 mg | ORAL_TABLET | Freq: Three times a day (TID) | ORAL | Status: DC | PRN

## 2019-11-26 MED ORDER — FENTANYL CITRATE (PF) 100 MCG/2ML IJ SOLN
INTRAMUSCULAR | Status: DC | PRN
Start: 2019-11-26 — End: 2019-11-26
  Administered 2019-11-26 (×2): 50 ug via INTRAVENOUS

## 2019-11-26 MED ORDER — GLYCOPYRROLATE 0.2 MG/ML IJ SOLN
INTRAMUSCULAR | Status: DC | PRN
  Administered 2019-11-26: .2 mg via INTRAVENOUS

## 2019-11-26 MED ORDER — FENTANYL CITRATE (PF) 100 MCG/2ML IJ SOLN
25.0000 ug | INTRAMUSCULAR | Status: DC | PRN
  Administered 2019-11-26: 50 ug via INTRAVENOUS

## 2019-11-26 MED ORDER — HYDROCODONE-ACETAMINOPHEN 5-325 MG PO TABS: 1.0000 | ORAL_TABLET | ORAL | Status: DC | PRN

## 2019-11-26 MED ORDER — DONEPEZIL HCL 5 MG PO TABS
5.0000 mg | ORAL_TABLET | Freq: Every day | ORAL | Status: DC
  Administered 2019-11-27 – 2019-11-28 (×2): 5 mg via ORAL
  Filled 2019-11-26 (×2): qty 1

## 2019-11-26 MED ORDER — MORPHINE SULFATE (PF) 2 MG/ML IV SOLN: 0.5000 mg | INTRAVENOUS | Status: DC | PRN

## 2019-11-26 MED ORDER — MEMANTINE HCL 5 MG PO TABS
5.0000 mg | ORAL_TABLET | Freq: Two times a day (BID) | ORAL | Status: DC
  Administered 2019-11-26 – 2019-11-29 (×6): 5 mg via ORAL
  Filled 2019-11-26 (×6): qty 1

## 2019-11-26 MED ORDER — ACETAMINOPHEN 500 MG PO TABS
500.0000 mg | ORAL_TABLET | Freq: Four times a day (QID) | ORAL | Status: AC
  Administered 2019-11-26 – 2019-11-27 (×4): 500 mg via ORAL
  Filled 2019-11-26 (×4): qty 1

## 2019-11-26 MED ORDER — HYDROCODONE-ACETAMINOPHEN 7.5-325 MG PO TABS
1.0000 | ORAL_TABLET | ORAL | Status: DC | PRN
  Administered 2019-11-26 – 2019-11-27 (×2): 1 via ORAL
  Filled 2019-11-26 (×2): qty 1

## 2019-11-26 MED ORDER — SODIUM CHLORIDE 0.9 % IV SOLN
INTRAVENOUS | Status: DC | PRN
  Administered 2019-11-26: 30 ug/min via INTRAVENOUS

## 2019-11-26 MED ORDER — ONDANSETRON HCL 4 MG/2ML IJ SOLN: 4.0000 mg | Freq: Once | INTRAMUSCULAR | Status: DC | PRN

## 2019-11-26 MED ORDER — FENTANYL CITRATE (PF) 100 MCG/2ML IJ SOLN
INTRAMUSCULAR | Status: AC
  Filled 2019-11-26: qty 2

## 2019-11-26 MED ORDER — BUPIVACAINE HCL (PF) 0.5 % IJ SOLN
INTRAMUSCULAR | Status: AC
  Filled 2019-11-26: qty 10

## 2019-11-26 MED ORDER — BUPIVACAINE-EPINEPHRINE (PF) 0.25% -1:200000 IJ SOLN
INTRAMUSCULAR | Status: AC
  Filled 2019-11-26: qty 30

## 2019-11-26 MED ORDER — ACETAMINOPHEN 10 MG/ML IV SOLN
INTRAVENOUS | Status: AC
  Filled 2019-11-26: qty 100

## 2019-11-26 MED ORDER — KETOROLAC TROMETHAMINE 15 MG/ML IJ SOLN
INTRAMUSCULAR | Status: AC
  Administered 2019-11-26: 15 mg via INTRAVENOUS
  Filled 2019-11-26: qty 1

## 2019-11-26 MED ORDER — CEFAZOLIN SODIUM-DEXTROSE 2-4 GM/100ML-% IV SOLN
INTRAVENOUS | Status: AC
  Administered 2019-11-27: 2 g via INTRAVENOUS
  Filled 2019-11-26: qty 100

## 2019-11-26 MED ORDER — PHENYLEPHRINE HCL (PRESSORS) 10 MG/ML IV SOLN
INTRAVENOUS | Status: DC | PRN
  Administered 2019-11-26 (×2): 100 ug via INTRAVENOUS

## 2019-11-26 MED ORDER — CHLORHEXIDINE GLUCONATE 0.12 % MT SOLN
OROMUCOSAL | Status: AC
  Administered 2019-11-26: 15 mL via OROMUCOSAL
  Filled 2019-11-26: qty 15

## 2019-11-26 MED ORDER — PROPOFOL 500 MG/50ML IV EMUL
INTRAVENOUS | Status: DC | PRN
  Administered 2019-11-26: 50 ug/kg/min via INTRAVENOUS

## 2019-11-26 MED ORDER — HYDROMORPHONE HCL 1 MG/ML IJ SOLN
0.2500 mg | INTRAMUSCULAR | Status: DC | PRN
  Administered 2019-11-26 (×2): 0.25 mg via INTRAVENOUS

## 2019-11-26 MED ORDER — BACITRACIN 50000 UNITS IM SOLR
INTRAMUSCULAR | Status: AC
  Filled 2019-11-26: qty 1

## 2019-11-26 MED ORDER — DOCUSATE SODIUM 100 MG PO CAPS
100.0000 mg | ORAL_CAPSULE | Freq: Two times a day (BID) | ORAL | Status: DC
  Administered 2019-11-26 – 2019-11-29 (×6): 100 mg via ORAL
  Filled 2019-11-26 (×6): qty 1

## 2019-11-26 MED ORDER — KETOROLAC TROMETHAMINE 15 MG/ML IJ SOLN
15.0000 mg | Freq: Four times a day (QID) | INTRAMUSCULAR | Status: AC
  Administered 2019-11-27 (×3): 15 mg via INTRAVENOUS
  Filled 2019-11-26 (×3): qty 1

## 2019-11-26 SURGICAL SUPPLY — 40 items
BIT DRILL CANN QC 4.3X180 (BIT) ×1 IMPLANT
BIT DRILL Q/COUPLING 1 (BIT) ×3 IMPLANT
BNDG COHESIVE 6X5 TAN STRL LF (GAUZE/BANDAGES/DRESSINGS) ×3 IMPLANT
CABLE 1.7 (Orthopedic Implant) ×6 IMPLANT
CABLE 1.7MM (Orthopedic Implant) ×3 IMPLANT
CANISTER SUCT 1200ML W/VALVE (MISCELLANEOUS) ×6 IMPLANT
CHLORAPREP W/TINT 26 (MISCELLANEOUS) ×6 IMPLANT
COVER WAND RF STERILE (DRAPES) ×3 IMPLANT
DRAPE C-ARM XRAY 36X54 (DRAPES) ×3 IMPLANT
DRILL BIT 4.3MM (BIT) ×3
DRSG AQUACEL AG ADV 3.5X14 (GAUZE/BANDAGES/DRESSINGS) ×3 IMPLANT
ELECT REM PT RETURN 9FT ADLT (ELECTROSURGICAL) ×3
ELECTRODE REM PT RTRN 9FT ADLT (ELECTROSURGICAL) ×1 IMPLANT
GAUZE SPONGE 4X4 12PLY STRL (GAUZE/BANDAGES/DRESSINGS) ×3 IMPLANT
GAUZE XEROFORM 1X8 LF (GAUZE/BANDAGES/DRESSINGS) ×6 IMPLANT
GLOVE INDICATOR 8.0 STRL GRN (GLOVE) ×3 IMPLANT
GLOVE SURG ORTHO 8.0 STRL STRW (GLOVE) ×6 IMPLANT
GOWN STRL REUS W/ TWL LRG LVL3 (GOWN DISPOSABLE) ×2 IMPLANT
GOWN STRL REUS W/TWL LRG LVL3 (GOWN DISPOSABLE) ×4
GUIDEWIRE 2.0MM (WIRE) ×6 IMPLANT
KIT TURNOVER KIT A (KITS) ×3 IMPLANT
MAT ABSORB  FLUID 56X50 GRAY (MISCELLANEOUS) ×2
MAT ABSORB FLUID 56X50 GRAY (MISCELLANEOUS) ×1 IMPLANT
NS IRRIG 1000ML POUR BTL (IV SOLUTION) ×3 IMPLANT
PACK ANTERIOR HIP CUSTOM (KITS) ×3 IMPLANT
PAD CAST CTTN 4X4 STRL (SOFTGOODS) ×1 IMPLANT
PADDING CAST COTTON 4X4 STRL (SOFTGOODS) ×2
PIN POSITION 4.5 THRED (Pin) ×12 IMPLANT
PLATE CRV LCP 14 HOLE 4.5X265 (Plate) ×3 IMPLANT
SCREW CORTEX ST 4.5X30 (Screw) ×3 IMPLANT
SCREW CORTEX ST 4.5X38 (Screw) ×6 IMPLANT
SCREW CORTEX ST 4.5X40 (Screw) ×6 IMPLANT
SCREW LOCKING 5.0 12 STRL (Screw) ×3 IMPLANT
SCREW LOCKING 5.0 14 STRL (Screw) ×6 IMPLANT
SCREW LOCKING 5.0 18 STRL (Screw) ×3 IMPLANT
SUT DVC 2 QUILL PDO  T11 36X36 (SUTURE) ×2
SUT DVC 2 QUILL PDO T11 36X36 (SUTURE) ×1 IMPLANT
SUT VIC AB 1 CT1 18XCR BRD 8 (SUTURE) ×1 IMPLANT
SUT VIC AB 1 CT1 8-18 (SUTURE) ×2
SUT VIC AB 2-0 CT1 18 (SUTURE) ×3 IMPLANT

## 2019-11-26 NOTE — Transfer of Care (Signed)
Immediate Anesthesia Transfer of Care Note  Patient: Eric Rose  Procedure(s) Performed: OPEN REDUCTION INTERNAL FIXATION (ORIF) proximal FEMUR FRACTURE (Right Hip)  Patient Location: PACU  Anesthesia Type:Spinal  Level of Consciousness: drowsy  Airway & Oxygen Therapy: Patient Spontanous Breathing and Patient connected to face mask oxygen  Post-op Assessment: Report given to RN  Post vital signs: stable  Last Vitals:  Vitals Value Taken Time  BP 110/73 11/26/19 1740  Temp    Pulse 50 11/26/19 1742  Resp 19 11/26/19 1742  SpO2 94 % 11/26/19 1742  Vitals shown include unvalidated device data.  Last Pain:  Vitals:   11/26/19 1305  TempSrc: Temporal  PainSc: 7          Complications: No complications documented.

## 2019-11-26 NOTE — Anesthesia Procedure Notes (Signed)
Spinal  Start time: 11/26/2019 2:05 PM Staffing Performed: resident/CRNA  Preanesthetic Checklist Completed: patient identified, IV checked, site marked, risks and benefits discussed, surgical consent, monitors and equipment checked, pre-op evaluation and timeout performed Spinal Block Patient position: sitting Prep: ChloraPrep Patient monitoring: heart rate, continuous pulse ox and blood pressure Approach: midline Location: L3-4 Injection technique: single-shot Needle Needle type: [place]  Needle gauge: 24 G Assessment Sensory level: T6 Additional Notes Negative heme, negative paresthesia, no pain with injection, good free flow CSF pre/post injection

## 2019-11-26 NOTE — Anesthesia Preprocedure Evaluation (Signed)
Anesthesia Evaluation  Patient identified by MRN, date of birth, ID band Patient awake    Reviewed: Allergy & Precautions, NPO status , Patient's Chart, lab work & pertinent test results  History of Anesthesia Complications Negative for: history of anesthetic complications  Airway Mallampati: II  TM Distance: >3 FB Neck ROM: Full    Dental  (+) Poor Dentition, Missing   Pulmonary sleep apnea , neg COPD,    breath sounds clear to auscultation- rhonchi (-) wheezing      Cardiovascular hypertension, Pt. on medications (-) CAD, (-) Past MI, (-) Cardiac Stents and (-) CABG  Rhythm:Regular Rate:Normal - Systolic murmurs and - Diastolic murmurs    Neuro/Psych neg Seizures PSYCHIATRIC DISORDERS Anxiety negative neurological ROS     GI/Hepatic Neg liver ROS, GERD  ,  Endo/Other  negative endocrine ROSneg diabetes  Renal/GU negative Renal ROS     Musculoskeletal  (+) Arthritis ,   Abdominal (+) - obese,   Peds  Hematology  (+) anemia ,   Anesthesia Other Findings Past Medical History: No date: Allergy No date: Anxiety No date: BPH (benign prostatic hypertrophy) with urinary obstruction     Comment:  followed by urologist No date: Decreased libido No date: Dementia ([place]) 08/08/2015: Erectile dysfunction No date: GERD (gastroesophageal reflux disease) No date: History of pneumonia No date: Hypertension No date: Lumbago 02/21/2017: Vision loss of left eye     Comment:  Around 2000, damage to left eye (traumatic, playing               tennis)   Reproductive/Obstetrics                             Lab Results  Component Value Date   WBC 8.3 11/26/2019   HGB 12.8 (L) 11/26/2019   HCT 38.6 (L) 11/26/2019   MCV 96.0 11/26/2019   PLT 251 11/26/2019    Anesthesia Physical Anesthesia Plan  ASA: III  Anesthesia Plan: Spinal   Post-op Pain Management:    Induction:   PONV Risk Score and  Plan: 1 and Ondansetron and Propofol infusion  Airway Management Planned: Natural Airway  Additional Equipment:   Intra-op Plan:   Post-operative Plan:   Informed Consent: I have reviewed the patients History and Physical, chart, labs and discussed the procedure including the risks, benefits and alternatives for the proposed anesthesia with the patient or authorized representative who has indicated his/her understanding and acceptance.     Dental advisory given  Plan Discussed with: CRNA and Anesthesiologist  Anesthesia Plan Comments:         Anesthesia Quick Evaluation

## 2019-11-26 NOTE — H&P (Signed)
NAME: Eric Rose MRN:   [id number] DOB:   06-11-1955     HISTORY AND PHYSICAL  CHIEF COMPLAINT:  Right hip pain  HISTORY:   Eric Peaster Cummingsis a 64 y.o. male  with right  Hip Pain Patient complains of right hip pain. Onset of the symptoms was a week ago. Inciting event: fell while walking. The patient reports the hip pain is worse with weight bearing.  The patient was seen in our urgent care and Dr. Harlow Mares consulted.  Patient has a periprostetic proximal femur fracture displaced. PAST MEDICAL HISTORY:   Past Medical History:  Diagnosis Date  . Allergy   . Anxiety   . BPH (benign prostatic hypertrophy) with urinary obstruction    followed by urologist  . Decreased libido   . Dementia ([place])   . Erectile dysfunction 08/08/2015  . GERD (gastroesophageal reflux disease)   . History of pneumonia   . Hypertension   . Lumbago   . 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   Around 2000, damage to left eye (traumatic, playing tennis)    PAST SURGICAL HISTORY:   Past Surgical History:  Procedure Laterality Date  . JOINT REPLACEMENT Bilateral    hip  . 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   Procedure: TOTAL HIP ARTHROPLASTY;  Surgeon: Earnestine Leys, MD;  Location: [facility] ORS;  Service: Orthopedics;  Laterality: Left;    MEDICATIONS:  (Not in a hospital admission)   ALLERGIES:  No Known Allergies  REVIEW OF SYSTEMS:   Negative except HPI  FAMILY HISTORY:   Family History  Problem Relation Age of Onset  . Diabetes Mother   . Heart disease Mother   . Alzheimer's disease Mother   . Prostate cancer Father   . Prostate cancer Brother   . Prostate cancer Paternal Uncle   . Bladder Cancer Neg Hx   . Kidney cancer Neg Hx     SOCIAL HISTORY:   reports that he has never smoked. He has never used smokeless tobacco. He reports that he does not drink alcohol and does not use drugs.  PHYSICAL EXAM:  General appearance: alert, cooperative and no distress Neck: no JVD and supple,  symmetrical, trachea midline Resp: clear to auscultation bilaterally Cardio: regular rate and rhythm, S1, S2 normal, no murmur, click, rub or gallop GI: soft, non-tender; bowel sounds normal; no masses,  no organomegaly Extremities: extremities normal, atraumatic, no cyanosis or edema and Homans sign is negative, no sign of DVT Pulses: 2+ and symmetric    LABORATORY STUDIES: No results for input(s): WBC, HGB, HCT, PLT in the last 72 hours.  No results for input(s): NA, K, CL, CO2, GLUCOSE, BUN, CREATININE, CALCIUM in the last 72 hours.  STUDIES/RESULTS:  No results found.  ASSESSMENT:  Right proximal femur fracture       Active Problems:   * No active hospital problems. *    PLAN:  Right proximal femur ORIF Carlynn Spry 11/26/2019. 11:48 AM

## 2019-11-26 NOTE — H&P (Deleted)
  The note originally documented on this encounter has been moved the the encounter in which it belongs.  

## 2019-11-27 ENCOUNTER — Ambulatory Visit: Payer: PPO | Admitting: Urology

## 2019-11-27 ENCOUNTER — Encounter: Payer: Self-pay | Admitting: Orthopedic Surgery

## 2019-11-27 MED ORDER — CEFAZOLIN SODIUM-DEXTROSE 2-4 GM/100ML-% IV SOLN
2.0000 g | Freq: Four times a day (QID) | INTRAVENOUS | Status: AC
  Administered 2019-11-27: 2 g via INTRAVENOUS
  Filled 2019-11-27 (×3): qty 100

## 2019-11-27 MED FILL — Sodium Chloride Irrigation Soln 0.9%: Qty: 1000 | Status: AC

## 2019-11-27 MED FILL — Bacitracin Intramuscular For Soln 50000 Unit: INTRAMUSCULAR | Qty: 2 | Status: AC

## 2019-11-27 NOTE — Plan of Care (Signed)
  Problem: Clinical Measurements: Goal: Ability to maintain clinical measurements within normal limits will improve Outcome: Progressing   

## 2019-11-27 NOTE — Op Note (Signed)
11/26/2019  10:13 AM  PATIENT:  Eric Rose    PRE-OPERATIVE DIAGNOSIS:  M97.01XD Periprosthetic fracture around internal prosthetic right hip joint, subsequent encounter  POST-OPERATIVE DIAGNOSIS:  Same  PROCEDURE:  OPEN REDUCTION INTERNAL FIXATION (ORIF) proximal FEMUR FRACTURE, PERIPROSTHETIC, RIGHT  SURGEON:  Lovell Sheehan, MD   ASSIST: Carlynn Spry, PA-C  ANESTHESIA: Spinal  PREOPERATIVE INDICATIONS:  Eric Rose is a  64 y.o. male with a diagnosis of M97.01XD Periprosthetic fracture around internal prosthetic right hip joint, subsequent encounter who elected for surgical management after extensive discussion of  the risks benefits and alternatives  with the patient preoperatively including but not limited to the risks of infection, bleeding, nerve injury, cardiopulmonary complications, the need for revision surgery, among others, and the patient was willing to proceed.  EBL: 250 cc  OPERATIVE IMPLANTS: Synthes 4.5 mm lateral locking plate, 14 hole with 3 cables, 4 locking screws and 4 non-locking screws  OPERATIVE FINDINGS: Closed, displaced, spiral fracture of the proximal femur with intact femoral and acetabular components  OPERATIVE PROCEDURE: The patient was brought to the operating room and underwent satisfactory anesthesia and was placed in the supine position on the HANA table with the operative leg in traction and the contralateral in a well leg holder.  The operative leg was prepped and draped in sterile fashion.  A longitudinal lateral incision was made over the proximal third of the femur with dissection carried out sharply through fascia down to bone.  The vastus lateralis was lifted anteriorly to expose the shaft.  The fracture site was identified and reduced with traction and external rotation.  Irrigation was used. The fracture fragments were manipulated into the best possible position due to the comminution.  A Synthes 14 hole periprosthetic femur plate  was aligned along the femur.  It was temporarily fixated with a threaded pins. The curve was measured and using a large plate bender, the plate was bent.  A non-locking screw was placed through the proximal portion of the plate to bring the plate down to the bone.  The femur was reduced using 3 cables around the large medial fragment and the proximal locking screws were placed.  Fluoroscopy showed overall good alignment on AP and lateral views with good alignment.  Four distal non-locking screws were placed.  Fluoroscopy showed good alignment on AP and lateral views. Wound was again irrigated and closed with #2 Quill on fascia, 2-0 Vicryl on subcutaneous tissue and staples on all skin areas.  A sterile dressing was applied.  Sponge and needle count was correct.  Soft long-leg dressing was applied.  The patient was awakened and taken to recovery in good condition.   Kurtis Bushman, MD

## 2019-11-27 NOTE — Evaluation (Signed)
Physical Therapy Evaluation Patient Details Name: Eric Rose MRN: [id number] DOB: December 11, 1955 Today's Date: 11/27/2019   History of Present Illness  Kailash Hinze is a 69yoM PMH: bilat THA, sustained a fall whilst playing outside with niece, later noted to have Rt periprosthetic hip fracture. Pt presents now s/p periprostheic ORIF Rt proximal femur, TDWB.  Clinical Impression  Pt admitted with above diagnosis. Pt currently with functional limitations due to the deficits listed below (see "PT Problem List"). Upon entry, pt in bed, awake and agreeable to participate; wife at bedside. The pt is alert and oriented x4, pleasant, conversational, and generally a good historian, but wife/pt admit to some early difficulties with short term memory. Pain is severe with attempted movement of the limb in bed, but pt able to move to EOB with MinA of limb, transfer to standing c supervision, and AMB 31ft with heavy effort, subsequent increased RR. Pt will need to perform stairs training prior to DC home. Functional mobility assessment demonstrates increased effort/time requirements, fair tolerance, and need for physical assistance, whereas the patient performed these at a higher level of independence PTA. Pt will benefit from skilled PT intervention to increase independence and safety with basic mobility in preparation for discharge to the venue listed below.        Follow Up Recommendations Home health PT;Supervision for mobility/OOB    Equipment Recommendations  Wheelchair (measurements PT);Wheelchair cushion (measurements PT);Other (comment) (elevated leg rests)    Recommendations for Other Services       Precautions / Restrictions Precautions Precautions: Fall Restrictions Weight Bearing Restrictions: Yes RLE Weight Bearing: Touchdown weight bearing      Mobility  Bed Mobility Overal bed mobility: Needs Assistance Bed Mobility: Supine to Sit     Supine to sit: Min assist      General bed mobility comments: minA of RLE  Transfers Overall transfer level: Needs assistance Equipment used: Rolling walker (2 wheeled) Transfers: Sit to/from Stand Sit to Stand: Supervision         General transfer comment: able to maintain TDWB on RLE  Ambulation/Gait   Gait Distance (Feet): 10 Feet Assistive device: Rolling walker (2 wheeled)       General Gait Details: maintains TDWB  Stairs            Wheelchair Mobility    Modified Rankin (Stroke Patients Only)       Balance Overall balance assessment: Modified Independent                                           Pertinent Vitals/Pain Pain Assessment: Faces Faces Pain Scale: Hurts even more Pain Location: Rt leg with bed mobility Pain Intervention(s): Limited activity within patient's tolerance;Monitored during session;Premedicated before session;Repositioned    Home Living Family/patient expects to be discharged to:: Private residence Living Arrangements: Spouse/significant other Available Help at Discharge: Family Type of Home: House Home Access: Stairs to enter Entrance Stairs-Rails: Can reach both;Left;Right Entrance Stairs-Number of Steps: 3 Home Layout: Two level        Prior Function                 Hand Dominance        Extremity/Trunk Assessment                Communication      Cognition Arousal/Alertness: Awake/alert Behavior During Therapy: WFL for tasks assessed/performed Overall  Cognitive Status: Within Functional Limits for tasks assessed                                        General Comments      Exercises     Assessment/Plan    PT Assessment Patient needs continued PT services  PT Problem List Decreased activity tolerance;Decreased balance;Decreased mobility;Decreased knowledge of precautions       PT Treatment Interventions DME instruction;Gait training;Balance training;Stair training;Functional  mobility training;Therapeutic activities;Therapeutic exercise;Patient/family education    PT Goals (Current goals can be found in the Care Plan section)  Acute Rehab PT Goals Patient Stated Goal: DC to home PT Goal Formulation: With patient Time For Goal Achievement: 12/11/19 Potential to Achieve Goals: Good    Frequency BID   Barriers to discharge        Co-evaluation               AM-PAC PT "6 Clicks" Mobility  Outcome Measure Help needed turning from your back to your side while in a flat bed without using bedrails?: A Little Help needed moving from lying on your back to sitting on the side of a flat bed without using bedrails?: A Little Help needed moving to and from a bed to a chair (including a wheelchair)?: A Little Help needed standing up from a chair using your arms (e.g., wheelchair or bedside chair)?: A Little Help needed to walk in hospital room?: A Little Help needed climbing 3-5 steps with a railing? : A Lot 6 Click Score: 17    End of Session Equipment Utilized During Treatment: Gait belt Activity Tolerance: Patient tolerated treatment well Patient left: in chair;with family/visitor present;with call bell/phone within reach;with chair alarm set Nurse Communication: Mobility status PT Visit Diagnosis: Unsteadiness on feet (R26.81);Difficulty in walking, not elsewhere classified (R26.2);Other abnormalities of gait and mobility (R26.89)    Time: 1856-3149 PT Time Calculation (min) (ACUTE ONLY): 26 min   Charges:   PT Evaluation $PT Eval Moderate Complexity: 1 Mod PT Treatments $Gait Training: 8-22 mins        1:02 PM, 11/27/19 Etta Grandchild, PT, DPT Physical Therapist - [facility]  [phone] ([place])    [place] C 11/27/2019, 12:54 PM

## 2019-11-27 NOTE — Evaluation (Signed)
Occupational Therapy Evaluation Patient Details Name: Eric Rose MRN: [id number] DOB: March 04, 1956 Today's Date: 11/27/2019    History of Present Illness Daemyn Gariepy is a 25yoM PMH: bilat THA, sustained a fall whilst playing outside with niece, later noted to have Rt periprosthetic hip fracture. Pt presents now s/p periprostheic ORIF Rt proximal femur, TDWB.   Clinical Impression   Pt was seen for OT evaluation this date POD #1 from above named sx. Prior to hospital admission, pt was MOD I with ADLs/ADL mobility. Pt lives with spouse in tri-level home with 3 STE. Currently pt demonstrates impairments as described below (See OT problem list) which functionally limit his ability to perform ADL/self-care tasks. Pt currently requires MIN A/CGA with ADL transfers with RW and MOD A with seated LB ADLs.  Pt requires cues for safe use of walker and maintaining WB restriction to R LE. Pt would benefit from skilled OT services to address noted impairments and functional limitations (see below for any additional details) in order to maximize safety and independence while minimizing falls risk and caregiver burden. Upon hospital discharge, recommend 24/7 SUPV from family and HHOT to maximize pt safety and return to functional independence during meaningful occupations of daily life.     Follow Up Recommendations  Home health OT;Supervision/Assistance - 24 hour    Equipment Recommendations  3 in 1 bedside commode;Tub/shower seat;Other (comment) (grab bars in restroom, AE for LB ADLs)    Recommendations for Other Services       Precautions / Restrictions Precautions Precautions: Fall Restrictions Weight Bearing Restrictions: Yes RLE Weight Bearing: Touchdown weight bearing      Mobility Bed Mobility               General bed mobility comments: in chair at beginning and end of session  Transfers Overall transfer level: Needs assistance Equipment used: Rolling walker (2  wheeled) Transfers: Sit to/from Stand Sit to Stand: Min guard;Min assist         General transfer comment: cues for safe hand placement relative to RW use    Balance Overall balance assessment: Needs assistance   Sitting balance-Leahy Scale: Good       Standing balance-Leahy Scale: Fair Standing balance comment: requires B UE support to maintain WB                           ADL either performed or assessed with clinical judgement   ADL Overall ADL's : Needs assistance/impaired                                       General ADL Comments: Setup for seated UB ADLs, MOD A for LB ADLs seated. CGA/MIN A with ADL transfers with RW-MIN/MOD tactile/verbal cues for safety.     Vision Patient Visual Report: No change from baseline       Perception     Praxis      Pertinent Vitals/Pain Pain Assessment: Faces Faces Pain Scale: Hurts even more Pain Location: R LE Pain Descriptors / Indicators: Aching Pain Intervention(s): Limited activity within patient's tolerance;Monitored during session     Hand Dominance     Extremity/Trunk Assessment Upper Extremity Assessment Upper Extremity Assessment: Overall WFL for tasks assessed   Lower Extremity Assessment Lower Extremity Assessment: Defer to PT evaluation;RLE deficits/detail RLE: Unable to fully assess due to pain  Communication Communication Communication: No difficulties   Cognition Arousal/Alertness: Awake/alert Behavior During Therapy: Restless;Impulsive;WFL for tasks assessed/performed Overall Cognitive Status: History of cognitive impairments - at baseline                                 General Comments: Pt with some notable short term memory deficits with safe use of RW, cue/sequencing difficulties   General Comments       Exercises Other Exercises: OT facilitates ed re: role of OT, LB ADL considerations for adaptation/modification, safe use of RW. Pt with  moderate understanding verbalized, but poor carryover demo'ed. Pt's spouse with good understanding.   Shoulder Instructions      Home Living Family/patient expects to be discharged to:: Private residence   Available Help at Discharge: Family Type of Home: House Home Access: Stairs to enter   Entrance Stairs-Rails: Can reach both;Left;Right Home Layout: Other (Comment);Able to live on main level with bedroom/bathroom (tri-level) Alternate Level Stairs-Number of Steps: flight   Bathroom Shower/Tub: Tub/shower unit;Other (comment) (other bathroom is 1/2 bath on main level. Pt has been doing bird baths.)         Home Equipment: Walker - 2 wheels;Cane - single point;Bedside commode          Prior Functioning/Environment Level of Independence: Independent with assistive device(s)        Comments: MOD I with fxl mobility and ADLs        OT Problem List: Decreased strength;Decreased range of motion;Decreased activity tolerance;Impaired balance (sitting and/or standing);Decreased cognition;Decreased safety awareness;Decreased knowledge of use of DME or AE;Pain      OT Treatment/Interventions: Self-care/ADL training;DME and/or AE instruction;Therapeutic activities;Balance training;Therapeutic exercise;Energy conservation;Patient/family education    OT Goals(Current goals can be found in the care plan section) Acute Rehab OT Goals Patient Stated Goal: DC to home OT Goal Formulation: With patient/family Time For Goal Achievement: 12/11/19 Potential to Achieve Goals: Good ADL Goals Pt Will Perform Lower Body Dressing: with min guard assist;with adaptive equipment;sit to/from stand Pt Will Transfer to Toilet: with min guard assist;ambulating;bedside commode (BSC over standard commode in restroom, LRAD for amb, and use fo grab bars in restroom) Pt Will Perform Toileting - Clothing Manipulation and hygiene: with supervision;sit to/from stand (grab bars)  OT Frequency: Min 1X/week    Barriers to D/C:            Co-evaluation              AM-PAC OT "6 Clicks" Daily Activity     Outcome Measure Help from another person eating meals?: None Help from another person taking care of personal grooming?: A Little Help from another person toileting, which includes using toliet, bedpan, or urinal?: A Lot Help from another person bathing (including washing, rinsing, drying)?: A Lot Help from another person to put on and taking off regular upper body clothing?: A Little Help from another person to put on and taking off regular lower body clothing?: A Lot 6 Click Score: 16   End of Session Equipment Utilized During Treatment: Gait belt;Rolling walker  Activity Tolerance: Patient tolerated treatment well Patient left: in chair;with call bell/phone within reach;with chair alarm set;with family/visitor present  OT Visit Diagnosis: Unsteadiness on feet (R26.81);Other abnormalities of gait and mobility (R26.89);Muscle weakness (generalized) (M62.81);History of falling (Z91.81)                Time: 5465-0354 OT Time Calculation (min): 41 min Charges:  OT General Charges $OT Visit: 1 Visit OT Evaluation $OT Eval Moderate Complexity: 1 Mod OT Treatments $Self Care/Home Management : 8-22 mins $Therapeutic Activity: 8-22 mins  Gerrianne Scale, MS, OTR/L ascom [phone] 11/27/19, 5:43 PM

## 2019-11-27 NOTE — Progress Notes (Signed)
Subjective:  Patient reports pain as mild.  Patient somewhat confused.  Objective:   VITALS:   Vitals:   11/26/19 2159 11/26/19 2251 11/26/19 2345 11/27/19 0432  BP: 104/86 126/77 119/76 125/72  Pulse: 86 87 81 97  Resp: 18  17 17   Temp: 98 F (36.7 C) 97.7 F (36.5 C) 98.2 F (36.8 C) 98.5 F (36.9 C)  TempSrc: Oral Oral Oral Oral  SpO2: 99% 98% 99% 98%  Weight:      Height:        PHYSICAL EXAM:  Neurologically intact ABD soft Neurovascular intact Sensation intact distally Intact pulses distally Dorsiflexion/Plantar flexion intact Incision: scant drainage No cellulitis present Compartment soft  LABS  Results for orders placed or performed during the hospital encounter of 11/26/19 (from the past 24 hour(s))  Basic metabolic panel     Status: Abnormal   Collection Time: 11/26/19  1:00 PM  Result Value Ref Range   Sodium 139 135 - 145 mmol/L   Potassium 4.1 3.5 - 5.1 mmol/L   Chloride 104 98 - 111 mmol/L   CO2 26 22 - 32 mmol/L   Glucose, Bld 101 (H) 70 - 99 mg/dL   BUN 14 8 - 23 mg/dL   Creatinine, Ser 0.94 0.61 - 1.24 mg/dL   Calcium 8.9 8.9 - 10.3 mg/dL   GFR calc non Af Amer >60 >60 mL/min   GFR calc Af Amer >60 >60 mL/min   Anion gap 9 5 - 15  CBC     Status: Abnormal   Collection Time: 11/26/19  1:00 PM  Result Value Ref Range   WBC 8.3 4.0 - 10.5 K/uL   RBC 4.02 (L) 4.22 - 5.81 MIL/uL   Hemoglobin 12.8 (L) 13.0 - 17.0 g/dL   HCT 38.6 (L) 39 - 52 %   MCV 96.0 80.0 - 100.0 fL   MCH 31.8 26.0 - 34.0 pg   MCHC 33.2 30.0 - 36.0 g/dL   RDW 14.3 11.5 - 15.5 %   Platelets 251 150 - 400 K/uL   nRBC 0.0 0.0 - 0.2 %    DG C-Arm 1-60 Min  Result Date: 11/26/2019 CLINICAL DATA:  ORIF right femur fracture EXAM: RIGHT FEMUR 2 VIEWS; DG C-ARM 1-60 MIN COMPARISON:  None. FINDINGS: Seven low resolution intraoperative spot views of the right femur. Total fluoroscopy time was 48 seconds. The images demonstrate a right hip replacement with normal alignment.  Subsequent placement of lateral fixating plate, multiple distal fixating screws, and placement of cerclage wires about the proximal shaft of the femur for periprosthetic fracture. IMPRESSION: Intraoperative fluoroscopic assistance provided during right femur fracture fixation. Electronically Signed   By: Donavan Foil M.D.   On: 11/26/2019 19:11   DG C-Arm 1-60 Min  Result Date: 11/26/2019 CLINICAL DATA:  ORIF right femur fracture EXAM: RIGHT FEMUR 2 VIEWS; DG C-ARM 1-60 MIN COMPARISON:  None. FINDINGS: Seven low resolution intraoperative spot views of the right femur. Total fluoroscopy time was 48 seconds. The images demonstrate a right hip replacement with normal alignment. Subsequent placement of lateral fixating plate, multiple distal fixating screws, and placement of cerclage wires about the proximal shaft of the femur for periprosthetic fracture. IMPRESSION: Intraoperative fluoroscopic assistance provided during right femur fracture fixation. Electronically Signed   By: Donavan Foil M.D.   On: 11/26/2019 19:11   DG C-Arm 1-60 Min  Result Date: 11/26/2019 CLINICAL DATA:  ORIF right femur fracture EXAM: RIGHT FEMUR 2 VIEWS; DG C-ARM 1-60 MIN COMPARISON:  None.  FINDINGS: Seven low resolution intraoperative spot views of the right femur. Total fluoroscopy time was 48 seconds. The images demonstrate a right hip replacement with normal alignment. Subsequent placement of lateral fixating plate, multiple distal fixating screws, and placement of cerclage wires about the proximal shaft of the femur for periprosthetic fracture. IMPRESSION: Intraoperative fluoroscopic assistance provided during right femur fracture fixation. Electronically Signed   By: Donavan Foil M.D.   On: 11/26/2019 19:11   DG FEMUR, MIN 2 VIEWS RIGHT  Result Date: 11/26/2019 CLINICAL DATA:  ORIF right femur fracture EXAM: RIGHT FEMUR 2 VIEWS; DG C-ARM 1-60 MIN COMPARISON:  None. FINDINGS: Seven low resolution intraoperative spot views  of the right femur. Total fluoroscopy time was 48 seconds. The images demonstrate a right hip replacement with normal alignment. Subsequent placement of lateral fixating plate, multiple distal fixating screws, and placement of cerclage wires about the proximal shaft of the femur for periprosthetic fracture. IMPRESSION: Intraoperative fluoroscopic assistance provided during right femur fracture fixation. Electronically Signed   By: Donavan Foil M.D.   On: 11/26/2019 19:11    Assessment/Plan: 1 Day Post-Op   Active Problems:   Periprosthetic fracture around internal prosthetic right hip joint ([place])   Advance diet Up with therapy TDWB RLE Discharge home vs SNF We will continue to watch, Dressing change tomorrow    Carlynn Spry , PA-C 11/27/2019, 6:39 AM

## 2019-11-27 NOTE — Anesthesia Postprocedure Evaluation (Signed)
Anesthesia Post Note  Patient: Eric Rose  Procedure(s) Performed: OPEN REDUCTION INTERNAL FIXATION (ORIF) proximal FEMUR FRACTURE (Right Hip)  Patient location during evaluation: Nursing Unit Anesthesia Type: Spinal Level of consciousness: oriented and awake and alert Pain management: pain level controlled Vital Signs Assessment: post-procedure vital signs reviewed and stable Respiratory status: spontaneous breathing and respiratory function stable Cardiovascular status: blood pressure returned to baseline and stable Postop Assessment: no headache, no backache, no apparent nausea or vomiting and patient able to bend at knees Anesthetic complications: no   No complications documented.   Last Vitals:  Vitals:   11/27/19 0432 11/27/19 0812  BP: 125/72 116/85  Pulse: 97 82  Resp: 17 18  Temp: 36.9 C 36.4 C  SpO2: 98% 96%    Last Pain:  Vitals:   11/27/19 0812  TempSrc: Oral  PainSc:                  Lia Foyer

## 2019-11-27 NOTE — Progress Notes (Signed)
Physical Therapy Treatment Patient Details Name: Eric Rose MRN: [id number] DOB: 28-Sep-1955 Today's Date: 11/27/2019    History of Present Illness Eric Rose is a 45yoM PMH: bilat THA, sustained a fall whilst playing outside with niece, later noted to have Rt periprosthetic hip fracture. Pt presents now s/p periprostheic ORIF Rt proximal femur, TDWB.    PT Comments    Author returning for 2nd session this date. Pt in chair at entry, recently donned underpants alone, spilled some urine on self. Pt asking about sweat pants several times in session, reminded each time of plan discussed earlier to wait until OT session later in day. RLE remains very edematous which is concerning to patient, pt offered reassurance. Performed a couple chair based exercises, but pain is very limiting, will need help at home with these. Pt able to transfer again at supervision to minGuard level from recliner with RW, needs cues or tactile facilitation for safe equipment use at times. Pt able to AMB 33ft with high effort, arms plenty strong, but fatigues at 2ft requiring rest. Pt left up in chair at end of session. Wife joins [place] midway through, assists with chair follow in hallway. Pt seems more restless this afternoon compared to earlier in day. ST memory deficits more obvious, but remains cooperative and redirectable. Still needs to do stairs training prior to DC which may be more difficult from a cognition/memory standpoint than a physical standpoint.     Follow Up Recommendations  Home health PT;Supervision for mobility/OOB     Equipment Recommendations  Wheelchair (measurements PT);Wheelchair cushion (measurements PT);Other (comment)    Recommendations for Other Services       Precautions / Restrictions Precautions Precautions: Fall Restrictions Weight Bearing Restrictions: Yes RLE Weight Bearing: Touchdown weight bearing    Mobility  Bed Mobility Overal bed mobility: Needs Assistance Bed  Mobility: Supine to Sit     Supine to sit: Min assist     General bed mobility comments: in chair at beginning and end of session  Transfers Overall transfer level: Needs assistance Equipment used: Rolling walker (2 wheeled) Transfers: Sit to/from Stand Sit to Stand: Supervision         General transfer comment: able to maintain TDWB on RLE  Ambulation/Gait Ambulation/Gait assistance: Min guard Gait Distance (Feet): 70 Feet Assistive device: Rolling walker (2 wheeled) Gait Pattern/deviations: Step-to pattern     General Gait Details: maintains TDWB; requires rest break halfway (standing break)   Stairs             Wheelchair Mobility    Modified Rankin (Stroke Patients Only)       Balance Overall balance assessment: Modified Independent                                          Cognition Arousal/Alertness: Awake/alert Behavior During Therapy: Restless;Impulsive;WFL for tasks assessed/performed Overall Cognitive Status: History of cognitive impairments - at baseline                                 General Comments: more shor tterm memory deficits in PM session      Exercises General Exercises - Lower Extremity Ankle Circles/Pumps: AROM;15 reps;Both;Seated Long Arc Quad: AAROM;AROM;Both;10 reps;Seated;Limitations Long [employer] Limitations: extremem pain on Rt; requires maxA of RLE    General Comments  Pertinent Vitals/Pain Pain Assessment: Faces Faces Pain Scale: Hurts even more Pain Location: Rt leg Pain Intervention(s): Limited activity within patient's tolerance;Monitored during session;Premedicated before session;Repositioned    Home Living                      Prior Function            PT Goals (current goals can now be found in the care plan section) Acute Rehab PT Goals Patient Stated Goal: DC to home PT Goal Formulation: With patient Time For Goal Achievement: 12/11/19 Potential to  Achieve Goals: Good Progress towards PT goals: Progressing toward goals    Frequency    BID      PT Plan Current plan remains appropriate    Co-evaluation              AM-PAC PT "6 Clicks" Mobility   Outcome Measure  Help needed turning from your back to your side while in a flat bed without using bedrails?: A Little Help needed moving from lying on your back to sitting on the side of a flat bed without using bedrails?: A Little Help needed moving to and from a bed to a chair (including a wheelchair)?: A Little Help needed standing up from a chair using your arms (e.g., wheelchair or bedside chair)?: A Little Help needed to walk in hospital room?: A Little Help needed climbing 3-5 steps with a railing? : A Lot 6 Click Score: 17    End of Session Equipment Utilized During Treatment: Gait belt Activity Tolerance: Patient tolerated treatment well;Patient limited by fatigue;Patient limited by pain Patient left: in chair;with family/visitor present;with call bell/phone within reach;with chair alarm set Nurse Communication: Mobility status PT Visit Diagnosis: Unsteadiness on feet (R26.81);Difficulty in walking, not elsewhere classified (R26.2);Other abnormalities of gait and mobility (R26.89)     Time: 5701-7793 PT Time Calculation (min) (ACUTE ONLY): 24 min  Charges:  $Gait Training: 8-22 mins $Therapeutic Exercise: 8-22 mins                     4:25 PM, 11/27/19 Etta Grandchild, PT, DPT Physical Therapist - [facility]  [phone] ([place])     Dov D[place] C 11/27/2019, 4:15 PM

## 2019-11-28 MED ORDER — HYDROCODONE-ACETAMINOPHEN 5-325 MG PO TABS
1.0000 | ORAL_TABLET | ORAL | 0 refills | Status: DC | PRN
Start: 2019-11-28 — End: 2020-02-26

## 2019-11-28 MED ORDER — ASPIRIN EC 81 MG PO TBEC: 81.0000 mg | DELAYED_RELEASE_TABLET | Freq: Two times a day (BID) | ORAL | 11 refills | Status: DC

## 2019-11-28 MED ORDER — DOCUSATE SODIUM 100 MG PO CAPS: 100.0000 mg | ORAL_CAPSULE | Freq: Two times a day (BID) | ORAL | 0 refills | Status: DC

## 2019-11-28 NOTE — TOC Initial Note (Signed)
Transition of Care [facility]) - Initial/Assessment Note    Patient Details  Name: Eric Rose MRN: [id number] Date of Birth: 20-Sep-1955  [facility] CM/SW Contact:    Shelbie Ammons, RN Phone Number: 11/28/2019, 11:25 AM  Clinical Narrative:   RNCM spoke with patient at bedside and then called wife to discuss recommendations at discharge. Discussed with wife that the therapy dept is recommending he go home with home health services and to arrange a wheelchair and 3N1 for him to go home with. Wife is agreeable to home health and does not have any preferences as to the agency as long as the insurance covers it. She does not feel she needs any additional equipment at home right now and understands she can reach out to the MD if needed.  RNCM reached out to [place] with [facility] and she accepted the referral for PT/OT.                 Expected Discharge Plan: [place] Barriers to Discharge: No Barriers Identified   Patient Goals and CMS Choice     Choice offered to / list presented to : Spouse  Expected Discharge Plan and Services Expected Discharge Plan: [place]   Discharge Planning Services: CM Consult Post Acute Care Choice: [place] arrangements for the past 2 months: Single Family Home                           HH Arranged: PT, OT HH Agency: [facility] Date [place]: 11/28/19 Time HH Agency Contacted: 38 Representative spoke with at [place]: [place] Arrangements/Services Living arrangements for the past 2 months: [place] with:: Spouse Patient language and need for interpreter reviewed:: Yes Do you feel safe going back to the place where you live?: Yes      Need for Family Participation in Patient Care: Yes (Comment) Care giver support system in place?: Yes (comment)   Criminal Activity/Legal Involvement Pertinent to Current Situation/Hospitalization:  No - Comment as needed  Activities of Daily Living Home Assistive Devices/Equipment: None ADL Screening (condition at time of admission) Patient's cognitive ability adequate to safely complete daily activities?: Yes Is the patient deaf or have difficulty hearing?: No Does the patient have difficulty seeing, even when wearing glasses/contacts?: No Does the patient have difficulty concentrating, remembering, or making decisions?: No Patient able to express need for assistance with ADLs?: Yes Does the patient have difficulty dressing or bathing?: No Independently performs ADLs?: Yes (appropriate for developmental age) Does the patient have difficulty walking or climbing stairs?: Yes Weakness of Legs: Right Weakness of Arms/Hands: None  Permission Sought/Granted                  Emotional Assessment Appearance:: Appears stated age       Alcohol / Substance Use: Not Applicable Psych Involvement: No (comment)  Admission diagnosis:  Periprosthetic fracture around internal prosthetic right hip joint ([place]) [V89.01XA] Patient Active Problem List   Diagnosis Date Noted  . Periprosthetic fracture around internal prosthetic right hip joint ([place]) 11/26/2019  . Degeneration of lumbar intervertebral disc 10/02/2018  . Obstructive sleep apnea 04/04/2018  . Hyperlipidemia 04/04/2018  . Lump in neck 02/21/2017  . 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 Memory impairment 02/07/2016  . Erectile dysfunction 08/08/2015  . Anemia 08/06/2015  . Essential hypertension, benign 08/06/2015  .  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 Elevated PSA 01/12/2015  . BPH with obstruction/lower urinary tract symptoms 01/12/2015   PCP:  Towanda Malkin, MD Pharmacy:   [facility] [address] [place] - [place] [address] [place] [address] Phone: [phone] Fax: [phone]     Social Determinants of Health (SDOH) Interventions    Readmission Risk  Interventions No flowsheet data found.

## 2019-11-28 NOTE — Progress Notes (Signed)
Physical Therapy Treatment Patient Details Name: Eric Rose MRN: [id number] DOB: 03/24/1955 Today's Date: 11/28/2019    History of Present Illness Eric Rose is a 65yoM PMH: bilat THA, sustained a fall whilst playing outside with niece, later noted to have Rt periprosthetic hip fracture. Pt presents now s/p periprostheic ORIF Rt proximal femur, TDWB.    PT Comments    Pt in bed upon entry, wife finishing bathing of uppers. Pt agreeable to session. Focus of session is stairs training. minA to EOB, then supervision SPT to recliner. Pt mobilized in recliner to stairwell, given verbal education and demonstration of backward stairs performance with RW, no rails. Pt successfully follows simple 1 step cues for 3x1 steps, then able to perform 1x3 steps. Wife attends class. Pt has adequate strength but does have baseline memory difficulty and very mild impulsivity, requiring supervision and safety cues. Pt does well in session, no limitations from pain or fatigue, returned to room, made comfortable, all needs met.    Follow Up Recommendations  Home health PT;Supervision for mobility/OOB     Equipment Recommendations  Wheelchair (measurements PT);Wheelchair cushion (measurements PT);Other (comment)    Recommendations for Other Services       Precautions / Restrictions Precautions Precautions: Fall Restrictions Weight Bearing Restrictions: Yes RLE Weight Bearing: Touchdown weight bearing    Mobility  Bed Mobility Overal bed mobility: Needs Assistance Bed Mobility: Supine to Sit     Supine to sit: Min assist     General bed mobility comments: wife assists with RLE OOB  Transfers Overall transfer level: Needs assistance Equipment used: Rolling walker (2 wheeled) Transfers: Sit to/from Stand Sit to Stand: Supervision         General transfer comment: requires safety cues due to memory deficits  Ambulation/Gait Ambulation/Gait assistance:  (deferred)                Stairs Stairs: Yes   Stair Management: Backwards;With walker;No rails Number of Stairs: 5 General stair comments: wife in attendance for education; wife texted youtube video for review, issued handout for RW setup   Wheelchair Mobility    Modified Rankin (Stroke Patients Only)       Balance Overall balance assessment: Modified Independent                                          Cognition Arousal/Alertness: Awake/alert Behavior During Therapy: WFL for tasks assessed/performed Overall Cognitive Status: Within Functional Limits for tasks assessed                                 General Comments: STM deficits      Exercises Other Exercises Other Exercises: Pt educated re: OT role, AE recs, falls prevention, safety mgmt, home/routines modifications, ECS Other Exercises: LBD, AE training    General Comments        Pertinent Vitals/Pain Pain Assessment: Faces Faces Pain Scale: Hurts little more Pain Location: R LE Pain Descriptors / Indicators: Aching Pain Intervention(s): Limited activity within patient's tolerance;Monitored during session;Repositioned    Home Living                      Prior Function            PT Goals (current goals can now be found in the care plan section) Acute  Rehab PT Goals Patient Stated Goal: DC to home PT Goal Formulation: With patient Time For Goal Achievement: 12/11/19 Potential to Achieve Goals: Good Progress towards PT goals: Progressing toward goals    Frequency    BID      PT Plan Current plan remains appropriate    Co-evaluation              AM-PAC PT "6 Clicks" Mobility   Outcome Measure  Help needed turning from your back to your side while in a flat bed without using bedrails?: A Little Help needed moving from lying on your back to sitting on the side of a flat bed without using bedrails?: A Little Help needed moving to and from a bed to a chair (including  a wheelchair)?: A Little Help needed standing up from a chair using your arms (e.g., wheelchair or bedside chair)?: A Little Help needed to walk in hospital room?: A Little Help needed climbing 3-5 steps with a railing? : A Little 6 Click Score: 18    End of Session Equipment Utilized During Treatment: Gait belt Activity Tolerance: Patient tolerated treatment well;No increased pain Patient left: in chair;with family/visitor present;with call bell/phone within reach;with chair alarm set Nurse Communication: Mobility status PT Visit Diagnosis: Unsteadiness on feet (R26.81);Difficulty in walking, not elsewhere classified (R26.2);Other abnormalities of gait and mobility (R26.89)     Time: 1683-7290 PT Time Calculation (min) (ACUTE ONLY): 23 min  Charges:  $Gait Training: 23-37 mins                     5:02 PM, 11/28/19 Eric Rose, PT, DPT Physical Therapist - [facility]  [phone] ([place])     Eric Rose 11/28/2019, 4:59 PM

## 2019-11-28 NOTE — Progress Notes (Signed)
Physical Therapy Treatment Patient Details Name: Eric Rose MRN: [id number] DOB: 18-Jan-1956 Today's Date: 11/28/2019    History of Present Illness Eric Rose is a 87yoM PMH: bilat THA, sustained a fall whilst playing outside with niece, later noted to have Rt periprosthetic hip fracture. Pt presents now s/p periprostheic ORIF Rt proximal femur, TDWB.    PT Comments    Pt in bed upon entry, wife not present for this session. Pt educated on more independent bed mobility strategies. Pt still requires safety cues for RW use in transfers but is starting to remember hand placement recommendations (partially). Pt AMB 7ft with RW, brief standing rest interval halfway, then sits to recover for 2 minutes. Pt then repeats AMB with progression to 73ft. Pt continues to have no difficulty with maintaining TDWB. Pt continues to be captivated by leg edema, asks several times each session about prognosis of swelling. Pt tolerates session well. Would benefit from 1 more stairs training session if possible prior to DC due to baseline memory deficits.    Follow Up Recommendations  Home health PT;Supervision for mobility/OOB     Equipment Recommendations  Wheelchair (measurements PT);Wheelchair cushion (measurements PT);Other (comment)    Recommendations for Other Services       Precautions / Restrictions Precautions Precautions: Fall Restrictions Weight Bearing Restrictions: Yes RLE Weight Bearing: Touchdown weight bearing    Mobility  Bed Mobility Overal bed mobility: Needs Assistance Bed Mobility: Supine to Sit;Sit to Supine     Supine to sit: Min assist Sit to supine: Supervision   General bed mobility comments: able to mobilize back into bed with foot/achilles hook technique  Transfers Overall transfer level: Needs assistance Equipment used: Rolling walker (2 wheeled) Transfers: Sit to/from Stand Sit to Stand: Supervision         General transfer comment: requires  safety cues due to memory deficits  Ambulation/Gait Ambulation/Gait assistance: Min guard Gait Distance (Feet): 80 Feet (45ft; then 63ft) Assistive device: Rolling walker (2 wheeled) Gait Pattern/deviations: Step-to pattern     General Gait Details: maintains TDWB; requires rest break halfway (standing break)     Wheelchair Mobility    Modified Rankin (Stroke Patients Only)       Balance Overall balance assessment: Modified Independent                                          Cognition Arousal/Alertness: Awake/alert Behavior During Therapy: WFL for tasks assessed/performed Overall Cognitive Status: Within Functional Limits for tasks assessed                                 General Comments: STM deficits      Exercises Other Exercises Other Exercises: Pt educated re: OT role, AE recs, falls prevention, safety mgmt, home/routines modifications, ECS Other Exercises: LBD, AE training    General Comments        Pertinent Vitals/Pain Pain Assessment: Faces Faces Pain Scale: Hurts little more Pain Location: R LE Pain Descriptors / Indicators: Aching Pain Intervention(s): Limited activity within patient's tolerance;Monitored during session;Repositioned    Home Living                      Prior Function            PT Goals (current goals can now be found in the  care plan section) Acute Rehab PT Goals Patient Stated Goal: DC to home PT Goal Formulation: With patient Time For Goal Achievement: 12/11/19 Potential to Achieve Goals: Good Progress towards PT goals: Progressing toward goals    Frequency    BID      PT Plan Current plan remains appropriate    Co-evaluation              AM-PAC PT "6 Clicks" Mobility   Outcome Measure  Help needed turning from your back to your side while in a flat bed without using bedrails?: A Little Help needed moving from lying on your back to sitting on the side of a flat  bed without using bedrails?: A Little Help needed moving to and from a bed to a chair (including a wheelchair)?: A Little Help needed standing up from a chair using your arms (e.g., wheelchair or bedside chair)?: A Little Help needed to walk in hospital room?: A Little Help needed climbing 3-5 steps with a railing? : A Little 6 Click Score: 18    End of Session Equipment Utilized During Treatment: Gait belt Activity Tolerance: Patient tolerated treatment well;Patient limited by fatigue;No increased pain Patient left: with call bell/phone within reach;in bed;with SCD's reapplied;with bed alarm set Nurse Communication: Mobility status PT Visit Diagnosis: Unsteadiness on feet (R26.81);Difficulty in walking, not elsewhere classified (R26.2);Other abnormalities of gait and mobility (R26.89)     Time: 0762-2633 PT Time Calculation (min) (ACUTE ONLY): 23 min  Charges:  $Gait Training: 23-37 mins                     5:11 PM, 11/28/19 Etta Grandchild, PT, DPT Physical Therapist - [facility]  [phone] ([place])    [place] C 11/28/2019, 5:05 PM

## 2019-11-28 NOTE — Discharge Instructions (Signed)

## 2019-11-28 NOTE — Care Management Important Message (Signed)
Important Message  Patient Details  Name: Eric Rose MRN: [id number] Date of Birth: 01-20-1956   Medicare Important Message Given:  Yes  Initial Medicare IM given by Patient Access Associate on 11/27/2019 at 8:56am.     Dannette Barbara 11/28/2019, 8:30 AM

## 2019-11-28 NOTE — Progress Notes (Addendum)
Subjective:  Patient reports pain as mild.    Objective:   VITALS:   Vitals:   11/27/19 0432 11/27/19 0812 11/27/19 1544 11/27/19 2335  BP: 125/72 116/85 127/83 124/76  Pulse: 97 82 95 94  Resp: 17 18 16 17   Temp: 98.5 F (36.9 C) 97.6 F (36.4 C) 98.2 F (36.8 C) 98.8 F (37.1 C)  TempSrc: Oral Oral Oral Oral  SpO2: 98% 96% 100% 99%  Weight:      Height:        PHYSICAL EXAM:  Neurologically intact ABD soft Neurovascular intact Sensation intact distally Intact pulses distally Dorsiflexion/Plantar flexion intact Incision: scant drainage No cellulitis present Compartment soft dressing changed  LABS  No results found for this or any previous visit (from the past 24 hour(s)).  DG C-Arm 1-60 Min  Result Date: 11/26/2019 CLINICAL DATA:  ORIF right femur fracture EXAM: RIGHT FEMUR 2 VIEWS; DG C-ARM 1-60 MIN COMPARISON:  None. FINDINGS: Seven low resolution intraoperative spot views of the right femur. Total fluoroscopy time was 48 seconds. The images demonstrate a right hip replacement with normal alignment. Subsequent placement of lateral fixating plate, multiple distal fixating screws, and placement of cerclage wires about the proximal shaft of the femur for periprosthetic fracture. IMPRESSION: Intraoperative fluoroscopic assistance provided during right femur fracture fixation. Electronically Signed   By: Donavan Foil M.D.   On: 11/26/2019 19:11   DG C-Arm 1-60 Min  Result Date: 11/26/2019 CLINICAL DATA:  ORIF right femur fracture EXAM: RIGHT FEMUR 2 VIEWS; DG C-ARM 1-60 MIN COMPARISON:  None. FINDINGS: Seven low resolution intraoperative spot views of the right femur. Total fluoroscopy time was 48 seconds. The images demonstrate a right hip replacement with normal alignment. Subsequent placement of lateral fixating plate, multiple distal fixating screws, and placement of cerclage wires about the proximal shaft of the femur for periprosthetic fracture. IMPRESSION:  Intraoperative fluoroscopic assistance provided during right femur fracture fixation. Electronically Signed   By: Donavan Foil M.D.   On: 11/26/2019 19:11   DG C-Arm 1-60 Min  Result Date: 11/26/2019 CLINICAL DATA:  ORIF right femur fracture EXAM: RIGHT FEMUR 2 VIEWS; DG C-ARM 1-60 MIN COMPARISON:  None. FINDINGS: Seven low resolution intraoperative spot views of the right femur. Total fluoroscopy time was 48 seconds. The images demonstrate a right hip replacement with normal alignment. Subsequent placement of lateral fixating plate, multiple distal fixating screws, and placement of cerclage wires about the proximal shaft of the femur for periprosthetic fracture. IMPRESSION: Intraoperative fluoroscopic assistance provided during right femur fracture fixation. Electronically Signed   By: Donavan Foil M.D.   On: 11/26/2019 19:11   DG FEMUR, MIN 2 VIEWS RIGHT  Result Date: 11/26/2019 CLINICAL DATA:  ORIF right femur fracture EXAM: RIGHT FEMUR 2 VIEWS; DG C-ARM 1-60 MIN COMPARISON:  None. FINDINGS: Seven low resolution intraoperative spot views of the right femur. Total fluoroscopy time was 48 seconds. The images demonstrate a right hip replacement with normal alignment. Subsequent placement of lateral fixating plate, multiple distal fixating screws, and placement of cerclage wires about the proximal shaft of the femur for periprosthetic fracture. IMPRESSION: Intraoperative fluoroscopic assistance provided during right femur fracture fixation. Electronically Signed   By: Donavan Foil M.D.   On: 11/26/2019 19:11    Assessment/Plan: 2 Days Post-Op   Active Problems:   Periprosthetic fracture around internal prosthetic right hip joint ([place])   Advance diet Up with therapy, TDWB RLE Discharge home with home health PT Follow up in 2 weeks for  staple removal Call for appt [phone] Lovenox switch to aspirin twice a day upon discharge Continue pain control   Carlynn Spry , PA-C 11/28/2019,  7:55 AM

## 2019-11-28 NOTE — Progress Notes (Signed)
Occupational Therapy Treatment Patient Details Name: Eric Rose MRN: [id number] DOB: Mar 12, 1956 Today's Date: 11/28/2019    History of present illness Javarus Dorner is a 12yoM PMH: bilat THA, sustained a fall whilst playing outside with niece, later noted to have Rt periprosthetic hip fracture. Pt presents now s/p periprostheic ORIF Rt proximal femur, TDWB.   OT comments  Mr Wicklund was seen for OT treatment on this date. Upon arrival to room pt reclined in bed reporting recently completing walk in hall c PT. Pt agreeable to bed level LB AE training. Pt currently requires MOD A for LB access at bed level. MOD I to adjust torso and shift hips back in bed using rails. Pt continues to demonstrate STM deficits requiring MIN VCs to problem solve safety scenarios. Pt verbalized understanding of instruction provided. Pt making good progress toward goals. Pt continues to benefit from skilled OT services to maximize return to PLOF and minimize risk of future falls, injury, caregiver burden, and readmission. Will continue to follow POC. Discharge recommendation remains appropriate.    Follow Up Recommendations  Home health OT;Supervision/Assistance - 24 hour    Equipment Recommendations  3 in 1 bedside commode;Tub/shower seat;Other (comment) (grab bars in restroom, AE for LB ADLs)    Recommendations for Other Services      Precautions / Restrictions Precautions Precautions: Fall Restrictions Weight Bearing Restrictions: Yes RLE Weight Bearing: Touchdown weight bearing       Mobility Bed Mobility Overal bed mobility: Needs Assistance      General bed mobility comments: MOD I adjust torso and shift hips back in bed   Transfers  General transfer comment: Pt defered stating recently completed walking c PT        ADL either performed or assessed with clinical judgement   ADL Overall ADL's : Needs assistance/impaired      General ADL Comments: MOD A for LBD at bed level. VCs  for AE [place]               Cognition Arousal/Alertness: Awake/alert Behavior During Therapy: Restless Overall Cognitive Status: History of cognitive impairments - at baseline      General Comments: STM deficits        Exercises Exercises: Other exercises Other Exercises Other Exercises: Pt educated re: OT role, AE recs, falls prevention, safety mgmt, home/routines modifications, ECS Other Exercises: LBD, AE training           Pertinent Vitals/ Pain       Pain Assessment: Faces Faces Pain Scale: Hurts little more Pain Location: R LE Pain Descriptors / Indicators: Aching Pain Intervention(s): Monitored during session;Limited activity within patient's tolerance         Frequency  Min 1X/week        Progress Toward Goals  OT Goals(current goals can now be found in the care plan section)  Progress towards OT goals: Progressing toward goals  Acute Rehab OT Goals Patient Stated Goal: DC to home OT Goal Formulation: With patient/family Time For Goal Achievement: 12/11/19 Potential to Achieve Goals: Good ADL Goals Pt Will Perform Lower Body Dressing: with min guard assist;with adaptive equipment;sit to/from stand Pt Will Transfer to Toilet: with min guard assist;ambulating;bedside commode (BSC over standard commode in restroom, LRAD for amb) Pt Will Perform Toileting - Clothing Manipulation and hygiene: with supervision;sit to/from stand (grab bars)  Plan Discharge plan remains appropriate;Frequency remains appropriate       AM-PAC OT "6 Clicks" Daily Activity     Outcome Measure  Help from another person eating meals?: None Help from another person taking care of personal grooming?: A Little Help from another person toileting, which includes using toliet, bedpan, or urinal?: A Little Help from another person bathing (including washing, rinsing, drying)?: A Lot Help from another person to put on and taking off regular upper body clothing?: A Little Help from  another person to put on and taking off regular lower body clothing?: A Lot 6 Click Score: 17    End of Session    OT Visit Diagnosis: Unsteadiness on feet (R26.81);Other abnormalities of gait and mobility (R26.89);Muscle weakness (generalized) (M62.81);History of falling (Z91.81)   Activity Tolerance Patient tolerated treatment well   Patient Left in bed;with call bell/phone within reach;with bed alarm set   Nurse Communication          Time: 0919-8022 OT Time Calculation (min): 10 min  Charges: OT General Charges $OT Visit: 1 Visit OT Treatments $Self Care/Home Management : 8-22 mins  Dessie Coma, M.S. OTR/L  11/28/19, 3:57 PM  ascom [phone]

## 2019-11-29 NOTE — Discharge Summary (Signed)
Physician Discharge Summary  Patient ID: Eric Rose MRN: [id number] DOB/AGE: February 05, 1956 64 y.o.  Admit date: 11/26/2019 Discharge date: 11/29/2019  Admission Diagnoses: Periprosthetic fracture right femur right at tip of total hip  Discharge Diagnoses: Same Active Problems:   Periprosthetic fracture around internal prosthetic right hip joint [facility])   Discharged Condition: good  Hospital Course: The patient underwent surgery 11/26/2019 to repair the periprosthetic fracture of the right total hip. He did well postoperatively he made good progress with physical therapy. He is ready for discharge home with home health PT today. He was seen in the office in 10 days for follow-up. He will remain touchdown weightbearing. Medications have been sent to the pharmacy.  Consults: None  Significant Diagnostic Studies: radiology: X-Ray: Satisfactory plating of the right proximal femur  Treatments: antibiotics: Ancef  Discharge Exam: Blood pressure 115/76, pulse 90, temperature 97.9 F (36.6 C), resp. rate 18, height 5\' 11"  (1.803 m), weight 92.4 kg, SpO2 100 %. Dressings are dry. Leg lengths are good. Range of motion of the hip is satisfactory. He is alert and cooperative.  Disposition: Discharge disposition: 01-Home or Self Care       Discharge Instructions    Call MD for:  persistant nausea and vomiting   Complete by: As directed    Call MD for:  redness, tenderness, or signs of infection (pain, swelling, redness, odor or green/yellow discharge around incision site)   Complete by: As directed    Call MD for:  severe uncontrolled pain   Complete by: As directed    Call MD for:  temperature >100.4   Complete by: As directed    Diet - low sodium heart healthy   Complete by: As directed    Increase activity slowly   Complete by: As directed    Leave dressing on - Keep it clean, dry, and intact until clinic visit   Complete by: As directed      Allergies as of 11/29/2019   No  Known Allergies     Medication List    STOP taking these medications   meloxicam 15 MG tablet Commonly known as: MOBIC     TAKE these medications   acetaminophen 500 MG tablet Commonly known as: TYLENOL Take 500-1,000 mg by mouth daily.   amoxicillin 500 MG capsule Commonly known as: AMOXIL Take 500 mg by mouth once.   aspirin EC 81 MG tablet Take 1 tablet (81 mg total) by mouth daily. What changed: Another medication with the same name was added. Make sure you understand how and when to take each.   aspirin EC 81 MG tablet Take 1 tablet (81 mg total) by mouth 2 (two) times daily. Swallow whole. What changed: You were already taking a medication with the same name, and this prescription was added. Make sure you understand how and when to take each.   docusate sodium 100 MG capsule Commonly known as: COLACE Take 1 capsule (100 mg total) by mouth 2 (two) times daily.   donepezil 5 MG tablet Commonly known as: ARICEPT Take 5 mg by mouth daily with supper.   HYDROcodone-acetaminophen 5-325 MG tablet Commonly known as: NORCO/VICODIN Take 1 tablet by mouth every 4 (four) hours as needed for moderate pain (pain).   lisinopril 20 MG tablet Commonly known as: ZESTRIL Take 1 tablet (20 mg total) by mouth daily.   memantine 5 MG tablet Commonly known as: NAMENDA Take 5 mg by mouth 2 (two) times daily.   multivitamin capsule Take 1  capsule by mouth daily. 50 +   pravastatin 20 MG tablet Commonly known as: PRAVACHOL Take 1 tablet (20 mg total) by mouth at bedtime.   tadalafil 10 MG tablet Commonly known as: CIALIS TAKE 1 TABLET BY MOUTH EVERY DAY FOR ERECTILE DYSFUNCTION. What changed:   how much to take  how to take this  when to take this  reasons to take this  additional instructions   VITAMIN B-12 PO Take 1 tablet by mouth daily.   VITAMIN D (ERGOCALCIFEROL) PO Take 1,000 mg by mouth daily.   Vitamin E 400 units Tabs Take 400 mg by mouth daily.             Durable Medical Equipment  (From admission, onward)         Start     Ordered   11/28/19 1627  For home use only DME 3 n 1  Once        11/28/19 1628   11/28/19 1627  For home use only DME Walker rolling  Once       Question Answer Comment  Walker: With Shorter Wheels   Patient needs a walker to treat with the following condition Hip fracture ([place])      11/28/19 1628           Discharge Care Instructions  (From admission, onward)         Start     Ordered   11/29/19 0000  Leave dressing on - Keep it clean, dry, and intact until clinic visit        11/29/19 1400         Remain toe-touch weightbearing right leg Enteric-coated aspirin twice a day for DVT prophylaxis Return to office in 10 days for exam and x-rays to see Dr. Harlow Mares  Signed: Park Breed 11/29/2019, 2:00 PM

## 2019-11-29 NOTE — Progress Notes (Signed)
DISCHARGE NOTE:  Pt and wife given discharge instructions. Pt verbalized understanding. Pt wheeled to car by staff.

## 2019-11-29 NOTE — TOC Transition Note (Signed)
Transition of Care [facility]) - CM/SW Discharge Note   Patient Details  Name: FRANKLIN BAUMBACH MRN: [id number] Date of Birth: 09-27-1955  [facility]) CM/SW Contact:  Boris Sharper, LCSW Phone Number: 11/29/2019, 2:37 PM   Clinical Narrative:    Pt medically stable for discharge per MD. Pt will be transported home by his spouse [name]. Pt will be followed for [facility] PT by [facility]. CSW notified Tillie Rung with well care of pt's discharge.   Final next level of care: Home w Home Health Services Barriers to Discharge: No Barriers Identified   Patient Goals and CMS Choice     Choice offered to / list presented to : Spouse  Discharge Placement                Patient to be transferred to facility by: spouse Name of family member notified: vanessa Patient and family notified of of transfer: 11/29/19  Discharge Plan and Services   Discharge Planning Services: CM Consult Post Acute Care Choice: Home Health                    HH Arranged: PT, OT [facility]: [facility] Date [place]: 11/29/19 Time [place]: [place] Representative spoke with at [place]: [place] ([place]) Interventions     Readmission Risk Interventions No flowsheet data found.

## 2019-11-29 NOTE — Progress Notes (Signed)
Physical Therapy Treatment Patient Details Name: Eric Rose MRN: [id number] DOB: 1955/12/06 Today's Date: 11/29/2019    History of Present Illness Eric Rose is a 30yoM PMH: bilat THA, sustained a fall whilst playing outside with niece, later noted to have Rt periprosthetic hip fracture. Pt presents now s/p periprostheic ORIF Rt proximal femur, TDWB.    PT Comments    Pt was in recliner alert and oriented x 3. Does present with some cognition deficits but unsure if this is baseline. Awaiting wife to arrive to discuss. He was able to properly state wt bearing and is extremely cooperative and pleasant. Motivated to improve and get home. He was able to safely stand to RW and ambulate while adhering to proper TDWB. No LOB or unsteadiness. Pt did perform stair training previous date. Will return when wife is present to review and perform later this date. Plan is to DC home with HHPT to follow once ruled medically stable. He will benefit form continued skilled PT to address strength, balance, and safe functional mobility deficits. At conclusion of session, pt was in recliner with chair alarm in place, SCDs reapplied, and call bell in reach.    Follow Up Recommendations  Home health PT;Supervision for mobility/OOB     Equipment Recommendations  Wheelchair (measurements PT);Wheelchair cushion (measurements PT);Other (comment)    Recommendations for Other Services       Precautions / Restrictions Precautions Precautions: Fall Restrictions Weight Bearing Restrictions: Yes RLE Weight Bearing: Touchdown weight bearing    Mobility  Bed Mobility    General bed mobility comments: pt was in recliner pre/post session  Transfers Overall transfer level: Needs assistance Equipment used: Rolling walker (2 wheeled) Transfers: Sit to/from Stand Sit to Stand: Supervision         General transfer comment: Pt was able to stand and sit without physical assistance. Was cued for improved  technique however pt is safe  Ambulation/Gait Ambulation/Gait assistance: Supervision Gait Distance (Feet): 120 Feet Assistive device: Rolling walker (2 wheeled) Gait Pattern/deviations: Step-to pattern Gait velocity: decreased   General Gait Details: Pt was able to ambulate with step to pattern while performing proper wt bearing. requires alot of vcs for improved technique and sequencing. Tolerated well without c/o fatigue or discomfort.   Rose         General stair comments: Will return when wife is present to review/retry stair navigation. Did perform Rose previous day.      Balance Overall balance assessment: Modified Independent   Sitting balance-Leahy Scale: Good       Standing balance-Leahy Scale: Good Standing balance comment: reliant on BUE for support however no LOB or unsteadiness       Cognition Arousal/Alertness: Awake/alert Behavior During Therapy: WFL for tasks assessed/performed Overall Cognitive Status: No family/caregiver present to determine baseline cognitive functioning      General Comments: Pt is A and O x 3. Agreeable to PT session and extremely pleasant but does have memory/cognition deficits. was able to correctly state proper wt bearing.             Pertinent Vitals/Pain Pain Assessment: No/denies pain Faces Pain Scale: Hurts a little bit Pain Location: R LE Pain Descriptors / Indicators: Sore Pain Intervention(s): Limited activity within patient's tolerance;Monitored during session;Repositioned           PT Goals (current goals can now be found in the care plan section) Acute Rehab PT Goals Patient Stated Goal: DC to home Progress towards PT goals: Progressing toward goals  Frequency    BID      PT Plan Current plan remains appropriate       AM-PAC PT "6 Clicks" Mobility   Outcome Measure  Help needed turning from your back to your side while in a flat bed without using bedrails?: A Little Help needed moving  from lying on your back to sitting on the side of a flat bed without using bedrails?: A Little Help needed moving to and from a bed to a chair (including a wheelchair)?: A Little Help needed standing up from a chair using your arms (e.g., wheelchair or bedside chair)?: A Little Help needed to walk in hospital room?: A Little Help needed climbing 3-5 steps with a railing? : A Little 6 Click Score: 18    End of Session Equipment Utilized During Treatment: Gait belt Activity Tolerance: Patient tolerated treatment well;No increased pain Patient left: in chair;with call bell/phone within reach;with chair alarm set;with SCD's reapplied Nurse Communication: Mobility status PT Visit Diagnosis: Unsteadiness on feet (R26.81);Difficulty in walking, not elsewhere classified (R26.2);Other abnormalities of gait and mobility (R26.89)     Time: 8325-4982 PT Time Calculation (min) (ACUTE ONLY): 26 min  Charges:  $Gait Training: 8-22 mins $Therapeutic Activity: 8-22 mins                     Julaine Fusi PTA 11/29/19, 9:27 AM

## 2019-11-29 NOTE — Progress Notes (Signed)
PT  Note  Patient Details Name: Eric Rose MRN: [id number] DOB: 31-Oct-1955   Notified by RN that pt's spouse was present in patient room. Discussed with patient and spouse upcoming DC and both pt and spouse feel confident in abilities to safely return home with HHPT to follow. Discussed stair navigation that was performed previous date. Wife states she feels they can safely manage  getting in/out of home safely while adhering to wt bearing precautions. Pt is cleared from PT standpoint for safe DC home once deemed medically stable.        Willette Pa 11/29/2019, 11:13 AM

## 2019-12-01 DIAGNOSIS — Z7982 Long term (current) use of aspirin: Secondary | ICD-10-CM | POA: Diagnosis not present

## 2019-12-01 DIAGNOSIS — M9701XD Periprosthetic fracture around internal prosthetic right hip joint, subsequent encounter: Secondary | ICD-10-CM | POA: Diagnosis not present

## 2019-12-01 DIAGNOSIS — Z9181 History of falling: Secondary | ICD-10-CM | POA: Diagnosis not present

## 2019-12-01 DIAGNOSIS — M1990 Unspecified osteoarthritis, unspecified site: Secondary | ICD-10-CM | POA: Diagnosis not present

## 2019-12-01 DIAGNOSIS — F0390 Unspecified dementia without behavioral disturbance: Secondary | ICD-10-CM | POA: Diagnosis not present

## 2019-12-01 DIAGNOSIS — S72001D Fracture of unspecified part of neck of right femur, subsequent encounter for closed fracture with routine healing: Secondary | ICD-10-CM | POA: Diagnosis not present

## 2019-12-05 DIAGNOSIS — Z7982 Long term (current) use of aspirin: Secondary | ICD-10-CM | POA: Diagnosis not present

## 2019-12-05 DIAGNOSIS — M1990 Unspecified osteoarthritis, unspecified site: Secondary | ICD-10-CM | POA: Diagnosis not present

## 2019-12-05 DIAGNOSIS — M9701XD Periprosthetic fracture around internal prosthetic right hip joint, subsequent encounter: Secondary | ICD-10-CM | POA: Diagnosis not present

## 2019-12-05 DIAGNOSIS — F0390 Unspecified dementia without behavioral disturbance: Secondary | ICD-10-CM | POA: Diagnosis not present

## 2019-12-05 DIAGNOSIS — S72001D Fracture of unspecified part of neck of right femur, subsequent encounter for closed fracture with routine healing: Secondary | ICD-10-CM | POA: Diagnosis not present

## 2019-12-05 DIAGNOSIS — Z9181 History of falling: Secondary | ICD-10-CM | POA: Diagnosis not present

## 2019-12-09 DIAGNOSIS — M9701XA Periprosthetic fracture around internal prosthetic right hip joint, initial encounter: Secondary | ICD-10-CM | POA: Diagnosis not present

## 2019-12-12 DIAGNOSIS — Z7982 Long term (current) use of aspirin: Secondary | ICD-10-CM | POA: Diagnosis not present

## 2019-12-12 DIAGNOSIS — M9701XD Periprosthetic fracture around internal prosthetic right hip joint, subsequent encounter: Secondary | ICD-10-CM | POA: Diagnosis not present

## 2019-12-12 DIAGNOSIS — S72001D Fracture of unspecified part of neck of right femur, subsequent encounter for closed fracture with routine healing: Secondary | ICD-10-CM | POA: Diagnosis not present

## 2019-12-12 DIAGNOSIS — Z9181 History of falling: Secondary | ICD-10-CM | POA: Diagnosis not present

## 2019-12-12 DIAGNOSIS — M1990 Unspecified osteoarthritis, unspecified site: Secondary | ICD-10-CM | POA: Diagnosis not present

## 2019-12-12 DIAGNOSIS — F0390 Unspecified dementia without behavioral disturbance: Secondary | ICD-10-CM | POA: Diagnosis not present

## 2019-12-18 ENCOUNTER — Telehealth: Payer: Self-pay

## 2019-12-18 NOTE — Telephone Encounter (Signed)
Copied from [place] [phone]. Topic: General - Inquiry >> Dec 18, 2019  1:22 PM Gillis Ends D wrote: Reason for CRM: Patients wife called to let Dr. Roxan Hockey know that he fell and broke his right leg, up by the femur. He also had to have surgery on it to put pins in it and put it back together. She wanted to inform you on this situation. Please advise

## 2019-12-19 NOTE — Telephone Encounter (Signed)
Responding with a sorry to hear about this, and thank you for letting us know is best. Thanks

## 2019-12-22 ENCOUNTER — Telehealth: Payer: Self-pay | Admitting: Urology

## 2019-12-22 NOTE — Telephone Encounter (Signed)
Would you have Eric Rose reschedule his missed appointment in September for PSA, I PSS and exam?  He fractured his femur and that's why he missed the appointment.

## 2020-01-13 DIAGNOSIS — M9701XA Periprosthetic fracture around internal prosthetic right hip joint, initial encounter: Secondary | ICD-10-CM | POA: Diagnosis not present

## 2020-01-15 DIAGNOSIS — Z7982 Long term (current) use of aspirin: Secondary | ICD-10-CM | POA: Diagnosis not present

## 2020-01-15 DIAGNOSIS — M9701XD Periprosthetic fracture around internal prosthetic right hip joint, subsequent encounter: Secondary | ICD-10-CM | POA: Diagnosis not present

## 2020-01-15 DIAGNOSIS — M1990 Unspecified osteoarthritis, unspecified site: Secondary | ICD-10-CM | POA: Diagnosis not present

## 2020-01-15 DIAGNOSIS — S72001D Fracture of unspecified part of neck of right femur, subsequent encounter for closed fracture with routine healing: Secondary | ICD-10-CM | POA: Diagnosis not present

## 2020-01-15 DIAGNOSIS — F0390 Unspecified dementia without behavioral disturbance: Secondary | ICD-10-CM | POA: Diagnosis not present

## 2020-01-15 DIAGNOSIS — Z9181 History of falling: Secondary | ICD-10-CM | POA: Diagnosis not present

## 2020-01-19 ENCOUNTER — Encounter: Payer: Self-pay | Admitting: Urology

## 2020-01-19 ENCOUNTER — Other Ambulatory Visit: Payer: PPO

## 2020-01-20 NOTE — Progress Notes (Signed)
01/21/2020  3:45 PM   Eric Rose January 26, 1956 [id number]  Referring provider: Towanda Malkin, MD [address] [place],  [place] [address]  Chief Complaint  Patient presents with   Benign Prostatic Hypertrophy   HPI: Patient is a 64 year old male with mild dementia, a history of an elevated PSA and  BPH with LUTS presents today for 6 month follow up with his wife, Eric Rose.    PSA Elevation Component     Latest Ref Rng & Units 12/29/2014 01/12/2015 04/08/2015 07/29/2015  Prostate Specific Ag, Serum     0.0 - 4.0 ng/mL 4.8 (H) 3.6 2.5 3.5   Component     Latest Ref Rng & Units 08/23/2015 10/03/2016 03/29/2017 11/15/2017  Prostate Specific Ag, Serum     0.0 - 4.0 ng/mL 4.9 (H) 2.6 1.5 0.8   Component     Latest Ref Rng & Units 05/16/2018 11/22/2018 03/19/2019 05/23/2019  Prostate Specific Ag, Serum     0.0 - 4.0 ng/mL 1.5 2.4 2.4 2.7  Patient had been on Jalyn in the past, but he had discontinued the medication sometime after is appointment in 11/2017.    BPH WITH LUTS  (prostate and/or bladder) IPSS score: 11/3  PVR: 69mL  Previous score: 9/2   PVR: 62 mL  Major complaint(s): He found that when he reduces his caffeine consumption, his nocturia improves greatly.    He continues to use his CPAP machine.  Patient denies any modifying or aggravating factors.  Patient denies any gross hematuria, dysuria or suprapubic/flank pain.  Patient denies any fevers, chills, nausea or vomiting.   His father, brother and paternal uncle have been diagnosed with prostate cancer.     IPSS    Row Name 01/21/20 1500         International Prostate Symptom Score   How often have you had the sensation of not emptying your bladder? Less than half the time     How often have you had to urinate less than every two hours? Less than 1 in 5 times     How often have you found you stopped and started again several times when you urinated? Not at All     How often have you found it  difficult to postpone urination? Less than half the time     How often have you had a weak urinary stream? Less than half the time     How often have you had to strain to start urination? Less than 1 in 5 times     How many times did you typically get up at night to urinate? 3 Times     Total IPSS Score 11       Quality of Life due to urinary symptoms   If you were to spend the rest of your life with your urinary condition just the way it is now how would you feel about that? 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   Around  2000, damage to left eye (traumatic, playing tennis)    Surgical History: Past Surgical History:  Procedure Laterality Date   JOINT REPLACEMENT Bilateral    hip   ORIF FEMUR FRACTURE Right 11/26/2019   Procedure: OPEN REDUCTION INTERNAL FIXATION (ORIF) proximal FEMUR FRACTURE;  Surgeon: Lovell Sheehan, MD;  Location: [facility] ORS;  Service: Orthopedics;  Laterality: Right;   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   Procedure: TOTAL HIP ARTHROPLASTY;  Surgeon: Earnestine Leys, MD;  Location: [facility] ORS;  Service: Orthopedics;  Laterality: Left;    Home Medications:  Allergies as of 01/21/2020   No Known Allergies     Medication List       Accurate as of January 21, 2020  3:45 PM. If you have any questions, ask your nurse or doctor.        acetaminophen 500 MG tablet Commonly known as: TYLENOL Take 500-1,000 mg by mouth daily.   amoxicillin 500 MG capsule Commonly known as: AMOXIL Take 500 mg by mouth once.   aspirin EC 81 MG tablet Take 1 tablet (81 mg  total) by mouth daily.   aspirin EC 81 MG tablet Take 1 tablet (81 mg total) by mouth 2 (two) times daily. Swallow whole.   docusate sodium 100 MG capsule Commonly known as: COLACE Take 1 capsule (100 mg total) by mouth 2 (two) times daily.   donepezil 5 MG tablet Commonly known as: ARICEPT Take 5 mg by mouth daily with supper.   HYDROcodone-acetaminophen 5-325 MG tablet Commonly known as: NORCO/VICODIN Take 1 tablet by mouth every 4 (four) hours as needed for moderate pain (pain).   lisinopril 20 MG tablet Commonly known as: ZESTRIL Take 1 tablet (20 mg total) by mouth daily.   memantine 5 MG tablet Commonly known as: NAMENDA Take 5 mg by mouth 2 (two) times daily.   multivitamin capsule Take 1 capsule by mouth daily. 50 +   pravastatin 20 MG tablet Commonly known as: PRAVACHOL Take 1 tablet (20 mg total) by mouth at bedtime.   tadalafil 10 MG tablet Commonly known as: CIALIS TAKE 1 TABLET BY MOUTH EVERY DAY FOR ERECTILE DYSFUNCTION.   VITAMIN B-12 PO Take 1 tablet by mouth daily.   VITAMIN D (ERGOCALCIFEROL) PO Take 1,000 mg by mouth daily.   Vitamin E 400 units Tabs Take 400 mg by mouth daily.       Allergies: No Known Allergies  Family History: Family History  Problem Relation Age of Onset   Diabetes Mother    Heart disease Mother    Alzheimer's disease Mother    Prostate cancer Father    Prostate cancer Brother    Prostate cancer Paternal Uncle    Bladder Cancer Neg Hx    Kidney cancer Neg Hx     Social History:  reports that he has never smoked. He has never used smokeless tobacco. He reports that he does not drink alcohol and does not use drugs.  ROS: For pertinent review of systems please refer to history of present illness  Physical Exam: BP (!) 201/69 (BP Location: Left Arm, Patient Position: Sitting, Cuff Size: Normal)    Pulse 90    Ht 5\' 10"  (1.778 m)    Wt 184 lb (83.5 kg)    BMI 26.40 kg/m   Constitutional:  Well nourished.  Alert and oriented, No acute distress. HEENT: [place] AT, mask in place.  Trachea midline Cardiovascular: No clubbing, cyanosis, or edema. Respiratory: Normal respiratory effort, no increased work of breathing.  GU: No CVA tenderness.  No bladder fullness or masses.  Patient with uncircumcised phallus.  Foreskin easily retracted   Urethral meatus is patent.  No penile discharge. No penile lesions or rashes. Scrotum without lesions, cysts, rashes and/or edema.  Testicles are located scrotally bilaterally. No masses are appreciated in the testicles. Left and right epididymis are normal. Rectal: Patient with  normal sphincter tone. Anus and perineum without scarring or rashes. No rectal masses are appreciated. Prostate is approximately 60 + grams, could only palpate apex, no nodules are appreciated. Seminal vesicles could not be palpated.  Neurologic: Grossly intact, no focal deficits, moving all 4 extremities. Psychiatric: Normal mood and affect.   Laboratory Data: See HPI Urinalysis Component     Latest Ref Rng & Units 01/21/2020  Specific Gravity, UA     1.005 - 1.030 1.025  pH, UA     5.0 - 7.5 6.5  Color, UA     Yellow Yellow  Appearance Ur     Clear Clear  Leukocytes,UA     Negative Negative  Protein,UA     Negative/Trace Negative  Glucose, UA     Negative Negative  Ketones, UA     Negative Negative  RBC, UA     Negative Negative  Bilirubin, UA     Negative Negative  Urobilinogen, Ur     0.2 - 1.0 mg/dL 1.0  Nitrite, UA     Negative Negative  Microscopic Examination      See below:   Component     Latest Ref Rng & Units 01/21/2020  WBC, UA     0 - 5 /hpf 0-5  RBC     0 - 2 /hpf 0-2  Epithelial Cells (non renal)     0 - 10 /hpf None seen  Bacteria, UA     None seen/Few None seen   I have reviewed the labs  Pertinent imaging Results for ELAINE, MIDDLETON (MRN [id number]) as of 01/21/2020 15:29  Ref. Range 01/21/2020 15:16  Scan Result Unknown 85mL    Assessment  & Plan:    1. BPH with LUTS IPSS score is 11/3, it is worse Continue conservative management, avoiding bladder irritants and timed voiding's He is avoiding caffeine at lunch and dinner and this is assisting and decreasing nocturia PSA pending from today RTC in 6 months for IPSS, PSA and exam - if PSA is stable  2. History of elevated PSA PSA stable RTC in 6 months for PSA   Return in about 6 months (around 07/20/2020) for IPSS, PVR, PSA and exam.  Zara Council, [facility]  [place], [place] [address] [phone]

## 2020-01-21 ENCOUNTER — Encounter: Payer: Self-pay | Admitting: Urology

## 2020-01-21 ENCOUNTER — Ambulatory Visit (INDEPENDENT_AMBULATORY_CARE_PROVIDER_SITE_OTHER): Payer: PPO | Admitting: Urology

## 2020-01-21 ENCOUNTER — Other Ambulatory Visit: Payer: Self-pay

## 2020-01-21 VITALS — BP 201/69 | HR 90 | Ht 70.0 in | Wt 184.0 lb

## 2020-01-21 DIAGNOSIS — N138 Other obstructive and reflux uropathy: Secondary | ICD-10-CM | POA: Diagnosis not present

## 2020-01-21 DIAGNOSIS — N401 Enlarged prostate with lower urinary tract symptoms: Secondary | ICD-10-CM | POA: Diagnosis not present

## 2020-01-21 DIAGNOSIS — R972 Elevated prostate specific antigen [PSA]: Secondary | ICD-10-CM | POA: Diagnosis not present

## 2020-01-21 LAB — BLADDER SCAN AMB NON-IMAGING

## 2020-01-22 ENCOUNTER — Telehealth: Payer: Self-pay | Admitting: Family Medicine

## 2020-01-22 DIAGNOSIS — S72001D Fracture of unspecified part of neck of right femur, subsequent encounter for closed fracture with routine healing: Secondary | ICD-10-CM | POA: Diagnosis not present

## 2020-01-22 DIAGNOSIS — M1990 Unspecified osteoarthritis, unspecified site: Secondary | ICD-10-CM | POA: Diagnosis not present

## 2020-01-22 DIAGNOSIS — Z9181 History of falling: Secondary | ICD-10-CM | POA: Diagnosis not present

## 2020-01-22 DIAGNOSIS — M9701XD Periprosthetic fracture around internal prosthetic right hip joint, subsequent encounter: Secondary | ICD-10-CM | POA: Diagnosis not present

## 2020-01-22 DIAGNOSIS — Z7982 Long term (current) use of aspirin: Secondary | ICD-10-CM | POA: Diagnosis not present

## 2020-01-22 DIAGNOSIS — F0390 Unspecified dementia without behavioral disturbance: Secondary | ICD-10-CM | POA: Diagnosis not present

## 2020-01-22 LAB — URINALYSIS, COMPLETE
Bilirubin, UA: NEGATIVE
Glucose, UA: NEGATIVE
Ketones, UA: NEGATIVE
Leukocytes,UA: NEGATIVE
Nitrite, UA: NEGATIVE
Protein,UA: NEGATIVE
RBC, UA: NEGATIVE
Specific Gravity, UA: 1.025 (ref 1.005–1.030)
Urobilinogen, Ur: 1 mg/dL (ref 0.2–1.0)
pH, UA: 6.5 (ref 5.0–7.5)

## 2020-01-22 LAB — MICROSCOPIC EXAMINATION
Bacteria, UA: NONE SEEN
Epithelial Cells (non renal): NONE SEEN /hpf (ref 0–10)

## 2020-01-22 LAB — PSA: Prostate Specific Ag, Serum: 2.5 ng/mL (ref 0.0–4.0)

## 2020-01-22 NOTE — Telephone Encounter (Signed)
-----   Message from Nori Riis, PA-C sent at 01/22/2020  7:44 AM EST ----- Please let Eric Rose know that his PSA remains stable at 2.5 and we will see him in May.

## 2020-01-22 NOTE — Telephone Encounter (Signed)
Patient notified and voiced understanding.

## 2020-01-27 ENCOUNTER — Telehealth: Payer: Self-pay | Admitting: Internal Medicine

## 2020-01-27 DIAGNOSIS — M25651 Stiffness of right hip, not elsewhere classified: Secondary | ICD-10-CM | POA: Diagnosis not present

## 2020-01-27 DIAGNOSIS — M25551 Pain in right hip: Secondary | ICD-10-CM | POA: Diagnosis not present

## 2020-01-27 NOTE — Telephone Encounter (Signed)
Copied from [place] [phone]. Topic: Medicare AWV >> Jan 27, 2020 12:56 PM Cher Nakai R wrote: Reason for CRM:  Left message for patient to call back and schedule Medicare Annual Wellness Visit (AWV) in office.   If not able to come in office, please offer to do virtually.   No hx of AWV  eligible as of 12/12/2019   Please schedule at anytime with [facility].      40 Minutes appointment   Any questions, please call me at [phone]

## 2020-02-03 DIAGNOSIS — M25551 Pain in right hip: Secondary | ICD-10-CM | POA: Diagnosis not present

## 2020-02-03 DIAGNOSIS — M25651 Stiffness of right hip, not elsewhere classified: Secondary | ICD-10-CM | POA: Diagnosis not present

## 2020-02-06 IMAGING — US US BIOPSY LYMPH NODE
1 series · 13 of 18 positions shown · non-contrast
Comparison: none

INDICATION: Chronic enlarged left submandibular lymph node.

[Series 1: us biopsy lymph node · 18 acquisitions, 13 frames shown]
[im 1/18]
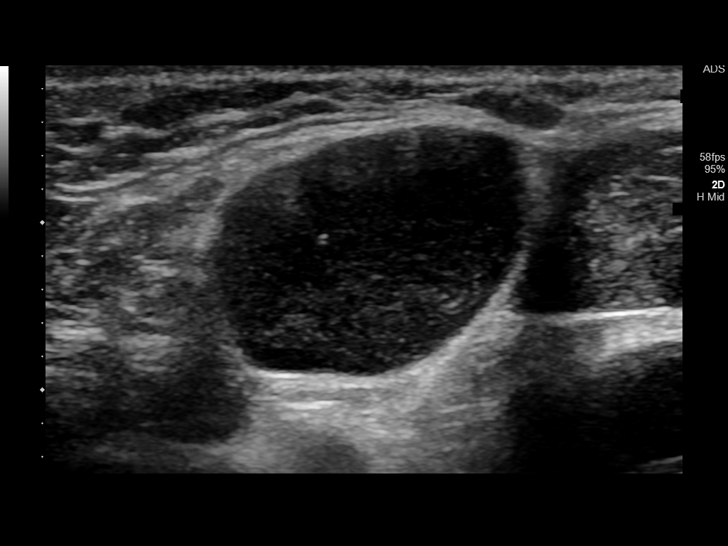
[im 3/18]
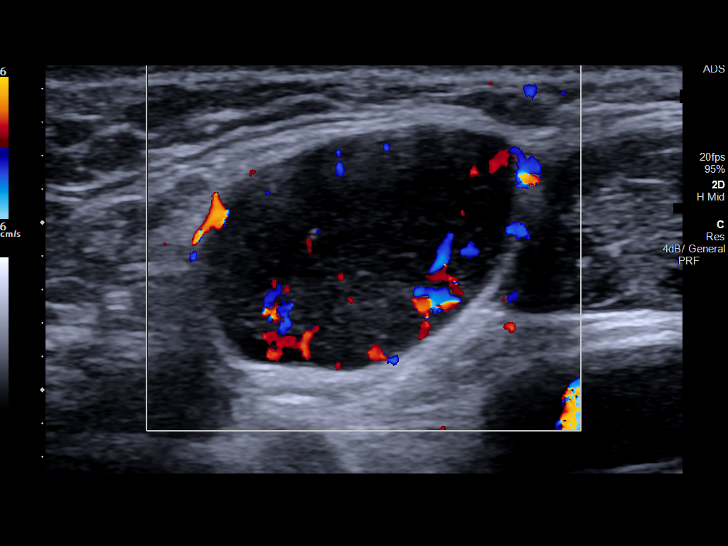
[im 4/18]
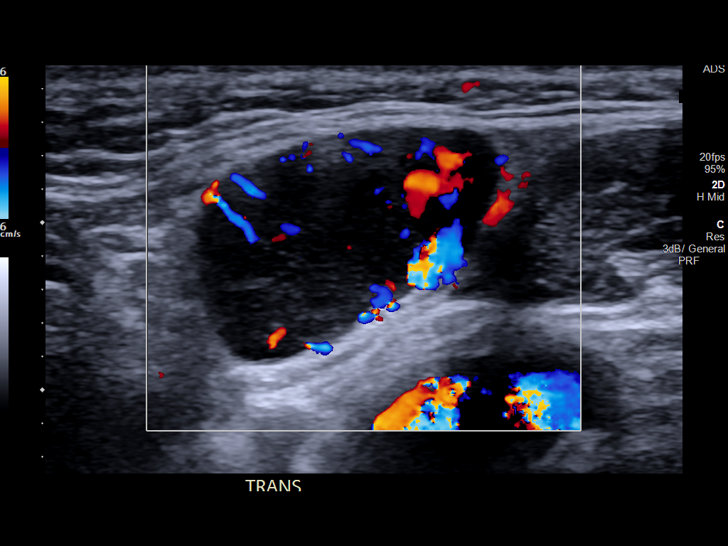
[im 5/18]
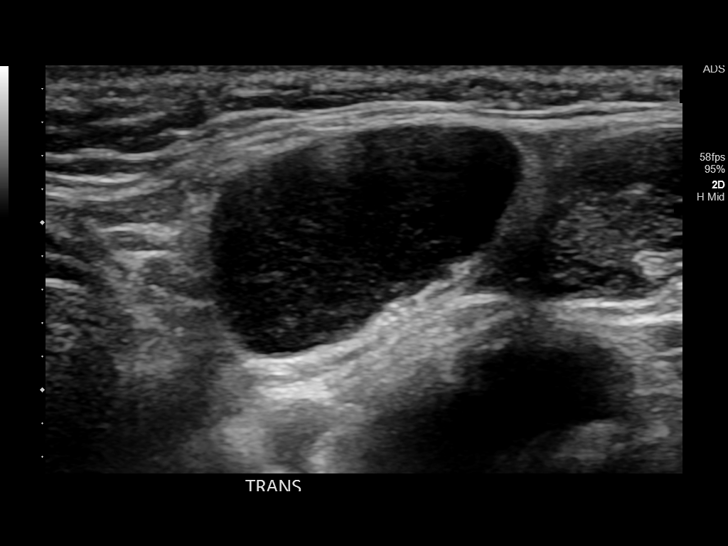
[im 7/18]
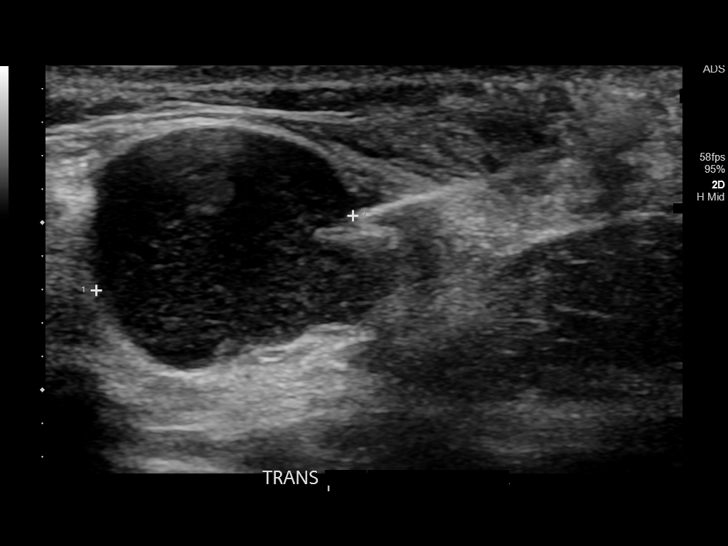
[im 8/18]
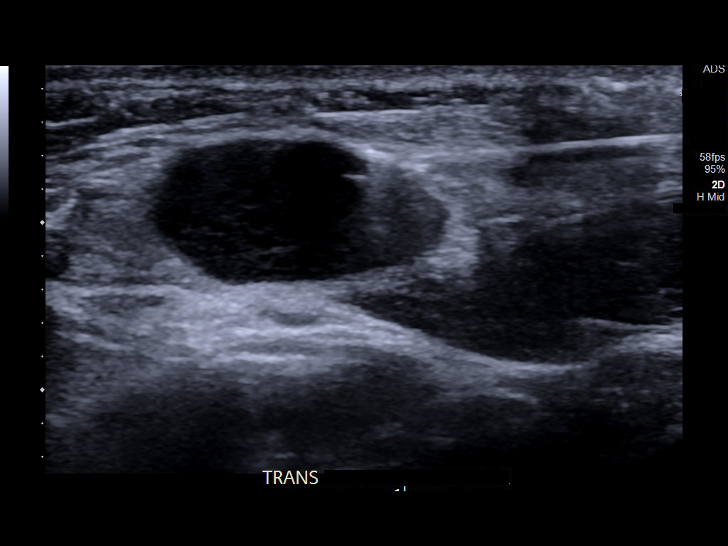
[im 10/18]
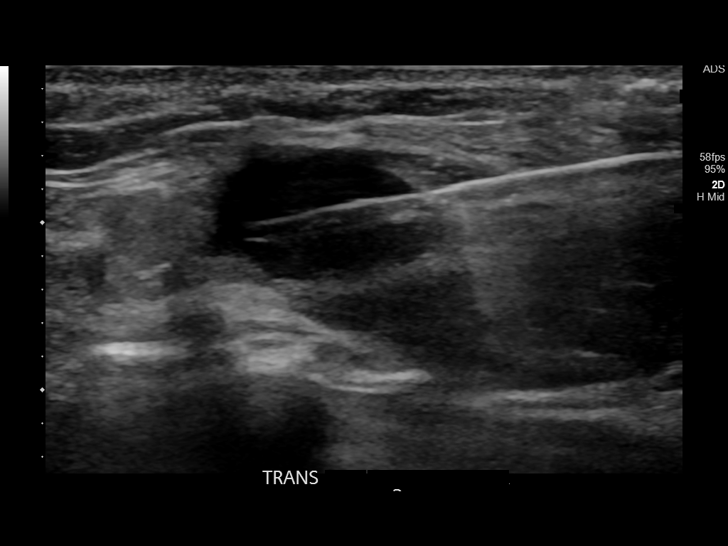
[im 11/18]
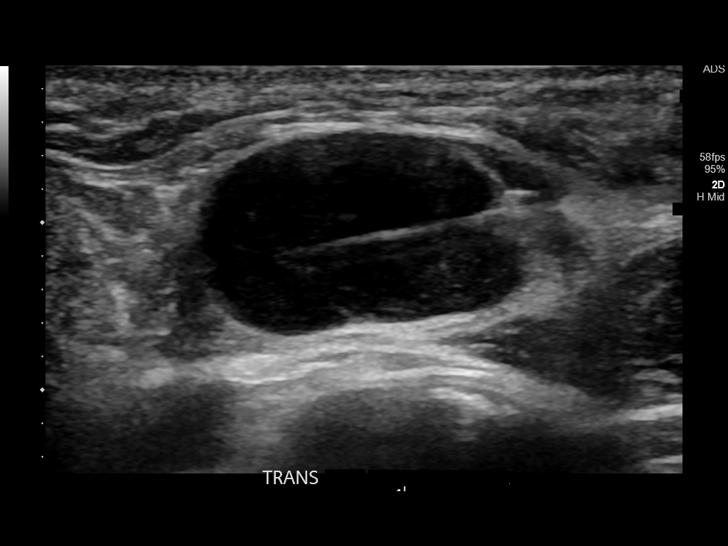
[im 12/18]
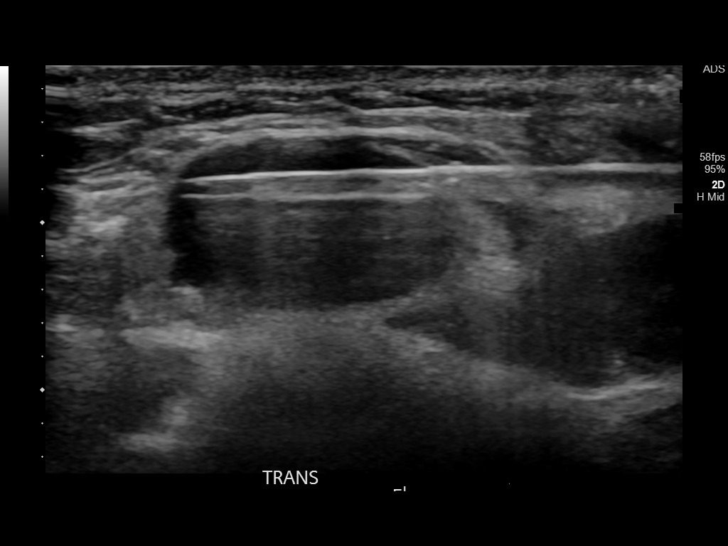
[im 14/18]
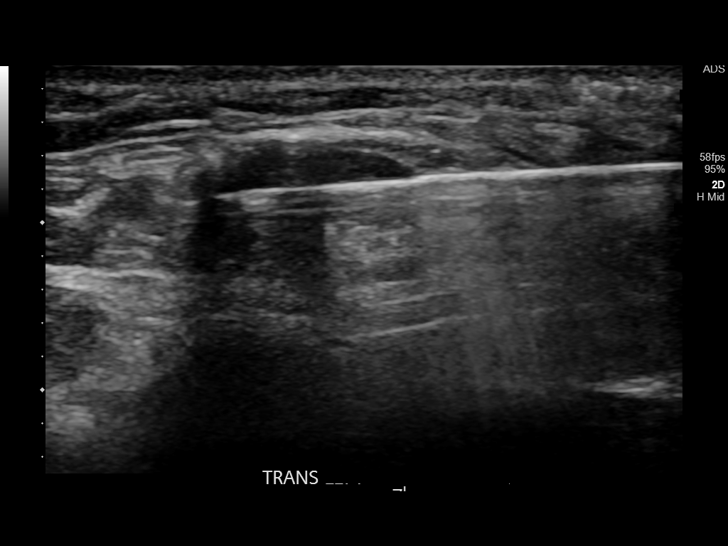
[im 15/18]
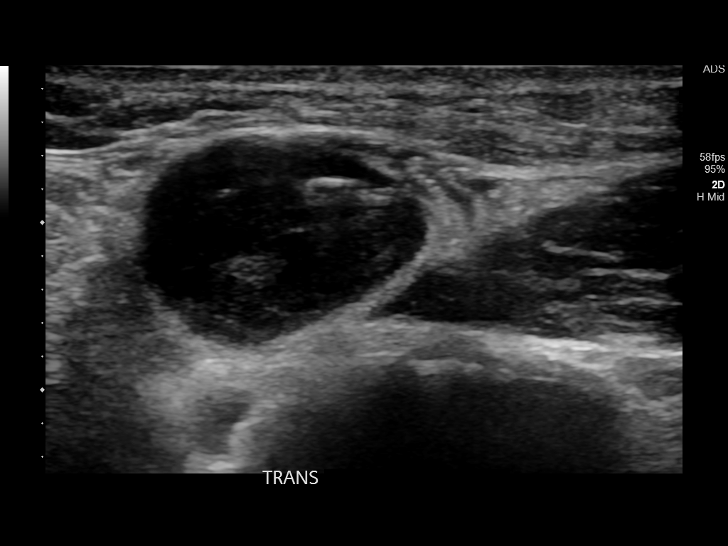
[im 16/18]
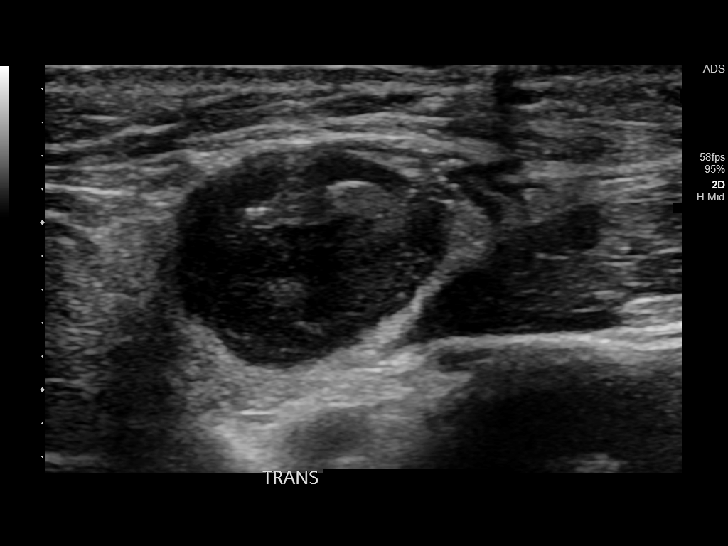
[im 18/18]
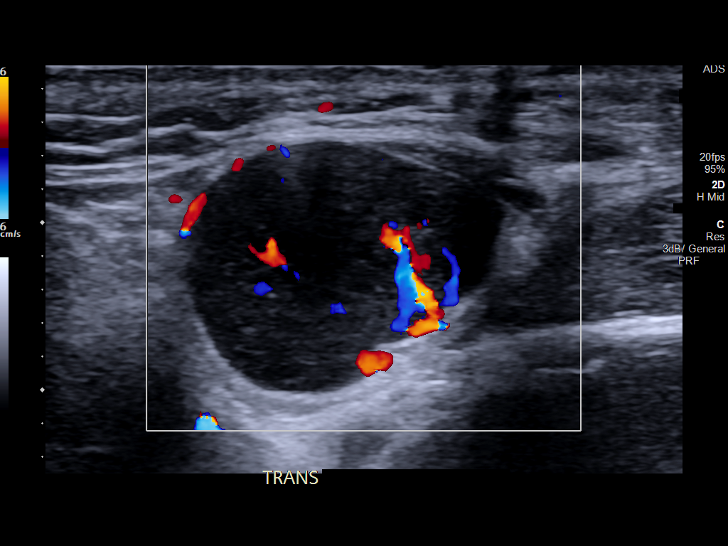

[13 of 18 positions shown; findings below may reference images not displayed]

EXAM:
ULTRASOUND GUIDED CORE BIOPSY OF LEFT SUBMANDIBULAR ADENOPATHY

MEDICATIONS:
lidocaine 1% subcutaneous

ANESTHESIA/SEDATION:
Intravenous Fentanyl and Versed were administered as conscious
sedation during continuous monitoring of the patient's level of
consciousness and physiological / cardiorespiratory status by the
radiology RN, with a total moderate sedation time of 11 minutes.

PROCEDURE:
The procedure, risks, benefits, and alternatives were explained to
the patient. Questions regarding the procedure were encouraged and
answered. The patient understands and consents to the procedure.

Survey ultrasound performed in the enlarged left submandibular node
was localized. An appropriate site was determined and marked.

The operative field was prepped with chlorhexidine in a sterile
fashion, and a sterile drape was applied covering the operative
field. A sterile gown and sterile gloves were used for the
procedure. Local anesthesia was provided with 1% Lidocaine.

Under real-time ultrasound guidance, a 18-gauge core biopsy samples
were obtained, submitted in saline to surgical pathology and
microbiology for the requested studies. The guide needle was
removed. Postprocedure scans show no hemorrhage or other apparent
complication.

COMPLICATIONS:
None immediate.
FINDINGS: Hypoechoic enlarged left submandibular node localized.
Representative core biopsy samples obtained as above.
IMPRESSION: 1. Technically successful ultrasound-guided core biopsy, left
submandibular adenopathy.

## 2020-02-12 DIAGNOSIS — M25651 Stiffness of right hip, not elsewhere classified: Secondary | ICD-10-CM | POA: Diagnosis not present

## 2020-02-12 DIAGNOSIS — M25551 Pain in right hip: Secondary | ICD-10-CM | POA: Diagnosis not present

## 2020-02-20 DIAGNOSIS — M25651 Stiffness of right hip, not elsewhere classified: Secondary | ICD-10-CM | POA: Diagnosis not present

## 2020-02-20 DIAGNOSIS — M25551 Pain in right hip: Secondary | ICD-10-CM | POA: Diagnosis not present

## 2020-02-26 ENCOUNTER — Ambulatory Visit (INDEPENDENT_AMBULATORY_CARE_PROVIDER_SITE_OTHER): Payer: PPO

## 2020-02-26 ENCOUNTER — Other Ambulatory Visit: Payer: Self-pay

## 2020-02-26 VITALS — BP 120/76 | HR 99 | Temp 98.4°F | Resp 16 | Ht 70.0 in | Wt 184.0 lb

## 2020-02-26 DIAGNOSIS — Z0000 Encounter for general adult medical examination without abnormal findings: Secondary | ICD-10-CM | POA: Diagnosis not present

## 2020-02-26 NOTE — Patient Instructions (Addendum)
Mr. Eric Rose , Thank you for taking time to come for your Medicare Wellness Visit. I appreciate your ongoing commitment to your health goals. Please review the following plan we discussed and let me know if I can assist you in the future.   Screening recommendations/referrals: Colonoscopy: done 2014. Repeat in 2024 Recommended yearly ophthalmology/optometry visit for glaucoma screening and checkup Recommended yearly dental visit for hygiene and checkup  Vaccinations: Influenza vaccine: due Pneumococcal vaccine: due Tdap vaccine: done 03/12/18 Shingles vaccine: Shingrix discussed. Please contact your pharmacy for coverage information.  Covid-19:  Done 05/15/19 & 06/04/19; please bring a copy of your booster vaccine to your next appointment.   Advanced directives: Please bring a copy of your health care power of attorney and living will to the office at your convenience.   Conditions/risks identified: Keep up the great work!  Next appointment: Follow up in one year for your annual wellness visit.   Influenza (Flu) Vaccine (Inactivated or Recombinant): What You Need to Know 1. Why get vaccinated? Influenza vaccine can prevent influenza (flu). Flu is a contagious disease that spreads around the [place] every year, usually between October and May. Anyone can get the flu, but it is more dangerous for some people. Infants and young children, people 40 years of age and older, pregnant women, and people with certain health conditions or a weakened immune system are at greatest risk of flu complications. Pneumonia, bronchitis, sinus infections and ear infections are examples of flu-related complications. If you have a medical condition, such as heart disease, cancer or diabetes, flu can make it worse. Flu can cause fever and chills, sore throat, muscle aches, fatigue, cough, headache, and runny or stuffy nose. Some people may have vomiting and diarrhea, though this is more common in children than  adults. Each year thousands of people in the [place] die from flu, and many more are hospitalized. Flu vaccine prevents millions of illnesses and flu-related visits to the doctor each year. 2. Influenza vaccine CDC recommends everyone 49 months of age and older get vaccinated every flu season. Children 6 months through 25 years of age may need 2 doses during a single flu season. Everyone else needs only 1 dose each flu season. It takes about 2 weeks for protection to develop after vaccination. There are many flu viruses, and they are always changing. Each year a new flu vaccine is made to protect against three or four viruses that are likely to cause disease in the upcoming flu season. Even when the vaccine doesn't exactly match these viruses, it may still provide some protection. Influenza vaccine does not cause flu. Influenza vaccine may be given at the same time as other vaccines. 3. Talk with your health care provider Tell your vaccine provider if the person getting the vaccine:  Has had an allergic reaction after a previous dose of influenza vaccine, or has any severe, life-threatening allergies.  Has ever had Guillain-Barr Syndrome (also called GBS). In some cases, your health care provider may decide to postpone influenza vaccination to a future visit. People with minor illnesses, such as a cold, may be vaccinated. People who are moderately or severely ill should usually wait until they recover before getting influenza vaccine. Your health care provider can give you more information. 4. Risks of a vaccine reaction  Soreness, redness, and swelling where shot is given, fever, muscle aches, and headache can happen after influenza vaccine.  There may be a very small increased risk of Guillain-Barr Syndrome (GBS) after  inactivated influenza vaccine (the flu shot). Young children who get the flu shot along with pneumococcal vaccine (PCV13), and/or DTaP vaccine at the same time might be  slightly more likely to have a seizure caused by fever. Tell your health care provider if a child who is getting flu vaccine has ever had a seizure. People sometimes faint after medical procedures, including vaccination. Tell your provider if you feel dizzy or have vision changes or ringing in the ears. As with any medicine, there is a very remote chance of a vaccine causing a severe allergic reaction, other serious injury, or death. 5. What if there is a serious problem? An allergic reaction could occur after the vaccinated person leaves the clinic. If you see signs of a severe allergic reaction (hives, swelling of the face and throat, difficulty breathing, a fast heartbeat, dizziness, or weakness), call 9-1-1 and get the person to the nearest hospital. For other signs that concern you, call your health care provider. Adverse reactions should be reported to the Vaccine Adverse Event Reporting System (VAERS). Your health care provider will usually file this report, or you can do it yourself. Visit the VAERS website at www.vaers.SamedayNews.es or call (731)211-9528.VAERS is only for reporting reactions, and VAERS staff do not give medical advice. 6. The National Vaccine Injury Compensation Program The [employer] Vaccine Injury Compensation Program (VICP) is a federal program that was created to compensate people who may have been injured by certain vaccines. Visit the VICP website at GoldCloset.com.ee or call 980-503-6902 to learn about the program and about filing a claim. There is a time limit to file a claim for compensation. 7. How can I learn more?  Ask your healthcare provider.  Call your local or state health department.  Contact the Centers for Disease Control and Prevention (CDC): ? Call 860-655-6588 (1-800-CDC-INFO) or ? Visit CDC's https://gibson.com/ Vaccine Information Statement (Interim) Inactivated Influenza Vaccine (10/25/2017) This information is not intended to replace  advice given to you by your health care provider. Make sure you discuss any questions you have with your health care provider. Document Revised: 06/18/2018 Document Reviewed: 10/29/2017 Elsevier Patient Education  [place].  Pneumococcal Conjugate Vaccine (PCV13): What You Need to Know 1. Why get vaccinated? Pneumococcal conjugate vaccine (PCV13) can prevent pneumococcal disease. Pneumococcal disease refers to any illness caused by pneumococcal bacteria. These bacteria can cause many types of illnesses, including pneumonia, which is an infection of the lungs. Pneumococcal bacteria are one of the most common causes of pneumonia. Besides pneumonia, pneumococcal bacteria can also cause:  Ear infections  Sinus infections  Meningitis (infection of the tissue covering the brain and spinal cord)  Bacteremia (bloodstream infection) Anyone can get pneumococcal disease, but children under 42 years of age, people with certain medical conditions, adults 72 years or older, and cigarette smokers are at the highest risk. Most pneumococcal infections are mild. However, some can result in long-term problems, such as brain damage or hearing loss. Meningitis, bacteremia, and pneumonia caused by pneumococcal disease can be fatal. 2. PCV13 PCV13 protects against 13 types of bacteria that cause pneumococcal disease. Infants and young children usually need 4 doses of pneumococcal conjugate vaccine, at 2, 4, 6, and 21-64 months of age. In some cases, a child might need fewer than 4 doses to complete PCV13 vaccination. A dose of PCV23 vaccine is also recommended for anyone 2 years or older with certain medical conditions if they did not already receive PCV13. This vaccine may be given to adults 65  years or older based on discussions between the patient and health care provider. 3. Talk with your health care provider Tell your vaccine provider if the person getting the vaccine:  Has had an allergic  reaction after a previous dose of PCV13, to an earlier pneumococcal conjugate vaccine known as PCV7, or to any vaccine containing diphtheria toxoid (for example, DTaP), or has any severe, life-threatening allergies.  In some cases, your health care provider may decide to postpone PCV13 vaccination to a future visit. People with minor illnesses, such as a cold, may be vaccinated. People who are moderately or severely ill should usually wait until they recover before getting PCV13. Your health care provider can give you more information. 4. Risks of a vaccine reaction  Redness, swelling, pain, or tenderness where the shot is given, and fever, loss of appetite, fussiness (irritability), feeling tired, headache, and chills can happen after PCV13. Young children may be at increased risk for seizures caused by fever after PCV13 if it is administered at the same time as inactivated influenza vaccine. Ask your health care provider for more information. People sometimes faint after medical procedures, including vaccination. Tell your provider if you feel dizzy or have vision changes or ringing in the ears. As with any medicine, there is a very remote chance of a vaccine causing a severe allergic reaction, other serious injury, or death. 5. What if there is a serious problem? An allergic reaction could occur after the vaccinated person leaves the clinic. If you see signs of a severe allergic reaction (hives, swelling of the face and throat, difficulty breathing, a fast heartbeat, dizziness, or weakness), call 9-1-1 and get the person to the nearest hospital. For other signs that concern you, call your health care provider. Adverse reactions should be reported to the Vaccine Adverse Event Reporting System (VAERS). Your health care provider will usually file this report, or you can do it yourself. Visit the VAERS website at www.vaers.SamedayNews.es or call (905)374-6261. VAERS is only for reporting reactions, and VAERS  staff do not give medical advice. 6. The National Vaccine Injury Compensation Program The [employer] Vaccine Injury Compensation Program (VICP) is a federal program that was created to compensate people who may have been injured by certain vaccines. Visit the VICP website at GoldCloset.com.ee or call 385 452 8971 to learn about the program and about filing a claim. There is a time limit to file a claim for compensation. 7. How can I learn more?  Ask your health care provider.  Call your local or state health department.  Contact the Centers for Disease Control and Prevention (CDC): ? Call 614-045-1216 (1-800-CDC-INFO) or ? Visit CDC's website at http://hunter.com/ Vaccine Information Statement PCV13 Vaccine (01/09/2018) This information is not intended to replace advice given to you by your health care provider. Make sure you discuss any questions you have with your health care provider. Document Revised: 06/18/2018 Document Reviewed: 10/09/2017 Elsevier Patient Education  [place] 65 Years and Older, Male Preventive care refers to lifestyle choices and visits with your health care provider that can promote health and wellness. What does preventive care include?  A yearly physical exam. This is also called an annual well check.  Dental exams once or twice a year.  Routine eye exams. Ask your health care provider how often you should have your eyes checked.  Personal lifestyle choices, including:  Daily care of your teeth and gums.  Regular physical activity.  Eating a healthy diet.  Avoiding  tobacco and drug use.  Limiting alcohol use.  Practicing safe sex.  Taking low doses of aspirin every day.  Taking vitamin and mineral supplements as recommended by your health care provider. What happens during an annual well check? The services and screenings done by your health care provider during your annual well check will  depend on your age, overall health, lifestyle risk factors, and family history of disease. Counseling  Your health care provider may ask you questions about your:  Alcohol use.  Tobacco use.  Drug use.  Emotional well-being.  Home and relationship well-being.  Sexual activity.  Eating habits.  History of falls.  Memory and ability to understand (cognition).  Work and work Statistician. Screening  You may have the following tests or measurements:  Height, weight, and BMI.  Blood pressure.  Lipid and cholesterol levels. These may be checked every 5 years, or more frequently if you are over 76 years old.  Skin check.  Lung cancer screening. You may have this screening every year starting at age 18 if you have a 30-pack-year history of smoking and currently smoke or have quit within the past 15 years.  Fecal occult blood test (FOBT) of the stool. You may have this test every year starting at age 61.  Flexible sigmoidoscopy or colonoscopy. You may have a sigmoidoscopy every 5 years or a colonoscopy every 10 years starting at age 37.  Prostate cancer screening. Recommendations will vary depending on your family history and other risks.  Hepatitis C blood test.  Hepatitis B blood test.  Sexually transmitted disease (STD) testing.  Diabetes screening. This is done by checking your blood sugar (glucose) after you have not eaten for a while (fasting). You may have this done every 1-3 years.  Abdominal aortic aneurysm (AAA) screening. You may need this if you are a current or former smoker.  Osteoporosis. You may be screened starting at age 21 if you are at high risk. Talk with your health care provider about your test results, treatment options, and if necessary, the need for more tests. Vaccines  Your health care provider may recommend certain vaccines, such as:  Influenza vaccine. This is recommended every year.  Tetanus, diphtheria, and acellular pertussis (Tdap,  Td) vaccine. You may need a Td booster every 10 years.  Zoster vaccine. You may need this after age 90.  Pneumococcal 13-valent conjugate (PCV13) vaccine. One dose is recommended after age 80.  Pneumococcal polysaccharide (PPSV23) vaccine. One dose is recommended after age 42. Talk to your health care provider about which screenings and vaccines you need and how often you need them. This information is not intended to replace advice given to you by your health care provider. Make sure you discuss any questions you have with your health care provider. Document Released: 03/26/2015 Document Revised: 11/17/2015 Document Reviewed: 12/29/2014 Elsevier Interactive Patient Education  2017 [place] Prevention in the Home Falls can cause injuries. They can happen to people of all ages. There are many things you can do to make your home safe and to help prevent falls. What can I do on the outside of my home?  Regularly fix the edges of walkways and driveways and fix any cracks.  Remove anything that might make you trip as you walk through a door, such as a raised step or threshold.  Trim any bushes or trees on the path to your home.  Use bright outdoor lighting.  Clear any walking paths of anything that might  make someone trip, such as rocks or tools.  Regularly check to see if handrails are loose or broken. Make sure that both sides of any steps have handrails.  Any raised decks and porches should have guardrails on the edges.  Have any leaves, snow, or ice cleared regularly.  Use sand or salt on walking paths during winter.  Clean up any spills in your garage right away. This includes oil or grease spills. What can I do in the bathroom?  Use night lights.  Install grab bars by the toilet and in the tub and shower. Do not use towel bars as grab bars.  Use non-skid mats or decals in the tub or shower.  If you need to sit down in the shower, use a plastic, non-slip  stool.  Keep the floor dry. Clean up any water that spills on the floor as soon as it happens.  Remove soap buildup in the tub or shower regularly.  Attach bath mats securely with double-sided non-slip rug tape.  Do not have throw rugs and other things on the floor that can make you trip. What can I do in the bedroom?  Use night lights.  Make sure that you have a light by your bed that is easy to reach.  Do not use any sheets or blankets that are too big for your bed. They should not hang down onto the floor.  Have a firm chair that has side arms. You can use this for support while you get dressed.  Do not have throw rugs and other things on the floor that can make you trip. What can I do in the kitchen?  Clean up any spills right away.  Avoid walking on wet floors.  Keep items that you use a lot in easy-to-reach places.  If you need to reach something above you, use a strong step stool that has a grab bar.  Keep electrical cords out of the way.  Do not use floor polish or wax that makes floors slippery. If you must use wax, use non-skid floor wax.  Do not have throw rugs and other things on the floor that can make you trip. What can I do with my stairs?  Do not leave any items on the stairs.  Make sure that there are handrails on both sides of the stairs and use them. Fix handrails that are broken or loose. Make sure that handrails are as long as the stairways.  Check any carpeting to make sure that it is firmly attached to the stairs. Fix any carpet that is loose or worn.  Avoid having throw rugs at the top or bottom of the stairs. If you do have throw rugs, attach them to the floor with carpet tape.  Make sure that you have a light switch at the top of the stairs and the bottom of the stairs. If you do not have them, ask someone to add them for you. What else can I do to help prevent falls?  Wear shoes that:  Do not have high heels.  Have rubber bottoms.  Are  comfortable and fit you well.  Are closed at the toe. Do not wear sandals.  If you use a stepladder:  Make sure that it is fully opened. Do not climb a closed stepladder.  Make sure that both sides of the stepladder are locked into place.  Ask someone to hold it for you, if possible.  Clearly mark and make sure that you can see:  Any grab bars or handrails.  First and last steps.  Where the edge of each step is.  Use tools that help you move around (mobility aids) if they are needed. These include:  Canes.  Walkers.  Scooters.  Crutches.  Turn on the lights when you go into a dark area. Replace any light bulbs as soon as they burn out.  Set up your furniture so you have a clear path. Avoid moving your furniture around.  If any of your floors are uneven, fix them.  If there are any pets around you, be aware of where they are.  Review your medicines with your doctor. Some medicines can make you feel dizzy. This can increase your chance of falling. Ask your doctor what other things that you can do to help prevent falls. This information is not intended to replace advice given to you by your health care provider. Make sure you discuss any questions you have with your health care provider. Document Released: 12/24/2008 Document Revised: 08/05/2015 Document Reviewed: 04/03/2014 Elsevier Interactive Patient Education  2017 [employer].

## 2020-02-26 NOTE — Progress Notes (Signed)
Subjective:   Eric Rose is a 65 y.o. male who presents for an Initial Medicare Annual Wellness Visit.  Review of Systems     Cardiac Risk Factors include: advanced age (>41men, >76 women);male gender;dyslipidemia;hypertension     Objective:    Today's Vitals   02/26/20 0943  BP: 120/76  Pulse: 99  Resp: 16  Temp: 98.4 F (36.9 C)  TempSrc: Oral  SpO2: 97%  Weight: 184 lb (83.5 kg)  Height: 5\' 10"  (1.778 m)   Body mass index is 26.4 kg/m.  Advanced Directives 02/26/2020 11/26/2019 11/26/2019 05/06/2018 02/07/2016 08/06/2015 04/14/2015  Does Patient Have a Medical Advance Directive? Yes Yes Yes No Yes Yes No  Type of Paramedic of [place];Living will Healthcare Power of [place] will Living will -  Does patient want to make changes to medical advance directive? - No - Patient declined - - - - -  Copy of [place] in Chart? No - copy requested No - copy requested No - copy requested - - No - copy requested -  Would patient like information on creating a medical advance directive? - - - Yes (MAU/Ambulatory/Procedural Areas - Information given) - - No - patient declined information    Current Medications (verified) Outpatient Encounter Medications as of 02/26/2020  Medication Sig  . acetaminophen (TYLENOL) 500 MG tablet Take 500-1,000 mg by mouth daily.  . Cyanocobalamin (VITAMIN B-12 PO) Take 1 tablet by mouth daily.  Marland Kitchen docusate sodium (COLACE) 100 MG capsule Take 1 capsule (100 mg total) by mouth 2 (two) times daily.  Marland Kitchen donepezil (ARICEPT) 5 MG tablet Take 5 mg by mouth daily with supper.   Marland Kitchen lisinopril (ZESTRIL) 20 MG tablet Take 1 tablet (20 mg total) by mouth daily.  . memantine (NAMENDA) 5 MG tablet Take 5 mg by mouth 2 (two) times daily.  . Multiple Vitamin (MULTIVITAMIN) capsule Take 1 capsule by mouth daily. 50 +  . Omega-3 Fatty Acids (FISH OIL) 1000 MG CAPS Take by mouth.  .  pravastatin (PRAVACHOL) 20 MG tablet Take 1 tablet (20 mg total) by mouth at bedtime.  . tadalafil (CIALIS) 10 MG tablet TAKE 1 TABLET BY MOUTH EVERY DAY FOR ERECTILE DYSFUNCTION.  Marland Kitchen VITAMIN D, ERGOCALCIFEROL, PO Take 1,000 mg by mouth daily.   . Vitamin E 400 units TABS Take 400 mg by mouth daily.   . [DISCONTINUED] amoxicillin (AMOXIL) 500 MG capsule Take 500 mg by mouth once.  (Patient not taking: Reported on 01/21/2020)  . [DISCONTINUED] aspirin EC 81 MG tablet Take 1 tablet (81 mg total) by mouth daily.  . [DISCONTINUED] aspirin EC 81 MG tablet Take 1 tablet (81 mg total) by mouth 2 (two) times daily. Swallow whole.  . [DISCONTINUED] HYDROcodone-acetaminophen (NORCO/VICODIN) 5-325 MG tablet Take 1 tablet by mouth every 4 (four) hours as needed for moderate pain (pain).   No facility-administered encounter medications on file as of 02/26/2020.    Allergies (verified) Patient has no known allergies.   History: Past Medical History:  Diagnosis Date  . Allergy   . Anxiety   . BPH (benign prostatic hypertrophy) with urinary obstruction    followed by urologist  . Decreased libido   . Dementia ([place])   . Erectile dysfunction 08/08/2015  . GERD (gastroesophageal reflux disease)   . History of pneumonia   . Hypertension   . Lumbago   . 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 JOINT REPLACEMENT Bilateral    hip  . ORIF FEMUR FRACTURE Right 11/26/2019   Procedure: OPEN REDUCTION INTERNAL FIXATION (ORIF) proximal FEMUR FRACTURE;  Surgeon: Lovell Sheehan, MD;  Location: [facility] ORS;  Service: Orthopedics;  Laterality: Right;  . 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 Diabetes Mother   . Heart disease  Mother   . Alzheimer's disease Mother   . Prostate cancer Father   . Prostate cancer Brother   . Prostate cancer Paternal Uncle   . Bladder Cancer Neg Hx   . Kidney cancer Neg Hx    Social History   Socioeconomic History  . Marital status: Married    Spouse name: [name]  . Number of children: 3  . Years of education: 75  . Highest education level: Some college, no degree  Occupational History  . Occupation: retired  Tobacco Use  . Smoking status: Never Smoker  . Smokeless tobacco: Never Used  Vaping Use  . Vaping Use: Never used  Substance and Sexual Activity  . Alcohol use: No    Alcohol/week: 0.0 standard drinks  . Drug use: No  . Sexual activity: Yes  Other Topics Concern  . Not on file  Social History Narrative   Retired from [employer] 12/2018   Social Determinants of Health   Financial Resource Strain: Low Risk   . Difficulty of Paying Living Expenses: Not hard at all  Food Insecurity: No Food Insecurity  . Worried About Charity fundraiser in the Last Year: Never true  . Ran Out of Food in the Last Year: Never true  Transportation Needs: No Transportation Needs  . Lack of Transportation (Medical): No  . Lack of Transportation (Non-Medical): No  Physical Activity: Sufficiently Active  . Days of Exercise per Week: 7 days  . Minutes of Exercise per Session: 30 min  Stress: No Stress Concern Present  . Feeling of Stress : Not at all  Social Connections: Moderately Integrated  . Frequency of Communication with Friends and Family: More than three times a week  . Frequency of Social Gatherings with Friends and Family: Twice a week  . Attends Religious Services: More than 4 times per year  . Active Member of Clubs or Organizations: No  . Attends Archivist Meetings: Never  . Marital Status: Married    Tobacco Counseling Counseling given: Not Answered   Clinical Intake:  Pre-visit preparation completed: Yes  Pain : No/denies pain      Nutritional Risks: None Diabetes: No  How often do you need to have someone help you when you read instructions, pamphlets, or other written materials from your doctor or pharmacy?: 1 - Never    Interpreter Needed?: No  Information entered by :: Clemetine Marker LPN   Activities of Daily Living In your present state of health, do you have any difficulty performing the following activities: 02/26/2020 11/26/2019  Hearing? N N  Comment declines hearing aids -  Vision? Y N  Difficulty concentrating or making decisions? Y N  Walking or climbing stairs? N Y  Dressing or bathing? N N  Doing errands, shopping? N N  Preparing Food and eating ? N -  Using the Toilet? N -  In the past six months, have you accidently leaked urine? N -  Do you have problems with loss of bowel control? N -  Managing your Medications? N -  Managing your Finances? N -  Housekeeping or managing your Housekeeping? N -  Some recent data might be hidden    Patient Care Team: Towanda Malkin, MD as PCP - General (Internal Medicine) Sunday Corn Mervin Kung (Neurology) Nori Riis PA-C as Physician Assistant (Urology)  Indicate any recent Medical Services you may have received from other than [facility] providers in the past year (date may be approximate).     Assessment:   This is a routine wellness examination for [place].  Hearing/Vision screen  Hearing Screening   125Hz  250Hz  500Hz  1000Hz  2000Hz  3000Hz  4000Hz  6000Hz  8000Hz   Right ear:           Left ear:           Comments: Pt denies hearing difficulty  Vision Screening Comments: [facility]  Dietary issues and exercise activities discussed: Current Exercise Habits: Home exercise routine, Type of exercise: walking;treadmill;Other - see comments;strength training/weights (exercise bike), Time (Minutes): 30, Frequency (Times/Week): 7, Weekly Exercise (Minutes/Week): 210, Intensity: Moderate, Exercise  limited by: orthopedic condition(s)  Goals    . Patient Stated     Patient would like to remain active and healthy      Depression Screen PHQ 2/9 Scores 02/26/2020 10/10/2019 08/13/2019 04/07/2019 10/02/2018 04/04/2018 03/12/2018  PHQ - 2 Score 0 0 0 0 0 0 0  PHQ- 9 Score - 1 0 0 0 0 0    Fall Risk Fall Risk  02/26/2020 10/10/2019 08/13/2019 06/04/2019 04/07/2019  Falls in the past year? 1 - 0 0 0  Number falls in past yr: 0 0 0 0 0  Injury with Fall? 1 0 0 0 0  Risk for fall due to : History of fall(s);Impaired balance/gait - - - -  Follow up Falls prevention discussed - - - Falls evaluation completed    FALL RISK PREVENTION PERTAINING TO THE HOME:  Any stairs in or around the home? Yes  If so, are there any without handrails? No  Home free of loose throw rugs in walkways, pet beds, electrical cords, etc? Yes  Adequate lighting in your home to reduce risk of falls? Yes   ASSISTIVE DEVICES UTILIZED TO PREVENT FALLS:  Life alert? No  Use of a cane, walker or w/c? Yes Grab bars in the bathroom? Yes  Shower chair or bench in shower? No  Elevated toilet seat or a handicapped toilet? Yes   TIMED UP AND GO:  Was the test performed? Yes .  Length of time to ambulate 10 feet: 6 sec.   Gait steady and fast without use of assistive device  Cognitive Function: Cognitive status assessed by direct observation. Patient has current diagnosis of cognitive impairment. Patient is followed by neurology for ongoing assessment.        Immunizations Immunization History  Administered Date(s) Administered  . Influenza Inj Mdck Quad Pf 12/06/2017  . Influenza,inj,Quad PF,6+ Mos 02/18/2016, 12/29/2016, 11/19/2018  . PFIZER SARS-COV-2 Vaccination 05/15/2019, 06/04/2019  . Tdap 03/12/2018    TDAP status: Up to date   Flu Vaccine status: Declined, Education has been provided regarding the importance of this vaccine but patient still declined. Advised may receive this vaccine at local pharmacy or  Health Dept. Aware to provide a copy of the vaccination record if obtained from local  pharmacy or Health Dept. Verbalized acceptance and understanding.  Pneumococcal vaccine status: Declined,  Education has been provided regarding the importance of this vaccine but patient still declined. Advised may receive this vaccine at local pharmacy or Health Dept. Aware to provide a copy of the vaccination record if obtained from local pharmacy or Health Dept. Verbalized acceptance and understanding.   Covid-19 vaccine status: Completed vaccines  Qualifies for Shingles Vaccine? Yes   Zostavax completed No   Shingrix Completed?: No.    Education has been provided regarding the importance of this vaccine. Patient has been advised to call insurance company to determine out of pocket expense if they have not yet received this vaccine. Advised may also receive vaccine at local pharmacy or Health Dept. Verbalized acceptance and understanding.  Screening Tests Health Maintenance  Topic Date Due  . PNA vac Low Risk Adult (1 of 2 - PCV13) Never done  . INFLUENZA VACCINE  10/12/2019  . COVID-19 Vaccine (3 - Booster for Pfizer series) 12/05/2019  . COLONOSCOPY  03/13/2022  . TETANUS/TDAP  03/12/2028  . Hepatitis C Screening  Completed    Health Maintenance  Health Maintenance Due  Topic Date Due  . PNA vac Low Risk Adult (1 of 2 - PCV13) Never done  . INFLUENZA VACCINE  10/12/2019  . COVID-19 Vaccine (3 - Booster for Pfizer series) 12/05/2019     Colorectal cancer screening: Type of screening: Colonoscopy. Completed 2014. Repeat every 10 years  Lung Cancer Screening: (Low Dose CT Chest recommended if Age 62-80 years, 30 pack-year currently smoking OR have quit w/in 15years.) does not qualify.   Additional Screening:  Hepatitis C Screening: does qualify; Completed 02/07/16  Vision Screening: Recommended annual ophthalmology exams for early detection of glaucoma and other disorders of the eye. Is the  patient up to date with their annual eye exam?  Yes  Who is the provider or what is the name of the office in which the patient attends annual eye exams? [place] Screening: Recommended annual dental exams for proper oral hygiene  Community Resource Referral / Chronic Care Management: CRR required this visit?  No   CCM required this visit?  No      Plan:     I have personally reviewed and noted the following in the patient's chart:   . Medical and social history . Use of alcohol, tobacco or illicit drugs  . Current medications and supplements . Functional ability and status . Nutritional status . Physical activity . Advanced directives . List of other physicians . Hospitalizations, surgeries, and ER visits in previous 12 months . Vitals . Screenings to include cognitive, depression, and falls . Referrals and appointments  In addition, I have reviewed and discussed with patient certain preventive protocols, quality metrics, and best practice recommendations. A written personalized care plan for preventive services as well as general preventive health recommendations were provided to patient.     Clemetine Marker, LPN   15/17/6160   Nurse Notes: pt advised due for CPE and labs

## 2020-02-27 DIAGNOSIS — M25551 Pain in right hip: Secondary | ICD-10-CM | POA: Diagnosis not present

## 2020-02-27 DIAGNOSIS — M25651 Stiffness of right hip, not elsewhere classified: Secondary | ICD-10-CM | POA: Diagnosis not present

## 2020-03-03 DIAGNOSIS — M25651 Stiffness of right hip, not elsewhere classified: Secondary | ICD-10-CM | POA: Diagnosis not present

## 2020-03-03 DIAGNOSIS — M25551 Pain in right hip: Secondary | ICD-10-CM | POA: Diagnosis not present

## 2020-03-10 DIAGNOSIS — M25551 Pain in right hip: Secondary | ICD-10-CM | POA: Diagnosis not present

## 2020-03-10 DIAGNOSIS — M25651 Stiffness of right hip, not elsewhere classified: Secondary | ICD-10-CM | POA: Diagnosis not present

## 2020-04-11 ENCOUNTER — Other Ambulatory Visit: Payer: Self-pay | Admitting: Internal Medicine

## 2020-04-11 DIAGNOSIS — I10 Essential (primary) hypertension: Secondary | ICD-10-CM

## 2020-04-16 MED ORDER — LISINOPRIL 20 MG PO TABS: 20.0000 mg | ORAL_TABLET | Freq: Every day | ORAL | 0 refills | Status: DC

## 2020-04-16 NOTE — Telephone Encounter (Signed)
Pt calling and is requesting to have this refilled. Pt was seen at this office by Dr. Roxan Hockey. Please advise.

## 2020-04-16 NOTE — Addendum Note (Signed)
Addended by: Chilton Greathouse on: 04/16/2020 12:34 PM   Modules accepted: Orders

## 2020-04-16 NOTE — Telephone Encounter (Signed)
Patient called in to inquire about the refill on his Lisinopril asking if this Rx can be sent to his pharmacy this afternoon since he has been without for a few days. Please advise

## 2020-06-21 ENCOUNTER — Other Ambulatory Visit: Payer: Self-pay | Admitting: Family Medicine

## 2020-06-21 DIAGNOSIS — I10 Essential (primary) hypertension: Secondary | ICD-10-CM

## 2020-06-29 ENCOUNTER — Other Ambulatory Visit: Payer: Self-pay | Admitting: *Deleted

## 2020-06-29 DIAGNOSIS — R972 Elevated prostate specific antigen [PSA]: Secondary | ICD-10-CM

## 2020-07-16 ENCOUNTER — Other Ambulatory Visit: Payer: PPO

## 2020-07-16 ENCOUNTER — Other Ambulatory Visit: Payer: Self-pay

## 2020-07-16 DIAGNOSIS — R972 Elevated prostate specific antigen [PSA]: Secondary | ICD-10-CM

## 2020-07-17 LAB — PSA: Prostate Specific Ag, Serum: 2.1 ng/mL (ref 0.0–4.0)

## 2020-07-19 NOTE — Progress Notes (Deleted)
07/19/2020  9:34 PM   Eric Rose 1955/04/27 [id number]  Referring provider: Towanda Malkin, MD [employer],  [place] [address]  No chief complaint on file.  Urological history 1. PSA trend Component     Latest Ref Rng & Units 12/29/2014 01/12/2015 04/08/2015  Prostate Specific Ag, Serum     0.0 - 4.0 ng/mL 4.8 (H) 3.6 2.5   Component     Latest Ref Rng & Units 07/29/2015 08/23/2015 10/03/2016 03/29/2017  Prostate Specific Ag, Serum     0.0 - 4.0 ng/mL 3.5 4.9 (H) 2.6 1.5   Component     Latest Ref Rng & Units 11/15/2017 05/16/2018 11/22/2018 03/19/2019  Prostate Specific Ag, Serum     0.0 - 4.0 ng/mL 0.8 1.5 2.4 2.4   Component     Latest Ref Rng & Units 05/23/2019 01/21/2020 07/16/2020  Prostate Specific Ag, Serum     0.0 - 4.0 ng/mL 2.7 2.5 2.1   Discontinued the Jalyn in 2019   2  Family history of prostate cancer -father, brother and paternal uncle have been diagnosed with prostate cancer  3. BPH with LU TS -I PSS *** -PVR ***  4. Sleep apnea -sleeps with CPAP machine  HPI: Eric Rose is a 65 y.o. male who presents today for 6 month follow up.         Score:  1-7 Mild 8-19 Moderate 20-35 Severe  PMH: Past Medical History:  Diagnosis Date  . Allergy   . Anxiety   . BPH (benign prostatic hypertrophy) with urinary obstruction    followed by urologist  . Decreased libido   . Dementia ([place])   . Erectile dysfunction 08/08/2015  . GERD (gastroesophageal reflux disease)   . History of pneumonia   . Hypertension   . Lumbago   . 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 JOINT REPLACEMENT Bilateral    hip  . ORIF FEMUR FRACTURE Right 11/26/2019   Procedure: OPEN REDUCTION INTERNAL FIXATION (ORIF) proximal FEMUR FRACTURE;  Surgeon: Lovell Sheehan, MD;  Location: [facility] ORS;  Service: Orthopedics;   Laterality: Right;  . 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 If you have any questions, ask your nurse or doctor.        acetaminophen 500 MG tablet Commonly known as: TYLENOL Take 500-1,000 mg by mouth daily.   docusate sodium 100 MG capsule Commonly known as: COLACE Take 1 capsule (100 mg total) by mouth 2 (two) times daily.   donepezil 5 MG tablet Commonly known as: ARICEPT Take 5 mg by mouth daily with supper.   Fish Oil 1000 MG Caps Take by mouth.   lisinopril 20 MG tablet Commonly known as: ZESTRIL Take 1 tablet (20 mg total) by mouth daily.   memantine 5 MG tablet Commonly known as: NAMENDA Take 5 mg by mouth 2 (two) times daily.   multivitamin capsule Take 1 capsule by mouth daily. 50 +   pravastatin 20 MG tablet Commonly known as: PRAVACHOL Take 1 tablet (20 mg  total) by mouth at bedtime.   tadalafil 10 MG tablet Commonly known as: CIALIS TAKE 1 TABLET BY MOUTH EVERY DAY FOR ERECTILE DYSFUNCTION.   VITAMIN B-12 PO Take 1 tablet by mouth daily.   VITAMIN D (ERGOCALCIFEROL) PO Take 1,000 mg by mouth daily.   Vitamin E 400 units Tabs Take 400 mg by mouth daily.       Allergies: No Known Allergies  Family History: Family History  Problem Relation Age of Onset  . Diabetes Mother   . Heart disease Mother   . Alzheimer's disease Mother   . Prostate cancer Father   . Prostate cancer Brother   . Prostate cancer Paternal Uncle   . Bladder Cancer Neg Hx   . Kidney cancer Neg Hx     Social History:  reports that he has never smoked. He has never used smokeless tobacco. He reports that he does not drink alcohol and does not use drugs.  ROS: For pertinent review of systems please refer  to history of present illness  Physical Exam: There were no vitals taken for this visit.  Constitutional:  Well nourished. Alert and oriented, No acute distress. HEENT: [place] AT, moist mucus membranes.  Trachea midline Cardiovascular: No clubbing, cyanosis, or edema. Respiratory: Normal respiratory effort, no increased work of breathing. GI: Abdomen is soft, non tender, non distended, no abdominal masses. Liver and spleen not palpable.  No hernias appreciated.  Stool sample for occult testing is not indicated.   GU: No CVA tenderness.  No bladder fullness or masses.  Patient with circumcised/uncircumcised phallus. ***Foreskin easily retracted***  Urethral meatus is patent.  No penile discharge. No penile lesions or rashes. Scrotum without lesions, cysts, rashes and/or edema.  Testicles are located scrotally bilaterally. No masses are appreciated in the testicles. Left and right epididymis are normal. Rectal: Patient with  normal sphincter tone. Anus and perineum without scarring or rashes. No rectal masses are appreciated. Prostate is approximately *** grams, *** nodules are appreciated. Seminal vesicles are normal. Skin: No rashes, bruises or suspicious lesions. Lymph: No inguinal adenopathy. Neurologic: Grossly intact, no focal deficits, moving all 4 extremities. Psychiatric: Normal mood and affect.   Laboratory Data: See urological history  I have reviewed the labs  Pertinent imaging ***   Assessment & Plan:    1. BPH with LUTS IPSS score is 11/3, it is worse Continue conservative management, avoiding bladder irritants and timed voiding's He is avoiding caffeine at lunch and dinner and this is assisting and decreasing nocturia PSA pending from today RTC in 6 months for IPSS, PSA and exam - if PSA is stable  2. History of elevated PSA PSA stable RTC in 6 months for PSA   No follow-ups on file.  Zara Council, PA-C  [facility] [address]  [place], [place] [address] [phone]

## 2020-07-20 ENCOUNTER — Ambulatory Visit: Payer: PPO | Admitting: Urology

## 2020-07-20 DIAGNOSIS — N401 Enlarged prostate with lower urinary tract symptoms: Secondary | ICD-10-CM

## 2020-07-22 ENCOUNTER — Other Ambulatory Visit: Payer: Self-pay | Admitting: Family Medicine

## 2020-07-22 DIAGNOSIS — I10 Essential (primary) hypertension: Secondary | ICD-10-CM

## 2020-07-23 ENCOUNTER — Encounter: Payer: Self-pay | Admitting: Urology

## 2020-08-02 NOTE — Progress Notes (Deleted)
08/02/2020  9:37 AM   Eric Rose May 25, 1955 [id number]  Referring provider: Towanda Malkin, MD [place],  [place] [address]  No chief complaint on file.  Urological history 1. PSA trend Component     Latest Ref Rng & Units 12/29/2014 01/12/2015 04/08/2015  Prostate Specific Ag, Serum     0.0 - 4.0 ng/mL 4.8 (H) 3.6 2.5   Component     Latest Ref Rng & Units 07/29/2015 08/23/2015 10/03/2016 03/29/2017  Prostate Specific Ag, Serum     0.0 - 4.0 ng/mL 3.5 4.9 (H) 2.6 1.5   Component     Latest Ref Rng & Units 11/15/2017 05/16/2018 11/22/2018 03/19/2019  Prostate Specific Ag, Serum     0.0 - 4.0 ng/mL 0.8 1.5 2.4 2.4   Component     Latest Ref Rng & Units 05/23/2019 01/21/2020 07/16/2020  Prostate Specific Ag, Serum     0.0 - 4.0 ng/mL 2.7 2.5 2.1   Discontinued the Jalyn in 2019   2  Family history of prostate cancer -father, brother and paternal uncle have been diagnosed with prostate cancer  3. BPH with LU TS -I PSS *** -PVR ***  4. Sleep apnea -sleeps with CPAP machine  HPI: Eric Rose is a 65 y.o. male who presents today for 6 month follow up.         Score:  1-7 Mild 8-19 Moderate 20-35 Severe  PMH: Past Medical History:  Diagnosis Date  . Allergy   . Anxiety   . BPH (benign prostatic hypertrophy) with urinary obstruction    followed by urologist  . Decreased libido   . Dementia ([place])   . Erectile dysfunction 08/08/2015  . GERD (gastroesophageal reflux disease)   . History of pneumonia   . Hypertension   . Lumbago   . 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 JOINT REPLACEMENT Bilateral    hip  . ORIF FEMUR FRACTURE Right 11/26/2019   Procedure: OPEN REDUCTION INTERNAL FIXATION (ORIF) proximal FEMUR FRACTURE;  Surgeon: Lovell Sheehan, MD;  Location: [facility] ORS;  Service: Orthopedics;   Laterality: Right;  . 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   Procedure: TOTAL HIP ARTHROPLASTY;  Surgeon: Earnestine Leys, MD;  Location: [facility] ORS;  Service: Orthopedics;  Laterality: Left;    Home Medications:  Allergies as of 08/03/2020   No Known Allergies     Medication List       Accurate as of Aug 02, 2020  9:37 AM. If you have any questions, ask your nurse or doctor.        acetaminophen 500 MG tablet Commonly known as: TYLENOL Take 500-1,000 mg by mouth daily.   docusate sodium 100 MG capsule Commonly known as: COLACE Take 1 capsule (100 mg total) by mouth 2 (two) times daily.   donepezil 5 MG tablet Commonly known as: ARICEPT Take 5 mg by mouth daily with supper.   Fish Oil 1000 MG Caps Take by mouth.   lisinopril 20 MG tablet Commonly known as: ZESTRIL Take 1 tablet by mouth once daily   memantine 5 MG tablet Commonly known as: NAMENDA Take 5 mg by mouth 2 (two) times daily.   multivitamin capsule Take 1 capsule by mouth daily. 50 +   pravastatin 20 MG tablet Commonly known as: PRAVACHOL Take 1 tablet (20 mg total) by  mouth at bedtime.   tadalafil 10 MG tablet Commonly known as: CIALIS TAKE 1 TABLET BY MOUTH EVERY DAY FOR ERECTILE DYSFUNCTION.   VITAMIN B-12 PO Take 1 tablet by mouth daily.   VITAMIN D (ERGOCALCIFEROL) PO Take 1,000 mg by mouth daily.   Vitamin E 400 units Tabs Take 400 mg by mouth daily.       Allergies: No Known Allergies  Family History: Family History  Problem Relation Age of Onset  . Diabetes Mother   . Heart disease Mother   . Alzheimer's disease Mother   . Prostate cancer Father   . Prostate cancer Brother   . Prostate cancer Paternal Uncle   . Bladder Cancer Neg Hx   . Kidney cancer Neg Hx     Social History:  reports that he has never smoked. He has never used smokeless tobacco. He reports that he does not drink alcohol and does not use drugs.  ROS: For pertinent review of systems please refer to history  of present illness  Physical Exam: There were no vitals taken for this visit.  Constitutional:  Well nourished. Alert and oriented, No acute distress. HEENT: [place] AT, moist mucus membranes.  Trachea midline Cardiovascular: No clubbing, cyanosis, or edema. Respiratory: Normal respiratory effort, no increased work of breathing. GI: Abdomen is soft, non tender, non distended, no abdominal masses. Liver and spleen not palpable.  No hernias appreciated.  Stool sample for occult testing is not indicated.   GU: No CVA tenderness.  No bladder fullness or masses.  Patient with circumcised/uncircumcised phallus. ***Foreskin easily retracted***  Urethral meatus is patent.  No penile discharge. No penile lesions or rashes. Scrotum without lesions, cysts, rashes and/or edema.  Testicles are located scrotally bilaterally. No masses are appreciated in the testicles. Left and right epididymis are normal. Rectal: Patient with  normal sphincter tone. Anus and perineum without scarring or rashes. No rectal masses are appreciated. Prostate is approximately *** grams, *** nodules are appreciated. Seminal vesicles are normal. Skin: No rashes, bruises or suspicious lesions. Lymph: No inguinal adenopathy. Neurologic: Grossly intact, no focal deficits, moving all 4 extremities. Psychiatric: Normal mood and affect.   Laboratory Data: See urological history  I have reviewed the labs  Pertinent imaging ***   Assessment & Plan:    1. BPH with LUTS IPSS score is 11/3, it is worse Continue conservative management, avoiding bladder irritants and timed voiding's He is avoiding caffeine at lunch and dinner and this is assisting and decreasing nocturia PSA pending from today RTC in 6 months for IPSS, PSA and exam - if PSA is stable    No follow-ups on file.  Zara Council, PA-C  [facility] [address] [place], [place] [address] [phone]

## 2020-08-03 ENCOUNTER — Ambulatory Visit: Payer: PPO | Admitting: Urology

## 2020-08-03 DIAGNOSIS — N401 Enlarged prostate with lower urinary tract symptoms: Secondary | ICD-10-CM

## 2020-08-10 NOTE — Progress Notes (Deleted)
08/10/2020  9:06 AM   Eric Rose 12/13/1955 [id number]  Referring provider: Towanda Malkin, MD [place],  [place] [address]  No chief complaint on file.  Urological history 1. PSA trend Component     Latest Ref Rng & Units 12/29/2014 01/12/2015 04/08/2015  Prostate Specific Ag, Serum     0.0 - 4.0 ng/mL 4.8 (H) 3.6 2.5   Component     Latest Ref Rng & Units 07/29/2015 08/23/2015 10/03/2016 03/29/2017  Prostate Specific Ag, Serum     0.0 - 4.0 ng/mL 3.5 4.9 (H) 2.6 1.5   Component     Latest Ref Rng & Units 11/15/2017 05/16/2018 11/22/2018 03/19/2019  Prostate Specific Ag, Serum     0.0 - 4.0 ng/mL 0.8 1.5 2.4 2.4   Component     Latest Ref Rng & Units 05/23/2019 01/21/2020 07/16/2020  Prostate Specific Ag, Serum     0.0 - 4.0 ng/mL 2.7 2.5 2.1   Discontinued the Jalyn in 2019   2  Family history of prostate cancer -father, brother and paternal uncle have been diagnosed with prostate cancer  3. BPH with LU TS -I PSS *** -PVR ***  4. Sleep apnea -sleeps with CPAP machine  HPI: Eric Rose is a 64 y.o. male who presents today for 6 month follow up.         Score:  1-7 Mild 8-19 Moderate 20-35 Severe  PMH: Past Medical History:  Diagnosis Date  . Allergy   . Anxiety   . BPH (benign prostatic hypertrophy) with urinary obstruction    followed by urologist  . Decreased libido   . Dementia ([place])   . Erectile dysfunction 08/08/2015  . GERD (gastroesophageal reflux disease)   . History of pneumonia   . Hypertension   . Lumbago   . 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 JOINT REPLACEMENT Bilateral    hip  . ORIF FEMUR FRACTURE Right 11/26/2019   Procedure: OPEN REDUCTION INTERNAL FIXATION (ORIF) proximal FEMUR FRACTURE;  Surgeon: Lovell Sheehan, MD;  Location: [facility] OR[facility];  Service: Orthopedics;   Laterality: Right;  . 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   Procedure: TOTAL HIP ARTHROPLASTY;  Surgeon: Earnestine Leys, MD;  Location: [facility] OR[facility];  Service: Orthopedics;  Laterality: Left;    Home Medications:  Allergies as of 08/11/2020   No Known Allergies     Medication List       Accurate as of Aug 10, 2020  9:06 AM. If you have any questions, ask your nurse or doctor.        acetaminophen 500 MG tablet Commonly known as: TYLENOL Take 500-1,000 mg by mouth daily.   docusate sodium 100 MG capsule Commonly known as: COLACE Take 1 capsule (100 mg total) by mouth 2 (two) times daily.   donepezil 5 MG tablet Commonly known as: ARICEPT Take 5 mg by mouth daily with supper.   Fish Oil 1000 MG Caps Take by mouth.   lisinopril 20 MG tablet Commonly known as: ZESTRIL Take 1 tablet by mouth once daily   memantine 5 MG tablet Commonly known as: NAMENDA Take 5 mg by mouth 2 (two) times daily.   multivitamin capsule Take 1 capsule by mouth daily. 50 +   pravastatin 20 MG tablet Commonly known as: PRAVACHOL Take 1 tablet (20 mg total) by  mouth at bedtime.   tadalafil 10 MG tablet Commonly known as: CIALIS TAKE 1 TABLET BY MOUTH EVERY DAY FOR ERECTILE DYSFUNCTION.   VITAMIN B-12 PO Take 1 tablet by mouth daily.   VITAMIN D (ERGOCALCIFEROL) PO Take 1,000 mg by mouth daily.   Vitamin E 400 units Tabs Take 400 mg by mouth daily.       Allergies: No Known Allergies  Family History: Family History  Problem Relation Age of Onset  . Diabetes Mother   . Heart disease Mother   . Alzheimer's disease Mother   . Prostate cancer Father   . Prostate cancer Brother   . Prostate cancer Paternal Uncle   . Bladder Cancer Neg Hx   . Kidney cancer Neg Hx     Social History:  reports that he has never smoked. He has never used smokeless tobacco. He reports that he does not drink alcohol and does not use drugs.  ROS: For pertinent review of systems please refer to history  of present illness  Physical Exam: There were no vitals taken for this visit.  Constitutional:  Well nourished. Alert and oriented, No acute distress. HEENT: [place] AT, moist mucus membranes.  Trachea midline Cardiovascular: No clubbing, cyanosis, or edema. Respiratory: Normal respiratory effort, no increased work of breathing. GI: Abdomen is soft, non tender, non distended, no abdominal masses. Liver and spleen not palpable.  No hernias appreciated.  Stool sample for occult testing is not indicated.   GU: No CVA tenderness.  No bladder fullness or masses.  Patient with circumcised/uncircumcised phallus. ***Foreskin easily retracted***  Urethral meatus is patent.  No penile discharge. No penile lesions or rashes. Scrotum without lesions, cysts, rashes and/or edema.  Testicles are located scrotally bilaterally. No masses are appreciated in the testicles. Left and right epididymis are normal. Rectal: Patient with  normal sphincter tone. Anus and perineum without scarring or rashes. No rectal masses are appreciated. Prostate is approximately *** grams, *** nodules are appreciated. Seminal vesicles are normal. Skin: No rashes, bruises or suspicious lesions. Lymph: No inguinal adenopathy. Neurologic: Grossly intact, no focal deficits, moving all 4 extremities. Psychiatric: Normal mood and affect.   Laboratory Data: See urological history  I have reviewed the labs  Pertinent imaging ***   Assessment & Plan:    1. BPH with LUTS IPSS score is 11/3, it is worse Continue conservative management, avoiding bladder irritants and timed voiding's He is avoiding caffeine at lunch and dinner and this is assisting and decreasing nocturia PSA pending from today RTC in 6 months for IPSS, PSA and exam - if PSA is stable    No follow-ups on file.  Zara Council, PA-C  [facility] [address] [place], [place] [address] [phone]

## 2020-08-11 ENCOUNTER — Ambulatory Visit: Payer: PPO | Admitting: Urology

## 2020-08-11 DIAGNOSIS — N138 Other obstructive and reflux uropathy: Secondary | ICD-10-CM

## 2020-08-13 ENCOUNTER — Encounter: Payer: Self-pay | Admitting: Urology

## 2020-08-23 ENCOUNTER — Other Ambulatory Visit: Payer: Self-pay

## 2020-08-23 ENCOUNTER — Encounter: Payer: Self-pay | Admitting: Urology

## 2020-08-23 ENCOUNTER — Other Ambulatory Visit: Payer: PPO

## 2020-08-23 DIAGNOSIS — R972 Elevated prostate specific antigen [PSA]: Secondary | ICD-10-CM

## 2020-08-23 NOTE — Progress Notes (Signed)
08/24/2020  11:57 AM   Eric Rose February 14, 1956 [id number]  Referring provider: Towanda Malkin, MD [address] [place],  [place] [address]  Chief Complaint  Patient presents with   Benign Prostatic Hypertrophy    Urological history 1. PSA trend Component     Latest Ref Rng & Units 12/29/2014 01/12/2015 04/08/2015  Prostate Specific Ag, Serum     0.0 - 4.0 ng/mL 4.8 (H) 3.6 2.5   Component     Latest Ref Rng & Units 07/29/2015 08/23/2015 10/03/2016 03/29/2017  Prostate Specific Ag, Serum     0.0 - 4.0 ng/mL 3.5 4.9 (H) 2.6 1.5   Component     Latest Ref Rng & Units 11/15/2017 05/16/2018 11/22/2018 03/19/2019  Prostate Specific Ag, Serum     0.0 - 4.0 ng/mL 0.8 1.5 2.4 2.4   Component     Latest Ref Rng & Units 05/23/2019 01/21/2020 07/16/2020 08/23/2020  Prostate Specific Ag, Serum     0.0 - 4.0 ng/mL 2.7 2.5 2.1 2.8    Discontinued the Jalyn in 2019   2  Family history of prostate cancer -father, brother and paternal uncle have been diagnosed with prostate cancer  3. BPH with LU TS -I PSS 9/2 -PVR 118 mL   4. Sleep apnea -sleeps with CPAP machine  HPI: Eric Rose is a 65 y.o. male who presents today for 6 month follow up with his wife, Eric Rose.   He states that when he drinks water he urinates less then when he drinks coffee and tea.  Patient denies any modifying or aggravating factors.  Patient denies any gross hematuria, dysuria or suprapubic/flank pain.  Patient denies any fevers, chills, nausea or vomiting.      IPSS     Row Name 08/24/20 1100         International Prostate Symptom Score   How often have you had the sensation of not emptying your bladder? More than half the time     How often have you had to urinate less than every two hours? Less than half the time     How often have you found you stopped and started again several times when you urinated? Not at All     How often have you found it difficult to postpone urination? Not at  All     How often have you had a weak urinary stream? Not at All     How often have you had to strain to start urination? Not at All     How many times did you typically get up at night to urinate? 3 Times     Total IPSS Score 9           Quality of Life due to urinary symptoms     If you were to spend the rest of your life with your urinary condition just the way it is now how would you feel about that? Mostly Satisfied             Score:  1-7 Mild 8-19 Moderate 20-35 Severe  PMH: Past Medical History:  Diagnosis Date   Allergy    Anxiety    BPH (benign prostatic hypertrophy) with urinary obstruction    followed by urologist   Decreased libido    Dementia ([place])    Erectile dysfunction 08/08/2015   GERD (gastroesophageal reflux disease)    History of pneumonia    Hypertension    Lumbago    Vision loss of  left eye 02/21/2017   Around 2000, damage to left eye (traumatic, playing tennis)    Surgical History: Past Surgical History:  Procedure Laterality Date   JOINT REPLACEMENT Bilateral    hip   ORIF FEMUR FRACTURE Right 11/26/2019   Procedure: OPEN REDUCTION INTERNAL FIXATION (ORIF) proximal FEMUR FRACTURE;  Surgeon: Lovell Sheehan, MD;  Location: [facility] OR[facility];  Service: Orthopedics;  Laterality: Right;   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   Procedure: TOTAL HIP ARTHROPLASTY;  Surgeon: Earnestine Leys, MD;  Location: [facility] OR[facility];  Service: Orthopedics;  Laterality: Left;    Home Medications:  Allergies as of 08/24/2020   No Known Allergies      Medication List        Accurate as of August 24, 2020 11:57 AM. If you have any questions, ask your nurse or doctor.          acetaminophen 500 MG tablet Commonly known as: TYLENOL Take 500-1,000 mg by mouth daily.   docusate sodium 100 MG capsule Commonly known as: COLACE Take 1 capsule (100 mg total) by mouth 2 (two) times daily.   donepezil 5 MG tablet Commonly known as: ARICEPT Take 5 mg by mouth daily with  supper.   Fish Oil 1000 MG Caps Take by mouth.   lisinopril 20 MG tablet Commonly known as: ZESTRIL Take 1 tablet by mouth once daily   memantine 5 MG tablet Commonly known as: NAMENDA Take 5 mg by mouth 2 (two) times daily.   multivitamin capsule Take 1 capsule by mouth daily. 50 +   pravastatin 20 MG tablet Commonly known as: PRAVACHOL Take 1 tablet (20 mg total) by mouth at bedtime.   tadalafil 10 MG tablet Commonly known as: CIALIS TAKE 1 TABLET BY MOUTH EVERY DAY FOR ERECTILE DYSFUNCTION.   VITAMIN B-12 PO Take 1 tablet by mouth daily.   VITAMIN D (ERGOCALCIFEROL) PO Take 1,000 mg by mouth daily.   Vitamin E 400 units Tabs Take 400 mg by mouth daily.        Allergies: No Known Allergies  Family History: Family History  Problem Relation Age of Onset   Diabetes Mother    Heart disease Mother    Alzheimer's disease Mother    Prostate cancer Father    Prostate cancer Brother    Prostate cancer Paternal Uncle    Bladder Cancer Neg Hx    Kidney cancer Neg Hx     Social History:  reports that he has never smoked. He has never used smokeless tobacco. He reports that he does not drink alcohol and does not use drugs.  ROS: For pertinent review of systems please refer to history of present illness  Physical Exam: BP 124/84   Pulse 87   Ht 5\' 10"  (1.778 m)   Wt 180 lb (81.6 kg)   BMI 25.83 kg/m   Constitutional:  Well nourished. Alert and oriented, No acute distress. HEENT: [place] AT, mask in place  Trachea midline Cardiovascular: No clubbing, cyanosis, or edema. Respiratory: Normal respiratory effort, no increased work of breathing. GU: No CVA tenderness.  No bladder fullness or masses.  Patient with circumcised phallus. Urethral meatus is patent.  No penile discharge. No penile lesions or rashes. Scrotum without lesions, cysts, rashes and/or edema.  Testicles are located scrotally bilaterally. No masses are appreciated in the testicles. Left and right  epididymis are normal. Rectal: Patient with  normal sphincter tone. Anus and perineum without scarring or rashes. No rectal masses are appreciated. Prostate is  approximately 60 + grams, could only palpate the apex and the midportion of the gland, no nodules are appreciated. Seminal vesicles could not be palpated Lymph: No inguinal adenopathy. Neurologic: Grossly intact, no focal deficits, moving all 4 extremities. Psychiatric: Normal mood and affect.   Laboratory Data: See urological history  I have reviewed the labs  Pertinent imaging Results for Eric Rose, Eric Rose (MRN [id number]) as of 08/24/2020 11:54  Ref. Range 08/24/2020 11:38  Scan Result Unknown 118     Assessment & Plan:    1. BPH with LUTS -Discussed with the wife and the patient the pathophysiology BPH and LUTS -He will continue to try to drink more water instead of caffeinated beverages as they tend to worsen his urinary symptoms -He does not desire to restart any medication or undergo evaluation for bladder outlet procedure at this time   Return in about 6 months (around 02/23/2021) for IPSS, PSA, PVR and exam.  Zara Council, [facility]  [place], [place] [address] [phone]

## 2020-08-24 ENCOUNTER — Ambulatory Visit (INDEPENDENT_AMBULATORY_CARE_PROVIDER_SITE_OTHER): Payer: PPO | Admitting: Urology

## 2020-08-24 ENCOUNTER — Encounter: Payer: Self-pay | Admitting: Urology

## 2020-08-24 VITALS — BP 124/84 | HR 87 | Ht 70.0 in | Wt 180.0 lb

## 2020-08-24 DIAGNOSIS — N138 Other obstructive and reflux uropathy: Secondary | ICD-10-CM | POA: Diagnosis not present

## 2020-08-24 DIAGNOSIS — N401 Enlarged prostate with lower urinary tract symptoms: Secondary | ICD-10-CM

## 2020-08-24 LAB — BLADDER SCAN AMB NON-IMAGING: Scan Result: 118

## 2020-08-24 LAB — PSA: Prostate Specific Ag, Serum: 2.8 ng/mL (ref 0.0–4.0)

## 2020-10-25 ENCOUNTER — Telehealth: Payer: Self-pay | Admitting: Family Medicine

## 2020-10-25 DIAGNOSIS — I10 Essential (primary) hypertension: Secondary | ICD-10-CM

## 2020-10-25 NOTE — Telephone Encounter (Signed)
Pt is scheduled °

## 2020-10-27 ENCOUNTER — Ambulatory Visit (INDEPENDENT_AMBULATORY_CARE_PROVIDER_SITE_OTHER): Payer: PPO | Admitting: Family Medicine

## 2020-10-27 ENCOUNTER — Encounter: Payer: Self-pay | Admitting: Family Medicine

## 2020-10-27 ENCOUNTER — Other Ambulatory Visit: Payer: Self-pay

## 2020-10-27 VITALS — BP 118/74 | HR 90 | Temp 98.1°F | Resp 16 | Ht 70.0 in | Wt 172.9 lb

## 2020-10-27 DIAGNOSIS — N401 Enlarged prostate with lower urinary tract symptoms: Secondary | ICD-10-CM | POA: Diagnosis not present

## 2020-10-27 DIAGNOSIS — N138 Other obstructive and reflux uropathy: Secondary | ICD-10-CM

## 2020-10-27 DIAGNOSIS — N529 Male erectile dysfunction, unspecified: Secondary | ICD-10-CM

## 2020-10-27 DIAGNOSIS — E782 Mixed hyperlipidemia: Secondary | ICD-10-CM

## 2020-10-27 DIAGNOSIS — G4733 Obstructive sleep apnea (adult) (pediatric): Secondary | ICD-10-CM

## 2020-10-27 DIAGNOSIS — I10 Essential (primary) hypertension: Secondary | ICD-10-CM | POA: Diagnosis not present

## 2020-10-27 LAB — BASIC METABOLIC PANEL
BUN: 16 mg/dL (ref 7–25)
CO2: 29 mmol/L (ref 20–32)
Calcium: 8.9 mg/dL (ref 8.6–10.3)
Chloride: 105 mmol/L (ref 98–110)
Creat: 0.94 mg/dL (ref 0.70–1.35)
Glucose, Bld: 61 mg/dL — ABNORMAL LOW (ref 65–99)
Potassium: 4.2 mmol/L (ref 3.5–5.3)
Sodium: 139 mmol/L (ref 135–146)

## 2020-10-27 MED ORDER — PRAVASTATIN SODIUM 20 MG PO TABS: 20.0000 mg | ORAL_TABLET | Freq: Every day | ORAL | 3 refills | Status: DC

## 2020-10-27 MED ORDER — LISINOPRIL 20 MG PO TABS: 20.0000 mg | ORAL_TABLET | Freq: Every day | ORAL | 3 refills | Status: DC

## 2020-10-27 MED ORDER — TADALAFIL 10 MG PO TABS: ORAL_TABLET | ORAL | 2 refills | Status: DC

## 2020-10-27 NOTE — Assessment & Plan Note (Signed)
Tolerant of statin, recheck labs today. Interested in coming off of statin if able to.

## 2020-10-27 NOTE — Assessment & Plan Note (Signed)
On cialis with good effect, refills provided.

## 2020-10-27 NOTE — Progress Notes (Signed)
    SUBJECTIVE:   CHIEF COMPLAINT / HPI:   Hypertension, OSA: - Medications: lisinopril '20mg'$  - Compliance: good - Checking BP at home: yes, 120/80 - Denies any SOB, CP, vision changes, LE edema, medication SEs, or symptoms of hypotension - Exercise: pushups, situps - wears CPAP  HLD - medications: pravastatin - compliance: good  - medication SEs: none  Benign Prostatic Hypertrophy - Medications: none - follows with Urology, last visit 6/14. - Symptoms:  no complicating symptoms - no personal history of prostate cancer but with h/o elevated PSA - + family history of prostate cancer (brother, father, uncle)  ED - taking cialis daily prn with good effect. Usually will do situps and pushups prior to sexual activity and will help.    OBJECTIVE:   BP 118/74   Pulse 90   Temp 98.1 F (36.7 C) (Oral)   Resp 16   Ht '5\' 10"'$  (1.778 m)   Wt 172 lb 14.4 oz (78.4 kg)   SpO2 97%   BMI 24.81 kg/m   Gen: well appearing, in NAD Card: RRR Lungs: CTAB Ext: WWP, trace edema   ASSESSMENT/PLAN:   Essential hypertension, benign Doing well on current regimen, no changes made today. Obtaining labs today.  Obstructive sleep apnea Compliant with CPAP, continue.  BPH with obstruction/lower urinary tract symptoms Doing well, not currently on medications. Continue to follow with urology.  Hyperlipidemia Tolerant of statin, recheck labs today. Interested in coming off of statin if able to.  Erectile dysfunction On cialis with good effect, refills provided.     Myles Gip, DO

## 2020-10-27 NOTE — Assessment & Plan Note (Signed)
Doing well, not currently on medications. Continue to follow with urology.

## 2020-10-27 NOTE — Assessment & Plan Note (Signed)
Doing well on current regimen, no changes made today. Obtaining labs today.

## 2020-10-27 NOTE — Patient Instructions (Signed)
It was great to see you!  Our plans for today:  - No changes to your medications. We sent refills to your pharmacy.  - We are checking some labs today, we will call you with these results.   Take care and seek immediate care sooner if you develop any concerns.   Dr. Ky Barban

## 2020-10-27 NOTE — Assessment & Plan Note (Signed)
Compliant with CPAP, continue.  

## 2020-11-04 ENCOUNTER — Other Ambulatory Visit: Payer: Self-pay | Admitting: Urology

## 2020-11-04 DIAGNOSIS — N138 Other obstructive and reflux uropathy: Secondary | ICD-10-CM

## 2021-01-06 ENCOUNTER — Ambulatory Visit: Payer: Self-pay | Admitting: *Deleted

## 2021-01-06 ENCOUNTER — Encounter: Payer: Self-pay | Admitting: Unknown Physician Specialty

## 2021-01-06 ENCOUNTER — Ambulatory Visit (INDEPENDENT_AMBULATORY_CARE_PROVIDER_SITE_OTHER): Payer: PPO | Admitting: Unknown Physician Specialty

## 2021-01-06 ENCOUNTER — Other Ambulatory Visit: Payer: Self-pay

## 2021-01-06 VITALS — BP 120/64 | HR 86 | Temp 97.6°F | Resp 16 | Ht 71.0 in | Wt 173.0 lb

## 2021-01-06 DIAGNOSIS — R221 Localized swelling, mass and lump, neck: Secondary | ICD-10-CM | POA: Diagnosis not present

## 2021-01-06 DIAGNOSIS — M89319 Hypertrophy of bone, unspecified shoulder: Secondary | ICD-10-CM

## 2021-01-06 NOTE — Telephone Encounter (Signed)
Patient is calling to report he is having muscle/skin changes in neck and shoulder area. Patient states he can feel his bones. Patient is concerned because this is new. Patient states he can take Tylenol and lay down and the skin tone gets better- but improvement does not last.  Appointment scheduled for evaluation of changes.

## 2021-01-06 NOTE — Progress Notes (Signed)
BP 120/64   Pulse 86   Temp 97.6 F (36.4 C)   Resp 16   Ht 5\' 11"  (1.803 m)   Wt 173 lb (78.5 kg)   SpO2 98%   BMI 24.13 kg/m    Subjective:    Patient ID: Eric Rose, male    DOB: 1956-03-10, 65 y.o.   MRN: [id number]  HPI: Eric Rose is a 65 y.o. male  No chief complaint on file.  Pt is here for concerns about changes in bilateral clavicles.  Noticed this 2 weeks ago.  No pain, no fever or chills, no change in cough, never smoker.  I note a 8 pound weight loss in the last 4 month which is non purposeful but none in the last 2 months.  States Tylenol seems to make it look more normal.  Worries that he got a flu shot 2 months ago and it is an effect of that.  Admits not using his CPAP but feels like he is sleeping and feeling good.    Relevant past medical, surgical, family and social history reviewed and updated as indicated. Interim medical history since our last visit reviewed. Allergies and medications reviewed and updated.  Review of Systems  Per HPI unless specifically indicated above     Objective:    BP 120/64   Pulse 86   Temp 97.6 F (36.4 C)   Resp 16   Ht 5\' 11"  (1.803 m)   Wt 173 lb (78.5 kg)   SpO2 98%   BMI 24.13 kg/m   Wt Readings from Last 3 Encounters:  01/06/21 173 lb (78.5 kg)  10/27/20 172 lb 14.4 oz (78.4 kg)  08/24/20 180 lb (81.6 kg)    Physical Exam Constitutional:      General: He is not in acute distress.    Appearance: Normal appearance. He is well-developed.  HENT:     Head: Normocephalic and atraumatic.  Eyes:     General: Lids are normal. No scleral icterus.       Right eye: No discharge.        Left eye: No discharge.     Conjunctiva/sclera: Conjunctivae normal.  Neck:     Vascular: No carotid bruit or JVD.  Cardiovascular:     Rate and Rhythm: Normal rate and regular rhythm.     Heart sounds: Normal heart sounds.  Pulmonary:     Effort: Pulmonary effort is normal. No respiratory distress.     Breath  sounds: Normal breath sounds.  Abdominal:     Palpations: There is no hepatomegaly or splenomegaly.  Musculoskeletal:        General: Normal range of motion.     Cervical back: Normal range of motion and neck supple. No signs of trauma. Normal range of motion.     Comments: Prominent clavicles but no symphadenopathy or masses appreciated  Lymphadenopathy:     Cervical: No cervical adenopathy.     Right cervical: No superficial, deep or posterior cervical adenopathy.    Left cervical: No superficial, deep or posterior cervical adenopathy.  Skin:    General: Skin is warm and dry.     Coloration: Skin is not pale.     Findings: No rash.  Neurological:     Mental Status: He is alert and oriented to person, place, and time.  Psychiatric:        Behavior: Behavior normal.        Thought Content: Thought content normal.  Judgment: Judgment normal.    Results for orders placed or performed in visit on 63/89/37  Basic Metabolic Panel (BMET)  Result Value Ref Range   Glucose, Bld 61 (L) 65 - 99 mg/dL   BUN 16 7 - 25 mg/dL   Creat 0.94 0.70 - 1.35 mg/dL   BUN/Creatinine Ratio NOT APPLICABLE 6 - 22 (calc)   Sodium 139 135 - 146 mmol/L   Potassium 4.2 3.5 - 5.3 mmol/L   Chloride 105 98 - 110 mmol/L   CO2 29 20 - 32 mmol/L   Calcium 8.9 8.6 - 10.3 mg/dL      Assessment & Plan:   Problem List Items Addressed This Visit   None Visit Diagnoses     Clavicle enlargement    -  Primary   No appreciable lymphadenopathy or masses. Chck CBC, TSH, and chest x-ray to further assess for lymphedema   Relevant Orders   CBC with Differential/Platelet   TSH   DG Chest 2 View        Follow up plan: Return if symptoms worsen or fail to improve.

## 2021-01-06 NOTE — Telephone Encounter (Signed)
Reason for Disposition  Nursing judgment or information in reference  Answer Assessment - Initial Assessment Questions 1. REASON FOR CALL: "What is your main concern right now?"     Changes in the neck- seems to be loosing neck muscle tone 2. ONSET: "When did the changes start?"     1-2 weeks 3. SEVERITY: "How bad is the change?"     Bothersome- no pain or abnormally felt- can see the bone 4. FEVER: "Do you have a fever?"     no 5. OTHER SYMPTOMS: "Do you have any other new symptoms?"     none 6. TREATMENTS AND RESPONSE: "What have you done so far to try to make this better? What medicines have you used?"     Tylenol and rest seems to help tone 7. PREGNANCY: "Is there any chance you are pregnant?" "When was your last menstrual period?"     na  Protocols used: No Guideline Available-A-AH

## 2021-01-07 LAB — TSH: TSH: 1.93 mIU/L (ref 0.40–4.50)

## 2021-01-07 LAB — CBC WITH DIFFERENTIAL/PLATELET
Absolute Monocytes: 703 cells/uL (ref 200–950)
Basophils Absolute: 21 cells/uL (ref 0–200)
Basophils Relative: 0.3 %
Eosinophils Absolute: 412 cells/uL (ref 15–500)
Eosinophils Relative: 5.8 %
HCT: 43.8 % (ref 38.5–50.0)
Hemoglobin: 14.5 g/dL (ref 13.2–17.1)
Lymphs Abs: 1399 cells/uL (ref 850–3900)
MCH: 31.1 pg (ref 27.0–33.0)
MCHC: 33.1 g/dL (ref 32.0–36.0)
MCV: 94 fL (ref 80.0–100.0)
MPV: 11.8 fL (ref 7.5–12.5)
Monocytes Relative: 9.9 %
Neutro Abs: 4565 cells/uL (ref 1500–7800)
Neutrophils Relative %: 64.3 %
Platelets: 229 10*3/uL (ref 140–400)
RBC: 4.66 10*6/uL (ref 4.20–5.80)
RDW: 12.1 % (ref 11.0–15.0)
Total Lymphocyte: 19.7 %
WBC: 7.1 10*3/uL (ref 3.8–10.8)

## 2021-02-21 ENCOUNTER — Encounter: Payer: Self-pay | Admitting: Urology

## 2021-02-21 ENCOUNTER — Other Ambulatory Visit: Payer: PPO

## 2021-02-22 NOTE — Progress Notes (Deleted)
02/22/2021  9:55 PM   Eric Rose Dec 28, 1955 [id number]  Referring provider: Towanda Malkin, MD [place],  [place] [address]  No chief complaint on file.  Urological history: 1. Elevated PSA -PSA trend 4.8 on 12/29/2014.   Component     Latest Ref Rng & Units 10/03/2016 03/29/2017 11/15/2017 05/16/2018  Prostate Specific Ag, Serum     0.0 - 4.0 ng/mL 2.6 1.5 0.8 1.5   Component     Latest Ref Rng & Units 11/22/2018 03/19/2019 05/23/2019 01/21/2020  Prostate Specific Ag, Serum     0.0 - 4.0 ng/mL 2.4 2.4 2.7 2.5   Component     Latest Ref Rng & Units 07/16/2020 08/23/2020  Prostate Specific Ag, Serum     0.0 - 4.0 ng/mL 2.1 2.8  Discontinued the Jalyn in 2019   2  Family history of prostate cancer -father, brother and paternal uncle have been diagnosed with prostate cancer  3. BPH with LU TS -I PSS *** -PVR *** mL    HPI: Eric Rose is a 65 y.o. male who presents today for 6 month follow up with his wife, Eric Rose.        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 2000, damage to left eye (traumatic, playing tennis)    Surgical History: Past Surgical History:  Procedure Laterality Date   JOINT REPLACEMENT Bilateral    hip   ORIF FEMUR FRACTURE Right 11/26/2019   Procedure: OPEN REDUCTION INTERNAL FIXATION (ORIF) proximal FEMUR FRACTURE;  Surgeon: Lovell Sheehan, MD;  Location: [facility] ORS;  Service: Orthopedics;  Laterality: Right;   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 If you have any questions, ask your nurse or doctor.          acetaminophen 500 MG tablet Commonly known as: TYLENOL Take 500-1,000 mg by mouth daily.   donepezil 5 MG tablet Commonly known as: ARICEPT Take 5 mg by mouth daily with supper.   Fish Oil 1000 MG Caps Take by mouth.   lisinopril 20 MG tablet Commonly known as: ZESTRIL Take 1 tablet (20 mg total) by mouth daily.   memantine 5 MG tablet Commonly known as: NAMENDA Take 5 mg by mouth 2 (two) times daily.   multivitamin capsule Take 1 capsule by mouth daily. 50 +   pravastatin 20 MG tablet Commonly known as: PRAVACHOL Take 1 tablet (20 mg total) by mouth at bedtime.   tadalafil 10 MG tablet Commonly known as: CIALIS TAKE 1 TABLET BY MOUTH  30 MINUTES PRIOR TO PLANNED ACTIVITY.   VITAMIN B-12 PO Take 1 tablet by mouth daily.   VITAMIN D (ERGOCALCIFEROL) PO Take 1,000 mg by mouth daily.  Vitamin E 400 units Tabs Take 400 mg by mouth daily.        Allergies: No Known Allergies  Family History: Family History  Problem Relation Age of Onset   Diabetes Mother    Heart disease Mother    Alzheimer's disease Mother    Prostate cancer Father    Prostate cancer Brother    Prostate cancer Paternal Uncle    Bladder Cancer Neg Hx    Kidney cancer Neg Hx     Social History:  reports that he has never smoked. He has never used smokeless tobacco. He reports that he does not drink alcohol and does not use drugs.  ROS: For pertinent review of systems please refer to history of present illness  Physical Exam: There were no vitals taken for this visit.  Constitutional:  Well nourished. Alert and oriented, No acute distress. HEENT: [place] AT, moist mucus membranes.  Trachea midline Cardiovascular: No clubbing, cyanosis,  or edema. Respiratory: Normal respiratory effort, no increased work of breathing. GI: Abdomen is soft, non tender, non distended, no abdominal masses. Liver and spleen not palpable.  No hernias appreciated.  Stool sample for occult testing is not indicated.   GU: No CVA tenderness.  No bladder fullness or masses.  Patient with circumcised/uncircumcised phallus. ***Foreskin easily retracted***  Urethral meatus is patent.  No penile discharge. No penile lesions or rashes. Scrotum without lesions, cysts, rashes and/or edema.  Testicles are located scrotally bilaterally. No masses are appreciated in the testicles. Left and right epididymis are normal. Rectal: Patient with  normal sphincter tone. Anus and perineum without scarring or rashes. No rectal masses are appreciated. Prostate is approximately *** grams, *** nodules are appreciated. Seminal vesicles are normal. Skin: No rashes, bruises or suspicious lesions. Lymph: No inguinal adenopathy. Neurologic: Grossly intact, no focal deficits, moving all 4 extremities. Psychiatric: Normal mood and affect.    Laboratory Data: Component     Latest Ref Rng & Units 01/06/2021  TSH     0.40 - 4.50 mIU/L 1.93   Component     Latest Ref Rng & Units 01/06/2021  WBC     3.8 - 10.8 Thousand/uL 7.1  RBC     4.20 - 5.80 Million/uL 4.66  Hemoglobin     13.2 - 17.1 g/dL 14.5  HCT     38.5 - 50.0 % 43.8  MCV     80.0 - 100.0 fL 94.0  MCH     27.0 - 33.0 pg 31.1  MCHC     32.0 - 36.0 g/dL 33.1  RDW     11.0 - 15.0 % 12.1  Platelets     140 - 400 Thousand/uL 229  MPV     7.5 - 12.5 fL 11.8  NEUT#     1,500 - 7,800 cells/uL 4,565  Lymphocyte #     850 - 3,900 cells/uL 1,399  WBC mixed population     200 - 950 cells/uL   Eosinophils Absolute     15 - 500 cells/uL 412  Basophils Absolute     0 - 200 cells/uL 21  Neutrophils     % 64.3  Total Lymphocyte     % 19.7  Monocytes Relative     % 9.9  Eosinophil     % 5.8  Basophil     % 0.3   Absolute Monocytes     200 - 950 cells/uL 703  nRBC     0.0 - 0.2 %   WBC,  UA     0 - 5 /hpf   Epithelial Cells (non renal)     0 - 10 /hpf   Bacteria, UA     None seen/Few    Component     Latest Ref Rng & Units 10/27/2020  Glucose     65 - 99 mg/dL 61 (L)  BUN     7 - 25 mg/dL 16  Creatinine     0.70 - 1.35 mg/dL 0.94  BUN/Creatinine Ratio     6 - 22 (calc) NOT APPLICABLE  Sodium     453 - 146 mmol/L 139  Potassium     3.5 - 5.3 mmol/L 4.2  Chloride     98 - 110 mmol/L 105  CO2     20 - 32 mmol/L 29  Calcium     8.6 - 10.3 mg/dL 8.9  I have reviewed the labs  Pertinent imaging ***    Assessment & Plan:    1. BPH with LUTS -PSA pending -DRE benign -PVR < 300 cc -symptoms - *** -most bothersome symptoms are *** -continue conservative management, avoiding bladder irritants and timed voiding's -Initiate alpha-blocker (***), discussed side effects *** -Initiate 5 alpha reductase inhibitor (***), discussed side effects *** -Continue tamsulosin 0.4 mg daily, alfuzosin 10 mg daily, Rapaflo 8 mg daily, terazosin, doxazosin, Cialis 5 mg daily and finasteride 5 mg daily, dutasteride 0.5 mg daily***:refills given -Cannot tolerate medication or medication failure, schedule cystoscopy ***     No follow-ups on file.  Zara Council, PA-C  [facility] [address] [place], [place] [address] [phone]

## 2021-02-23 ENCOUNTER — Ambulatory Visit: Payer: PPO | Admitting: Urology

## 2021-02-23 DIAGNOSIS — N401 Enlarged prostate with lower urinary tract symptoms: Secondary | ICD-10-CM | POA: Diagnosis not present

## 2021-02-23 DIAGNOSIS — N138 Other obstructive and reflux uropathy: Secondary | ICD-10-CM

## 2021-02-24 LAB — PSA: Prostate Specific Ag, Serum: 2.3 ng/mL (ref 0.0–4.0)

## 2021-03-01 ENCOUNTER — Ambulatory Visit: Payer: PPO

## 2021-03-02 ENCOUNTER — Telehealth: Payer: Self-pay

## 2021-03-02 NOTE — Telephone Encounter (Signed)
Copied from [place] [id number]. Topic: General - Other >> Mar 02, 2021  9:10 AM Leward Quan A wrote: Reason for CRM: Patient called in needing to reschedule his missed AWV on 03/01/21 asking for a call back to reschedule Ph# [phone]

## 2021-03-02 NOTE — Telephone Encounter (Signed)
Resch appt for patient

## 2021-03-02 NOTE — Progress Notes (Signed)
03/03/2021  3:57 PM   Eric Rose 02/11/1956 [id number]  Referring provider: Towanda Malkin, MD [address] [place],  [place] [address]  Chief Complaint  Patient presents with   Benign Prostatic Hypertrophy    Urological history: 1. Elevated PSA -PSA trend 4.8 on 12/29/2014.   Component     Latest Ref Rng & Units 10/03/2016 03/29/2017 11/15/2017 05/16/2018  Prostate Specific Ag, Serum     0.0 - 4.0 ng/mL 2.6 1.5 0.8 1.5   Component     Latest Ref Rng & Units 11/22/2018 03/19/2019 05/23/2019 01/21/2020  Prostate Specific Ag, Serum     0.0 - 4.0 ng/mL 2.4 2.4 2.7 2.5   Component     Latest Ref Rng & Units 07/16/2020 08/23/2020  Prostate Specific Ag, Serum     0.0 - 4.0 ng/mL 2.1 2.8   Component     Latest Ref Rng & Units 02/23/2021  Prostate Specific Ag, Serum     0.0 - 4.0 ng/mL 2.3   Discontinued the Jalyn in 2019   2  Family history of prostate cancer -father, brother and paternal uncle have been diagnosed with prostate cancer  3. BPH with LU TS -PSA 2.3 in 02/2021  -I PSS 9/2 -PVR 4 mL    HPI: Eric Rose is a 65 y.o. male who presents today for 6 month follow up.  He has been having urinary frequency and urgency dependent on fluid intake.  Patient denies any modifying or aggravating factors.  Patient denies any gross hematuria, dysuria or suprapubic/flank pain.  Patient denies any fevers, chills, nausea or vomiting.     IPSS     Row Name 03/03/21 1500         International Prostate Symptom Score   How often have you had the sensation of not emptying your bladder? Less than half the time     How often have you had to urinate less than every two hours? Less than half the time     How often have you found you stopped and started again several times when you urinated? Not at All     How often have you found it difficult to postpone urination? Less than half the time     How often have you had a weak urinary stream? Less than 1 in 5 times      How often have you had to strain to start urination? Not at All     How many times did you typically get up at night to urinate? 2 Times     Total IPSS Score 9       Quality of Life due to urinary symptoms   If you were to spend the rest of your life with your urinary condition just the way it is now how would you feel about that? 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 2000, damage to left eye (traumatic, playing tennis)  Surgical History: Past Surgical History:  Procedure Laterality Date   JOINT REPLACEMENT Bilateral    hip   ORIF FEMUR FRACTURE Right 11/26/2019   Procedure: OPEN REDUCTION INTERNAL FIXATION (ORIF) proximal FEMUR FRACTURE;  Surgeon: Lovell Sheehan, MD;  Location: [facility];  Service: Orthopedics;  Laterality: Right;   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 If you have any questions, ask your nurse or doctor.          STOP taking these medications    Fish Oil 1000 MG Caps Stopped by: Zaion Hreha, PA-C       TAKE these medications    acetaminophen 500 MG tablet Commonly known as: TYLENOL Take 500-1,000 mg by mouth daily.   donepezil 5 MG tablet Commonly known as: ARICEPT Take 5 mg by mouth daily with supper.   lisinopril 20 MG tablet Commonly known as:  ZESTRIL Take 1 tablet (20 mg total) by mouth daily.   memantine 5 MG tablet Commonly known as: NAMENDA Take 5 mg by mouth 2 (two) times daily.   multivitamin capsule Take 1 capsule by mouth daily. 50 +   pravastatin 20 MG tablet Commonly known as: PRAVACHOL Take 1 tablet (20 mg total) by mouth at bedtime.   tadalafil 10 MG tablet Commonly known as: CIALIS TAKE 1 TABLET BY MOUTH  30 MINUTES PRIOR TO PLANNED ACTIVITY.   VITAMIN B-12 PO Take 1 tablet by mouth daily.   VITAMIN D (ERGOCALCIFEROL) PO Take 1,000 mg by mouth daily.   Vitamin E 400 units Tabs Take 400 mg by mouth daily.        Allergies: No Known Allergies  Family History: Family History  Problem Relation Age of Onset   Diabetes Mother    Heart disease Mother    Alzheimer's disease Mother    Prostate cancer Father    Prostate cancer Brother    Prostate cancer Paternal Uncle    Bladder Cancer Neg Hx    Kidney cancer Neg Hx     Social History:  reports that he has never smoked. He has never used smokeless tobacco. He reports that he does not drink alcohol and does not use drugs.  ROS: For pertinent review of systems please refer to history of present illness  Physical Exam: BP 125/79    Pulse 85   Constitutional:  Well nourished. Alert and oriented, No acute distress. HEENT: [place] AT, mask in place.  Trachea midline Cardiovascular: No clubbing, cyanosis, or edema. Respiratory: Normal respiratory effort, no increased work of breathing. Neurologic: Grossly intact, no focal deficits, moving all 4 extremities. Psychiatric: Normal mood and affect.    Laboratory Data: Component     Latest Ref Rng & Units 01/06/2021  TSH     0.40 - 4.50 mIU/L 1.93   Component     Latest Ref Rng & Units 01/06/2021  WBC     3.8 - 10.8 Thousand/uL 7.1  RBC     4.20 - 5.80 Million/uL 4.66  Hemoglobin     13.2 - 17.1 g/dL 14.5  HCT     38.5 - 50.0 % 43.8  MCV     80.0 - 100.0 fL 94.0  MCH     27.0 - 33.0 pg 31.1   MCHC     32.0 - 36.0 g/dL 33.1  RDW     11.0 - 15.0 % 12.1  Platelets     140 - 400 Thousand/uL 229  MPV     7.5 - 12.5 fL 11.8  NEUT#     1,500 - 7,800 cells/uL 4,565  Lymphocyte #     850 - 3,900 cells/uL 1,399  WBC mixed population     200 - 950 cells/uL   Eosinophils Absolute     15 - 500 cells/uL 412  Basophils Absolute     0 - 200 cells/uL 21  Neutrophils     % 64.3  Total Lymphocyte     % 19.7  Monocytes Relative     % 9.9  Eosinophil     % 5.8  Basophil     % 0.3  Absolute Monocytes     200 - 950 cells/uL 703  nRBC     0.0 - 0.2 %   WBC, UA     0 - 5 /hpf   Epithelial Cells (non renal)     0 - 10 /hpf   Bacteria, UA     None seen/Few    Component     Latest Ref Rng & Units 10/27/2020  Glucose     65 - 99 mg/dL 61 (L)  BUN     7 - 25 mg/dL 16  Creatinine     0.70 - 1.35 mg/dL 0.94  BUN/Creatinine Ratio     6 - 22 (calc) NOT APPLICABLE  Sodium     793 - 146 mmol/L 139  Potassium     3.5 - 5.3 mmol/L 4.2  Chloride     98 - 110 mmol/L 105  CO2     20 - 32 mmol/L 29  Calcium     8.6 - 10.3 mg/dL 8.9  I have reviewed the labs  Pertinent imaging  03/03/21 15:41  Scan Result 4    Assessment & Plan:    1. BPH with LUTS -PSA stable  -symptoms - frequency and urgency -continue conservative management, avoiding bladder irritants and timed voiding's   Return in about 1 year (around 03/03/2022) for IPSS, PSA and exam.  Zara Council, [facility]  [address] [place], [place] [address] [phone]

## 2021-03-03 ENCOUNTER — Encounter: Payer: Self-pay | Admitting: Urology

## 2021-03-03 ENCOUNTER — Other Ambulatory Visit: Payer: Self-pay

## 2021-03-03 ENCOUNTER — Ambulatory Visit: Payer: PPO | Admitting: Urology

## 2021-03-03 VITALS — BP 125/79 | HR 85

## 2021-03-03 DIAGNOSIS — N138 Other obstructive and reflux uropathy: Secondary | ICD-10-CM | POA: Diagnosis not present

## 2021-03-03 DIAGNOSIS — N401 Enlarged prostate with lower urinary tract symptoms: Secondary | ICD-10-CM

## 2021-03-03 LAB — BLADDER SCAN AMB NON-IMAGING: Scan Result: 4

## 2021-03-10 ENCOUNTER — Ambulatory Visit (INDEPENDENT_AMBULATORY_CARE_PROVIDER_SITE_OTHER): Payer: PPO

## 2021-03-10 VITALS — BP 122/84 | HR 95 | Temp 98.1°F | Resp 16 | Ht 71.0 in | Wt 174.9 lb

## 2021-03-10 DIAGNOSIS — Z0000 Encounter for general adult medical examination without abnormal findings: Secondary | ICD-10-CM

## 2021-03-10 NOTE — Patient Instructions (Addendum)
Mr. Brisby , Thank you for taking time to come for your Medicare Wellness Visit. I appreciate your ongoing commitment to your health goals. Please review the following plan we discussed and let me know if I can assist you in the future.   Screening recommendations/referrals: Colonoscopy: done 2014. Repeat in 2024 Recommended yearly ophthalmology/optometry visit for glaucoma screening and checkup Recommended yearly dental visit for hygiene and checkup  Vaccinations: Influenza vaccine: completed; need records Pneumococcal vaccine: due Tdap vaccine: done 03/12/18 Shingles vaccine: Shingrix discussed. Please contact your pharmacy for coverage information.  Covid-19: done 05/15/19 & 06/04/19  Advanced directives: Please bring a copy of your health care power of attorney and living will to the office at your convenience.   Conditions/risks identified: Keep up the great work!  Next appointment: Follow up in one year for your annual wellness visit.   Preventive Care 67 Years and Older, Male Preventive care refers to lifestyle choices and visits with your health care provider that can promote health and wellness. What does preventive care include? A yearly physical exam. This is also called an annual well check. Dental exams once or twice a year. Routine eye exams. Ask your health care provider how often you should have your eyes checked. Personal lifestyle choices, including: Daily care of your teeth and gums. Regular physical activity. Eating a healthy diet. Avoiding tobacco and drug use. Limiting alcohol use. Practicing safe sex. Taking low doses of aspirin every day. Taking vitamin and mineral supplements as recommended by your health care provider. What happens during an annual well check? The services and screenings done by your health care provider during your annual well check will depend on your age, overall health, lifestyle risk factors, and family history of  disease. Counseling  Your health care provider may ask you questions about your: Alcohol use. Tobacco use. Drug use. Emotional well-being. Home and relationship well-being. Sexual activity. Eating habits. History of falls. Memory and ability to understand (cognition). Work and work Statistician. Screening  You may have the following tests or measurements: Height, weight, and BMI. Blood pressure. Lipid and cholesterol levels. These may be checked every 5 years, or more frequently if you are over 86 years old. Skin check. Lung cancer screening. You may have this screening every year starting at age 32 if you have a 30-pack-year history of smoking and currently smoke or have quit within the past 15 years. Fecal occult blood test (FOBT) of the stool. You may have this test every year starting at age 49. Flexible sigmoidoscopy or colonoscopy. You may have a sigmoidoscopy every 5 years or a colonoscopy every 10 years starting at age 31. Prostate cancer screening. Recommendations will vary depending on your family history and other risks. Hepatitis C blood test. Hepatitis B blood test. Sexually transmitted disease (STD) testing. Diabetes screening. This is done by checking your blood sugar (glucose) after you have not eaten for a while (fasting). You may have this done every 1-3 years. Abdominal aortic aneurysm (AAA) screening. You may need this if you are a current or former smoker. Osteoporosis. You may be screened starting at age 38 if you are at high risk. Talk with your health care provider about your test results, treatment options, and if necessary, the need for more tests. Vaccines  Your health care provider may recommend certain vaccines, such as: Influenza vaccine. This is recommended every year. Tetanus, diphtheria, and acellular pertussis (Tdap, Td) vaccine. You may need a Td booster every 10 years. Zoster vaccine. You  may need this after age 96. Pneumococcal 13-valent  conjugate (PCV13) vaccine. One dose is recommended after age 66. Pneumococcal polysaccharide (PPSV23) vaccine. One dose is recommended after age 56. Talk to your health care provider about which screenings and vaccines you need and how often you need them. This information is not intended to replace advice given to you by your health care provider. Make sure you discuss any questions you have with your health care provider. Document Released: 03/26/2015 Document Revised: 11/17/2015 Document Reviewed: 12/29/2014 Elsevier Interactive Patient Education  2017 [place] Prevention in the Home Falls can cause injuries. They can happen to people of all ages. There are many things you can do to make your home safe and to help prevent falls. What can I do on the outside of my home? Regularly fix the edges of walkways and driveways and fix any cracks. Remove anything that might make you trip as you walk through a door, such as a raised step or threshold. Trim any bushes or trees on the path to your home. Use bright outdoor lighting. Clear any walking paths of anything that might make someone trip, such as rocks or tools. Regularly check to see if handrails are loose or broken. Make sure that both sides of any steps have handrails. Any raised decks and porches should have guardrails on the edges. Have any leaves, snow, or ice cleared regularly. Use sand or salt on walking paths during winter. Clean up any spills in your garage right away. This includes oil or grease spills. What can I do in the bathroom? Use night lights. Install grab bars by the toilet and in the tub and shower. Do not use towel bars as grab bars. Use non-skid mats or decals in the tub or shower. If you need to sit down in the shower, use a plastic, non-slip stool. Keep the floor dry. Clean up any water that spills on the floor as soon as it happens. Remove soap buildup in the tub or shower regularly. Attach bath mats  securely with double-sided non-slip rug tape. Do not have throw rugs and other things on the floor that can make you trip. What can I do in the bedroom? Use night lights. Make sure that you have a light by your bed that is easy to reach. Do not use any sheets or blankets that are too big for your bed. They should not hang down onto the floor. Have a firm chair that has side arms. You can use this for support while you get dressed. Do not have throw rugs and other things on the floor that can make you trip. What can I do in the kitchen? Clean up any spills right away. Avoid walking on wet floors. Keep items that you use a lot in easy-to-reach places. If you need to reach something above you, use a strong step stool that has a grab bar. Keep electrical cords out of the way. Do not use floor polish or wax that makes floors slippery. If you must use wax, use non-skid floor wax. Do not have throw rugs and other things on the floor that can make you trip. What can I do with my stairs? Do not leave any items on the stairs. Make sure that there are handrails on both sides of the stairs and use them. Fix handrails that are broken or loose. Make sure that handrails are as long as the stairways. Check any carpeting to make sure that it is firmly  attached to the stairs. Fix any carpet that is loose or worn. Avoid having throw rugs at the top or bottom of the stairs. If you do have throw rugs, attach them to the floor with carpet tape. Make sure that you have a light switch at the top of the stairs and the bottom of the stairs. If you do not have them, ask someone to add them for you. What else can I do to help prevent falls? Wear shoes that: Do not have high heels. Have rubber bottoms. Are comfortable and fit you well. Are closed at the toe. Do not wear sandals. If you use a stepladder: Make sure that it is fully opened. Do not climb a closed stepladder. Make sure that both sides of the stepladder  are locked into place. Ask someone to hold it for you, if possible. Clearly mark and make sure that you can see: Any grab bars or handrails. First and last steps. Where the edge of each step is. Use tools that help you move around (mobility aids) if they are needed. These include: Canes. Walkers. Scooters. Crutches. Turn on the lights when you go into a dark area. Replace any light bulbs as soon as they burn out. Set up your furniture so you have a clear path. Avoid moving your furniture around. If any of your floors are uneven, fix them. If there are any pets around you, be aware of where they are. Review your medicines with your doctor. Some medicines can make you feel dizzy. This can increase your chance of falling. Ask your doctor what other things that you can do to help prevent falls. This information is not intended to replace advice given to you by your health care provider. Make sure you discuss any questions you have with your health care provider. Document Released: 12/24/2008 Document Revised: 08/05/2015 Document Reviewed: 04/03/2014 Elsevier Interactive Patient Education  2017 [place].   Pneumococcal Conjugate Vaccine (Prevnar 20) Suspension for Injection What is this medication? PNEUMOCOCCAL VACCINE (NEU mo KOK al vak SEEN) is a vaccine. It prevents pneumococcus bacterial infections. These bacteria can cause serious infections like pneumonia, meningitis, and blood infections. This vaccine will not treat an infection and will not cause infection. This vaccine is recommended for adults 18 years and older. This medicine may be used for other purposes; ask your health care provider or pharmacist if you have questions. COMMON BRAND NAME(S): Prevnar 20 What should I tell my care team before I take this medication? They need to know if you have any of these conditions: bleeding disorder fever immune system problems an unusual or allergic reaction to pneumococcal vaccine,  diphtheria toxoid, other vaccines, other medicines, foods, dyes, or preservatives pregnant or trying to get pregnant breast-feeding How should I use this medication? This vaccine is injected into a muscle. It is given by a health care provider. A copy of Vaccine Information Statements will be given before each vaccination. Be sure to read this information carefully each time. This sheet may change often. Talk to your health care provider about the use of this medicine in children. Special care may be needed. Overdosage: If you think you have taken too much of this medicine contact a poison control center or emergency room at once. NOTE: This medicine is only for you. Do not share this medicine with others. What if I miss a dose? This does not apply. This medicine is not for regular use. What may interact with this medication? medicines for cancer chemotherapy  medicines that suppress your immune function steroid medicines like prednisone or cortisone This list may not describe all possible interactions. Give your health care provider a list of all the medicines, herbs, non-prescription drugs, or dietary supplements you use. Also tell them if you smoke, drink alcohol, or use illegal drugs. Some items may interact with your medicine. What should I watch for while using this medication? Mild fever and pain should go away in 3 days or less. Report any unusual symptoms to your health care provider. What side effects may I notice from receiving this medication? Side effects that you should report to your doctor or health care professional as soon as possible: allergic reactions (skin rash, itching or hives; swelling of the face, lips, or tongue) confusion fast, irregular heartbeat fever over 102 degrees F muscle weakness seizures trouble breathing unusual bruising or bleeding Side effects that usually do not require medical attention (report to your doctor or health care professional if they  continue or are bothersome): fever of 102 degrees F or less headache joint pain muscle cramps, pain pain, tender at site where injected This list may not describe all possible side effects. Call your doctor for medical advice about side effects. You may report side effects to FDA at 1-800-FDA-1088. Where should I keep my medication? This vaccine is only given by a health care provider. It will not be stored at home. NOTE: This sheet is a summary. It may not cover all possible information. If you have questions about this medicine, talk to your doctor, pharmacist, or health care provider.  2022 Elsevier/Gold Standard (2019-11-13 00:00:00)

## 2021-03-10 NOTE — Progress Notes (Signed)
Subjective:   Eric Rose is a 65 y.o. male who presents for Medicare Annual/Subsequent preventive examination.  Review of Systems     Cardiac Risk Factors include: advanced age (>42men, >53 women);dyslipidemia;male gender;hypertension     Objective:    Today's Vitals   03/10/21 1357  BP: 122/84  Pulse: 95  Resp: 16  Temp: 98.1 F (36.7 C)  TempSrc: Oral  SpO2: 97%  Weight: 174 lb 14.4 oz (79.3 kg)  Height: 5\' 11"  (1.803 m)   Body mass index is 24.39 kg/m.  Advanced Directives 03/10/2021 02/26/2020 11/26/2019 11/26/2019 05/06/2018 02/07/2016 08/06/2015  Does Patient Have a Medical Advance Directive? Yes Yes Yes Yes No Yes Yes  Type of Paramedic of [place];Living will Normal;Living will Healthcare Power of [place] will Living will  Does patient want to make changes to medical advance directive? - - No - Patient declined - - - -  Copy of [place] in Chart? No - copy requested No - copy requested No - copy requested No - copy requested - - No - copy requested  Would patient like information on creating a medical advance directive? - - - - Yes (MAU/Ambulatory/Procedural Areas - Information given) - -    Current Medications (verified) Outpatient Encounter Medications as of 03/10/2021  Medication Sig   acetaminophen (TYLENOL) 500 MG tablet Take 500-1,000 mg by mouth daily.   Cyanocobalamin (VITAMIN B-12 PO) Take 1 tablet by mouth daily.   donepezil (ARICEPT) 5 MG tablet Take 5 mg by mouth daily with supper.    lisinopril (ZESTRIL) 20 MG tablet Take 1 tablet (20 mg total) by mouth daily.   memantine (NAMENDA) 5 MG tablet Take 5 mg by mouth 2 (two) times daily.   Multiple Vitamin (MULTIVITAMIN) capsule Take 1 capsule by mouth daily. 50 +   pravastatin (PRAVACHOL) 20 MG tablet Take 1 tablet (20 mg total) by mouth at bedtime.   tadalafil (CIALIS) 10 MG tablet TAKE 1 TABLET  BY MOUTH  30 MINUTES PRIOR TO PLANNED ACTIVITY.   VITAMIN D, ERGOCALCIFEROL, PO Take 1,000 mg by mouth daily.    Vitamin E 400 units TABS Take 400 mg by mouth daily.    No facility-administered encounter medications on file as of 03/10/2021.    Allergies (verified) Patient has no known allergies.   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   Around 2000, damage to left eye (traumatic, playing tennis)   Past Surgical History:  Procedure Laterality Date   JOINT REPLACEMENT Bilateral    hip   ORIF FEMUR FRACTURE Right 11/26/2019   Procedure: OPEN REDUCTION INTERNAL FIXATION (ORIF) proximal FEMUR FRACTURE;  Surgeon: Lovell Sheehan, MD;  Location: [facility] ORS;  Service: Orthopedics;  Laterality: Right;   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   Procedure: TOTAL HIP ARTHROPLASTY;  Surgeon: Earnestine Leys, MD;  Location: [facility] ORS;  Service: Orthopedics;  Laterality: Left;   Family History  Problem Relation Age of Onset   Diabetes Mother    Heart disease Mother    Alzheimer's  disease Mother    Prostate cancer Father    Prostate cancer Brother    Prostate cancer Paternal Uncle    Bladder Cancer Neg Hx    Kidney cancer Neg Hx    Social History   Socioeconomic History   Marital status: Married    Spouse name: [name]   Number of children: 3   Years of education: 12   Highest education level: Some college, no degree  Occupational History   Occupation: retired  Tobacco Use   Smoking status: Never   Smokeless tobacco: Never  Vaping Use   Vaping Use: Never used  Substance and Sexual Activity   Alcohol use: No    Alcohol/week: 0.0 standard drinks   Drug use: No    Sexual activity: Yes  Other Topics Concern   Not on file  Social History Narrative   Retired from [employer] 12/2018   Social Determinants of Health   Financial Resource Strain: Low Risk    Difficulty of Paying Living Expenses: Not hard at all  Food Insecurity: No Food Insecurity   Worried About Charity fundraiser in the Last Year: Never true   Arboriculturist in the Last Year: Never true  Transportation Needs: No Transportation Needs   Lack of Transportation (Medical): No   Lack of Transportation (Non-Medical): No  Physical Activity: Sufficiently Active   Days of Exercise per Week: 7 days   Minutes of Exercise per Session: 30 min  Stress: No Stress Concern Present   Feeling of Stress : Not at all  Social Connections: Moderately Integrated   Frequency of Communication with Friends and Family: More than three times a week   Frequency of Social Gatherings with Friends and Family: Twice a week   Attends Religious Services: More than 4 times per year   Active Member of [employer] or Organizations: No   Attends Music therapist: Never   Marital Status: Married    Tobacco Counseling Counseling given: Not Answered   Clinical Intake:  Pre-visit preparation completed: Yes  Pain : No/denies pain     BMI - recorded: 24.39 Nutritional Status: BMI of 19-24  Normal Nutritional Risks: None Diabetes: No  How often do you need to have someone help you when you read instructions, pamphlets, or other written materials from your doctor or pharmacy?: 1 - Never    Interpreter Needed?: No  Information entered by :: Clemetine Marker LPN   Activities of Daily Living In your present state of health, do you have any difficulty performing the following activities: 03/10/2021 01/06/2021  Hearing? N N  Vision? N N  Difficulty concentrating or making decisions? N N  Walking or climbing stairs? N N  Dressing or bathing? N N  Doing errands, shopping? N N  Preparing Food and eating ? N -   Using the Toilet? N -  In the past six months, have you accidently leaked urine? N -  Do you have problems with loss of bowel control? N -  Managing your Medications? N -  Managing your Finances? N -  Housekeeping or managing your Housekeeping? N -  Some recent data might be hidden    Patient Care Team: Teodora Medici, DO as PCP - General (Internal Medicine) Sunday Corn Mervin Kung (Neurology) Nori Riis PA-C as Physician Assistant (Urology)  Indicate any recent Medical Services you may have received from other than [facility] providers in the past year (date may be approximate).     Assessment:  This is a routine wellness examination for [place].  Hearing/Vision screen Hearing Screening - Comments:: Pt denies hearing difficulty Vision Screening - Comments:: Annual vision screenings done at [facility] issues and exercise activities discussed: Current Exercise Habits: Home exercise routine, Type of exercise: treadmill;strength training/weights, Time (Minutes): 30, Frequency (Times/Week): 7, Weekly Exercise (Minutes/Week): 210, Intensity: Moderate, Exercise limited by: None identified   Goals Addressed             This Visit's Progress    Patient Stated   On track    Patient would like to remain active and healthy       Depression Screen PHQ 2/9 Scores 03/10/2021 01/06/2021 10/27/2020 02/26/2020 10/10/2019 08/13/2019 04/07/2019  PHQ - 2 Score 0 0 0 0 0 0 0  PHQ- 9 Score - 0 - - 1 0 0    Fall Risk Fall Risk  03/10/2021 01/06/2021 10/27/2020 02/26/2020 10/10/2019  Falls in the past year? 0 - 0 1 -  Number falls in past yr: 0 0 0 0 0  Injury with Fall? 0 0 0 1 0  Risk for fall due to : No Fall Risks No Fall Risks - History of fall(s);Impaired balance/gait -  Follow up Falls prevention discussed Falls prevention discussed Falls evaluation completed Falls prevention discussed -    FALL RISK PREVENTION PERTAINING TO THE HOME:  Any stairs in or  around the home? Yes  If so, are there any without handrails? No  Home free of loose throw rugs in walkways, pet beds, electrical cords, etc? Yes  Adequate lighting in your home to reduce risk of falls? Yes   ASSISTIVE DEVICES UTILIZED TO PREVENT FALLS:  Life alert? No  Use of a cane, walker or w/c? No  Grab bars in the bathroom? Yes  Shower chair or bench in shower? No  Elevated toilet seat or a handicapped toilet? Yes   TIMED UP AND GO:  Was the test performed? Yes .  Length of time to ambulate 10 feet: 5 sec.   Gait steady and fast without use of assistive device  Cognitive Function: Normal cognitive status assessed by direct observation by this Nurse Health Advisor. No abnormalities found.          Immunizations Immunization History  Administered Date(s) Administered   Influenza Inj Mdck Quad Pf 12/06/2017   Influenza,inj,Quad PF,6+ Mos 02/18/2016, 12/29/2016, 11/19/2018   PFIZER(Purple Top)SARS-COV-2 Vaccination 05/15/2019, 06/04/2019   Tdap 03/12/2018    TDAP status: Up to date  Flu Vaccine status: Up to date per patient at [facility]  Pneumococcal vaccine status: Due, Education has been provided regarding the importance of this vaccine. Advised may receive this vaccine at local pharmacy or Health Dept. Aware to provide a copy of the vaccination record if obtained from local pharmacy or Health Dept. Verbalized acceptance and understanding.  Covid-19 vaccine status: Completed vaccines  Qualifies for Shingles Vaccine? Yes   Zostavax completed No   Shingrix Completed?: No.    Education has been provided regarding the importance of this vaccine. Patient has been advised to call insurance company to determine out of pocket expense if they have not yet received this vaccine. Advised may also receive vaccine at local pharmacy or Health Dept. Verbalized acceptance and understanding.  Screening Tests Health Maintenance  Topic Date Due   Zoster Vaccines- Shingrix (1 of 2) Never  done   Pneumonia Vaccine 28+ Years old (1 - PCV) Never done   COVID-19 Vaccine (3 - Booster for [employer]  series) 07/30/2019   INFLUENZA VACCINE  10/11/2020   COLONOSCOPY (Pts 45-80yrs Insurance coverage will need to be confirmed)  03/13/2022   TETANUS/TDAP  03/12/2028   Hepatitis C Screening  Completed   HPV VACCINES  Aged Out    Health Maintenance  Health Maintenance Due  Topic Date Due   Zoster Vaccines- Shingrix (1 of 2) Never done   Pneumonia Vaccine 32+ Years old (1 - PCV) Never done   COVID-19 Vaccine (3 - Booster for Pfizer series) 07/30/2019   INFLUENZA VACCINE  10/11/2020    Colorectal cancer screening: Type of screening: Colonoscopy. Completed 2014. Repeat every 10 years  Lung Cancer Screening: (Low Dose CT Chest recommended if Age 66-80 years, 30 pack-year currently smoking OR have quit w/in 15years.) does not qualify.   Additional Screening:  Hepatitis C Screening: does qualify; Completed 02/07/16  Vision Screening: Recommended annual ophthalmology exams for early detection of glaucoma and other disorders of the eye. Is the patient up to date with their annual eye exam?  Yes  Who is the provider or what is the name of the office in which the patient attends annual eye exams? [facility].   Dental Screening: Recommended annual dental exams for proper oral hygiene  Community Resource Referral / Chronic Care Management: CRR required this visit?  No   CCM required this visit?  No      Plan:     I have personally reviewed and noted the following in the patients chart:   Medical and social history Use of alcohol, tobacco or illicit drugs  Current medications and supplements including opioid prescriptions. Patient is not currently taking opioid prescriptions. Functional ability and status Nutritional status Physical activity Advanced directives List of other physicians Hospitalizations, surgeries, and ER visits in previous 12  months Vitals Screenings to include cognitive, depression, and falls Referrals and appointments  In addition, I have reviewed and discussed with patient certain preventive protocols, quality metrics, and best practice recommendations. A written personalized care plan for preventive services as well as general preventive health recommendations were provided to patient.     Clemetine Marker, LPN   [id number]   Nurse Notes: none

## 2021-04-27 ENCOUNTER — Other Ambulatory Visit: Payer: Self-pay

## 2021-04-27 ENCOUNTER — Ambulatory Visit (INDEPENDENT_AMBULATORY_CARE_PROVIDER_SITE_OTHER): Payer: PPO | Admitting: Nurse Practitioner

## 2021-04-27 ENCOUNTER — Encounter: Payer: Self-pay | Admitting: Nurse Practitioner

## 2021-04-27 VITALS — BP 138/80 | HR 93 | Temp 97.7°F | Resp 18 | Ht 71.0 in | Wt 173.8 lb

## 2021-04-27 DIAGNOSIS — E782 Mixed hyperlipidemia: Secondary | ICD-10-CM | POA: Diagnosis not present

## 2021-04-27 DIAGNOSIS — R413 Other amnesia: Secondary | ICD-10-CM

## 2021-04-27 DIAGNOSIS — N138 Other obstructive and reflux uropathy: Secondary | ICD-10-CM

## 2021-04-27 DIAGNOSIS — N401 Enlarged prostate with lower urinary tract symptoms: Secondary | ICD-10-CM | POA: Diagnosis not present

## 2021-04-27 DIAGNOSIS — G4733 Obstructive sleep apnea (adult) (pediatric): Secondary | ICD-10-CM | POA: Diagnosis not present

## 2021-04-27 DIAGNOSIS — I10 Essential (primary) hypertension: Secondary | ICD-10-CM | POA: Diagnosis not present

## 2021-04-27 DIAGNOSIS — N529 Male erectile dysfunction, unspecified: Secondary | ICD-10-CM

## 2021-04-27 LAB — CBC WITH DIFFERENTIAL/PLATELET
Absolute Monocytes: 601 cells/uL (ref 200–950)
Basophils Absolute: 19 cells/uL (ref 0–200)
Basophils Relative: 0.3 %
Eosinophils Absolute: 391 cells/uL (ref 15–500)
Eosinophils Relative: 6.3 %
HCT: 42.8 % (ref 38.5–50.0)
Hemoglobin: 14.5 g/dL (ref 13.2–17.1)
Lymphs Abs: 1345 cells/uL (ref 850–3900)
MCH: 31.9 pg (ref 27.0–33.0)
MCHC: 33.9 g/dL (ref 32.0–36.0)
MCV: 94.3 fL (ref 80.0–100.0)
MPV: 12 fL (ref 7.5–12.5)
Monocytes Relative: 9.7 %
Neutro Abs: 3844 cells/uL (ref 1500–7800)
Neutrophils Relative %: 62 %
Platelets: 192 10*3/uL (ref 140–400)
RBC: 4.54 10*6/uL (ref 4.20–5.80)
RDW: 11.9 % (ref 11.0–15.0)
Total Lymphocyte: 21.7 %
WBC: 6.2 10*3/uL (ref 3.8–10.8)

## 2021-04-27 LAB — COMPLETE METABOLIC PANEL WITH GFR
AG Ratio: 1.8 (calc) (ref 1.0–2.5)
ALT: 29 U/L (ref 9–46)
AST: 28 U/L (ref 10–35)
Albumin: 4.1 g/dL (ref 3.6–5.1)
Alkaline phosphatase (APISO): 75 U/L (ref 35–144)
BUN: 13 mg/dL (ref 7–25)
CO2: 29 mmol/L (ref 20–32)
Calcium: 9 mg/dL (ref 8.6–10.3)
Chloride: 105 mmol/L (ref 98–110)
Creat: 0.93 mg/dL (ref 0.70–1.35)
Globulin: 2.3 g/dL (calc) (ref 1.9–3.7)
Glucose, Bld: 85 mg/dL (ref 65–99)
Potassium: 4.3 mmol/L (ref 3.5–5.3)
Sodium: 139 mmol/L (ref 135–146)
Total Bilirubin: 0.6 mg/dL (ref 0.2–1.2)
Total Protein: 6.4 g/dL (ref 6.1–8.1)
eGFR: 90 mL/min/{1.73_m2} (ref >=60)

## 2021-04-27 LAB — LIPID PANEL
Cholesterol: 143 mg/dL (ref <200)
HDL: 54 mg/dL (ref >=40)
LDL Cholesterol (Calc): 76 mg/dL (calc)
Non-HDL Cholesterol (Calc): 89 mg/dL (calc) (ref <130)
Total CHOL/HDL Ratio: 2.6 (calc) (ref <5.0)
Triglycerides: 45 mg/dL (ref <150)

## 2021-04-27 MED ORDER — PRAVASTATIN SODIUM 20 MG PO TABS: 20.0000 mg | ORAL_TABLET | Freq: Every day | ORAL | 3 refills | Status: DC

## 2021-04-27 MED ORDER — LISINOPRIL 20 MG PO TABS: 20.0000 mg | ORAL_TABLET | Freq: Every day | ORAL | 3 refills | Status: DC

## 2021-04-27 NOTE — Progress Notes (Signed)
BP 138/80    Pulse 93    Temp 97.7 F (36.5 C) (Oral)    Resp 18    Ht 5\' 11"  (1.803 m)    Wt 173 lb 12.8 oz (78.8 kg)    SpO2 97%    BMI 24.24 kg/m    Subjective:    Patient ID: Eric Rose, male    DOB: [birth year], 66 y.o.   MRN: [id number]  HPI: Eric Rose is a 66 y.o. male, here alone  Chief Complaint  Patient presents with   Hypertension   Hyperlipidemia    6 month follow up   Hypertension: He checks his blood pressure regularly.  He says his blood pressure has been running around 120s/80s, he says occasionally they are higher.  He says he takes his blood pressure medication every day.  He is currently taking lisinopril 20 mg daily.  He denies any chest pain, shortness of breath, headaches or blurred vision.   Hyperlipidemia: Last LDL 78 on 10/01/2020, will get labs today. He says he does take his pravastatin 20 mg daily.  He denies any myalgia.   OSA on CPAP:Starts off wearing it every night but does not wear it all night long.   BPH: He is currently followed by [name], last seen on 03/03/2021. They are treating it with conservative management, avoiding bladder irritants and timed voiding. Patient says he is doing well.   ED: He says that he uses his cialis when needed but that he does not as sexually active as he was.   Memory impairment:  He is getting care from [name] neurology for memory impairment. He is currently taking aricept and namenda.  Patient says he is doing well.   Relevant past medical, surgical, family and social history reviewed and updated as indicated. Interim medical history since our last visit reviewed. Allergies and medications reviewed and updated.  Review of Systems  Constitutional: Negative for fever or weight change.  Respiratory: Negative for cough and shortness of breath.   Cardiovascular: Negative for chest pain or palpitations.  Gastrointestinal: Negative for abdominal pain, no bowel changes.  Musculoskeletal: Negative  for gait problem or joint swelling.  Skin: Negative for rash.  Neurological: Negative for dizziness or headache.  No other specific complaints in a complete review of systems (except as listed in HPI above).      Objective:    BP 138/80    Pulse 93    Temp 97.7 F (36.5 C) (Oral)    Resp 18    Ht 5\' 11"  (1.803 m)    Wt 173 lb 12.8 oz (78.8 kg)    SpO2 97%    BMI 24.24 kg/m   Wt Readings from Last 3 Encounters:  04/27/21 173 lb 12.8 oz (78.8 kg)  03/10/21 174 lb 14.4 oz (79.3 kg)  01/06/21 173 lb (78.5 kg)    Physical Exam  Constitutional: Patient appears well-developed and well-nourished.  No distress.  HEENT: head atraumatic, normocephalic, pupils equal and reactive to light,  neck supple Cardiovascular: Normal rate, regular rhythm and normal heart sounds.  No murmur heard. No BLE edema. Pulmonary/Chest: Effort normal and breath sounds normal. No respiratory distress. Abdominal: Soft.  There is no tenderness. Psychiatric: Patient has a normal mood and affect. behavior is normal. Judgment and thought content normal.   Results for orders placed or performed in visit on 03/03/21  Bladder Scan (Post Void Residual) in office  Result Value Ref Range   Scan Result  4       Assessment & Plan:   1. Essential hypertension, benign  - CBC with Differential/Platelet - COMPLETE METABOLIC PANEL WITH GFR - lisinopril (ZESTRIL) 20 MG tablet; Take 1 tablet (20 mg total) by mouth daily.  Dispense: 90 tablet; Refill: 3  2. Mixed hyperlipidemia  - Lipid panel - COMPLETE METABOLIC PANEL WITH GFR - pravastatin (PRAVACHOL) 20 MG tablet; Take 1 tablet (20 mg total) by mouth at bedtime.  Dispense: 90 tablet; Refill: 3  3. Obstructive sleep apnea -try wearing your cpap consistently  4. BPH with obstruction/lower urinary tract symptoms -continue with current treatment plan  5. Erectile dysfunction, unspecified erectile dysfunction type -continue with current treatment plan  6. Memory  impairment -continue with current treatment plan  Follow up plan: Return in about 6 months (around 10/25/2021) for follow up.

## 2021-04-29 ENCOUNTER — Ambulatory Visit: Payer: PPO | Admitting: Family Medicine

## 2021-06-30 ENCOUNTER — Other Ambulatory Visit: Payer: Self-pay | Admitting: Nurse Practitioner

## 2021-06-30 NOTE — Telephone Encounter (Signed)
Medication Refill - Medication:  ?memantine (NAMENDA) 5 MG tablet  ? ?Has the patient contacted their pharmacy? Yes.   ?Contact PCP ? ?Preferred Pharmacy (with phone number or street name):  ?[place], [place] - [address]  ?[address] [place] [address]  ?Phone:  [phone]  Fax:  [phone]  ? ?Has the patient been seen for an appointment in the last year OR does the patient have an upcoming appointment? Yes.   ? ?Agent: Please be advised that RX refills may take up to 3 business days. We ask that you follow-up with your pharmacy. ?

## 2021-07-01 ENCOUNTER — Other Ambulatory Visit: Payer: Self-pay

## 2021-07-01 MED ORDER — MEMANTINE HCL 5 MG PO TABS: 5.0000 mg | ORAL_TABLET | Freq: Two times a day (BID) | ORAL | 1 refills | Status: DC

## 2021-07-01 NOTE — Telephone Encounter (Signed)
Requested medication (s) are due for refill today: yes ? ?Requested medication (s) are on the active medication list: historical med  ? ?Last refill:  04/07/19 ? ?Future visit scheduled: yes ? ?Notes to clinic: historical med ? ? ?Requested Prescriptions  ?Pending Prescriptions Disp Refills  ? memantine (NAMENDA) 5 MG tablet    ?  Sig: Take 1 tablet (5 mg total) by mouth 2 (two) times daily.  ?  ? Neurology:  Alzheimer's Agents 2 Passed - 06/30/2021  3:37 PM  ?  ?  Passed - Cr in normal range and within 360 days  ?  Creat  ?Date Value Ref Range Status  ?04/27/2021 0.93 0.70 - 1.35 mg/dL Final  ?  ?  ?  ?  Passed - eGFR is 5 or above and within 360 days  ?  GFR, Est African American  ?Date Value Ref Range Status  ?04/07/2019 104 > OR = 60 mL/min/1.73m2 Final  ? ?GFR calc Af Amer  ?Date Value Ref Range Status  ?11/26/2019 >60 >60 mL/min Final  ? ?GFR, Est Non African American  ?Date Value Ref Range Status  ?04/07/2019 89 > OR = 60 mL/min/1.73m2 Final  ? ?GFR calc non Af Amer  ?Date Value Ref Range Status  ?11/26/2019 >60 >60 mL/min Final  ? ?eGFR  ?Date Value Ref Range Status  ?04/27/2021 90 > OR = 60 mL/min/1.73m2 Final  ?  Comment:  ?  The eGFR is based on the CKD-EPI 2021 equation. To calculate  ?the new eGFR from a previous Creatinine or Cystatin C ?result, go to https://www.kidney.org/professionals/ ?kdoqi/gfr%5Fcalculator ?  ?  ?  ?  ?  Passed - Valid encounter within last 6 months  ?  Recent Outpatient Visits   ? ?      ? 2 months ago Essential hypertension, benign  ? [facility] Pender, Julie F, FNP  ? 5 months ago Clavicle enlargement  ? [facility] Wicker, Cheryl, NP  ? 8 months ago Obstructive sleep apnea  ? [facility] Rumball, Alison M, DO  ? 1 year ago Hip pain, right  ? [facility] Hendrickson, Clifford D, MD  ? 1 year ago Erectile dysfunction, unspecified erectile dysfunction type  ? [facility]  Hendrickson, Clifford D, MD  ? ?  ?  ?Future Appointments   ? ?        ? In 3 months Pender, Julie F, FNP [facility]  ? In 8 months McGowan, Shannon A, PA-C [facility]  ? In 8 months  [facility]  ? ?  ? ? ?  ?  ?  ? ? ? ? ?

## 2021-07-29 ENCOUNTER — Encounter: Payer: Self-pay | Admitting: Podiatry

## 2021-08-01 ENCOUNTER — Other Ambulatory Visit: Payer: Self-pay | Admitting: Nurse Practitioner

## 2021-08-02 NOTE — Telephone Encounter (Signed)
Requested medication (s) are due for refill today: yes  Requested medication (s) are on the active medication list: yes  Last refill:  07/01/21 #30 0 refills  Future visit scheduled: yes in 2 months  Notes to clinic:  do you want to continue refills Rx #60 ?     Requested Prescriptions  Pending Prescriptions Disp Refills   memantine (NAMENDA) 5 MG tablet [Pharmacy Med Name: Memantine HCl 5 MG Oral Tablet] 30 tablet 0    Sig: Take 1 tablet by mouth twice daily     Neurology:  Alzheimer's Agents 2 Passed - 08/01/2021  8:28 AM      Passed - Cr in normal range and within 360 days    Creat  Date Value Ref Range Status  04/27/2021 0.93 0.70 - 1.35 mg/dL Final         Passed - eGFR is 5 or above and within 360 days    GFR, Est African American  Date Value Ref Range Status  04/07/2019 104 > OR = 60 mL/min/1.73m2 Final   GFR calc Af Amer  Date Value Ref Range Status  11/26/2019 >60 >60 mL/min Final   GFR, Est Non African American  Date Value Ref Range Status  04/07/2019 89 > OR = 60 mL/min/1.73m2 Final   GFR calc non Af Amer  Date Value Ref Range Status  11/26/2019 >60 >60 mL/min Final   eGFR  Date Value Ref Range Status  04/27/2021 90 > OR = 60 mL/min/1.73m2 Final    Comment:    The eGFR is based on the CKD-EPI 2021 equation. To calculate  the new eGFR from a previous Creatinine or Cystatin C result, go to https://www.kidney.org/professionals/ kdoqi/gfr%5Fcalculator          Passed - Valid encounter within last 6 months    Recent Outpatient Visits           3 months ago Essential hypertension, benign   [facility] Pender, Julie F, FNP   6 months ago Clavicle enlargement   [facility] Wicker, Cheryl, NP   9 months ago Obstructive sleep apnea   [facility] Rumball, Alison M, DO   1 year ago Hip pain, right   [facility] Hendrickson, Clifford D, MD   1 year ago Erectile dysfunction,  unspecified erectile dysfunction type   [facility] Hendrickson, Clifford D, MD       Future Appointments             In 2 months Pender, Julie F, FNP [facility]   In 7 months McGowan, Shannon A, PA-C [facility]   In 7 months  [facility]              

## 2021-08-03 ENCOUNTER — Other Ambulatory Visit: Payer: Self-pay | Admitting: Emergency Medicine

## 2021-08-03 MED ORDER — MEMANTINE HCL 5 MG PO TABS: 5.0000 mg | ORAL_TABLET | Freq: Two times a day (BID) | ORAL | 1 refills | Status: DC

## 2021-08-10 ENCOUNTER — Ambulatory Visit: Payer: PPO | Admitting: Podiatry

## 2021-08-17 ENCOUNTER — Encounter: Payer: Self-pay | Admitting: Podiatry

## 2021-08-17 ENCOUNTER — Ambulatory Visit: Payer: PPO | Admitting: Podiatry

## 2021-08-17 DIAGNOSIS — D2371 Other benign neoplasm of skin of right lower limb, including hip: Secondary | ICD-10-CM

## 2021-08-17 DIAGNOSIS — D2372 Other benign neoplasm of skin of left lower limb, including hip: Secondary | ICD-10-CM | POA: Diagnosis not present

## 2021-08-17 NOTE — Progress Notes (Signed)
He presents today chief complaint of painful lesions plantar aspect of the bilateral foot.  Objective: Vital signs stable he is alert oriented x3 pulses are palpable..  He has solitary benign skin lesions plantar heel right and plantar subfifth metatarsal head right.  He also has left plantar lesion just proximal to the fifth metatarsal head left.  This is by far the largest lesion measuring about a centimeter in diameter.  There are no open wounds.  These lesions are exquisitely painful on direct palpation.  Assessment: Benign skin lesions x3 2 on the right 1 on the left.  Plan: Debridement of these benign skin lesions today with chemical destruction of the lesion with Salinocaine placed under occlusion for 3 days then washed off thoroughly.

## 2021-08-28 IMAGING — RF DG C-ARM 1-60 MIN
1 series · 7 of 7 positions shown · non-contrast
Comparison: None.

CLINICAL DATA: ORIF right femur fracture

EXAM:
RIGHT FEMUR 2 VIEWS; DG C-ARM 1-60 MIN

[Series 1: dg x-ray · 0.14mm/px · 7 of 7 slices shown]
[im 1/7]
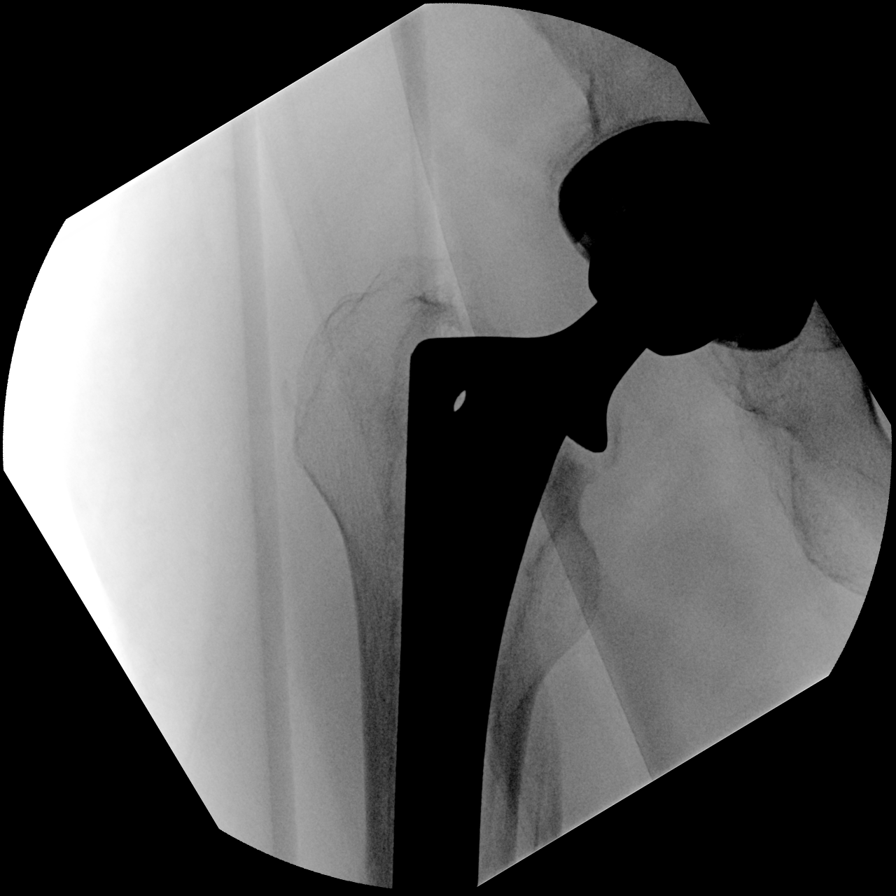
[im 2/7]
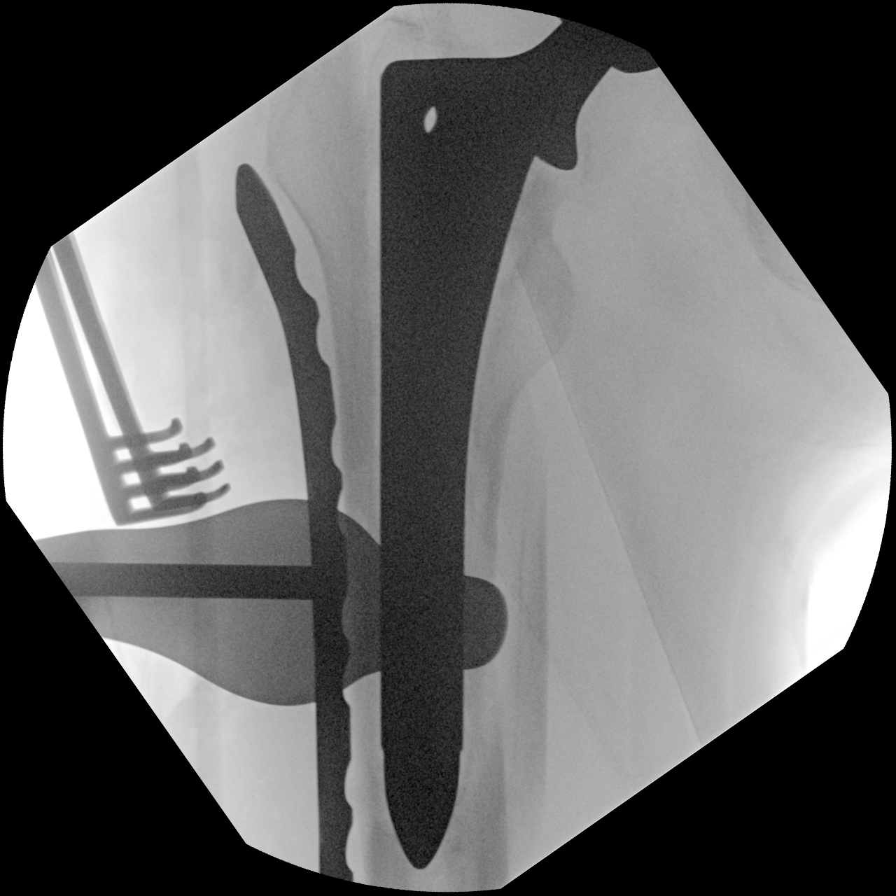
[im 3/7]
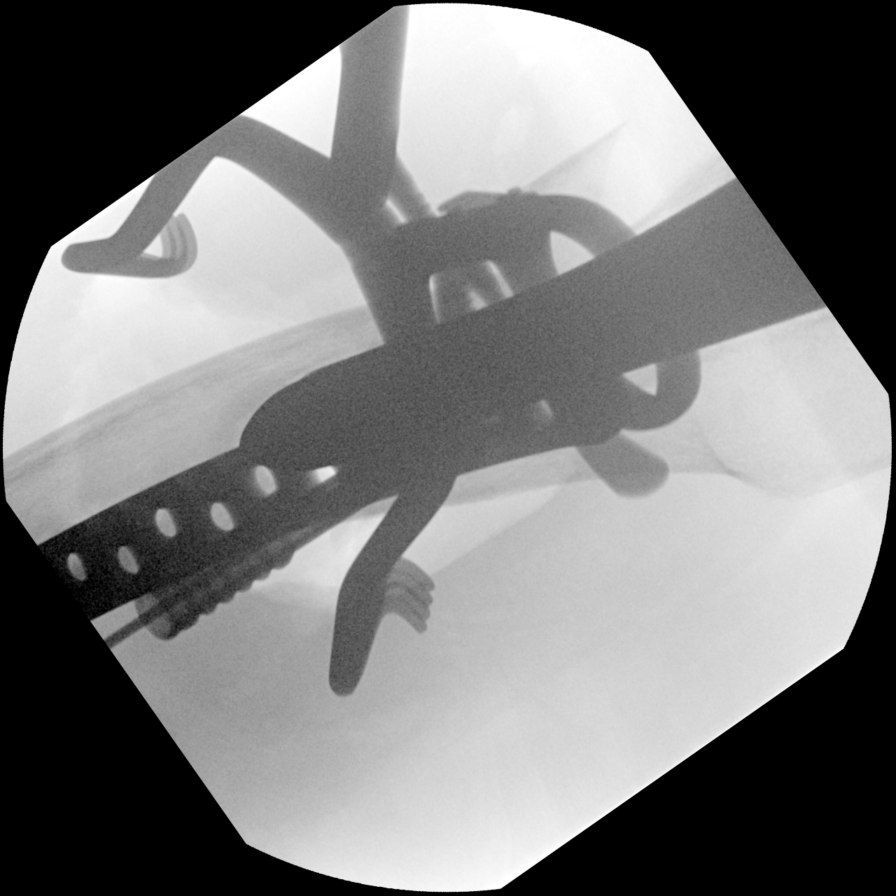
[im 4/7]
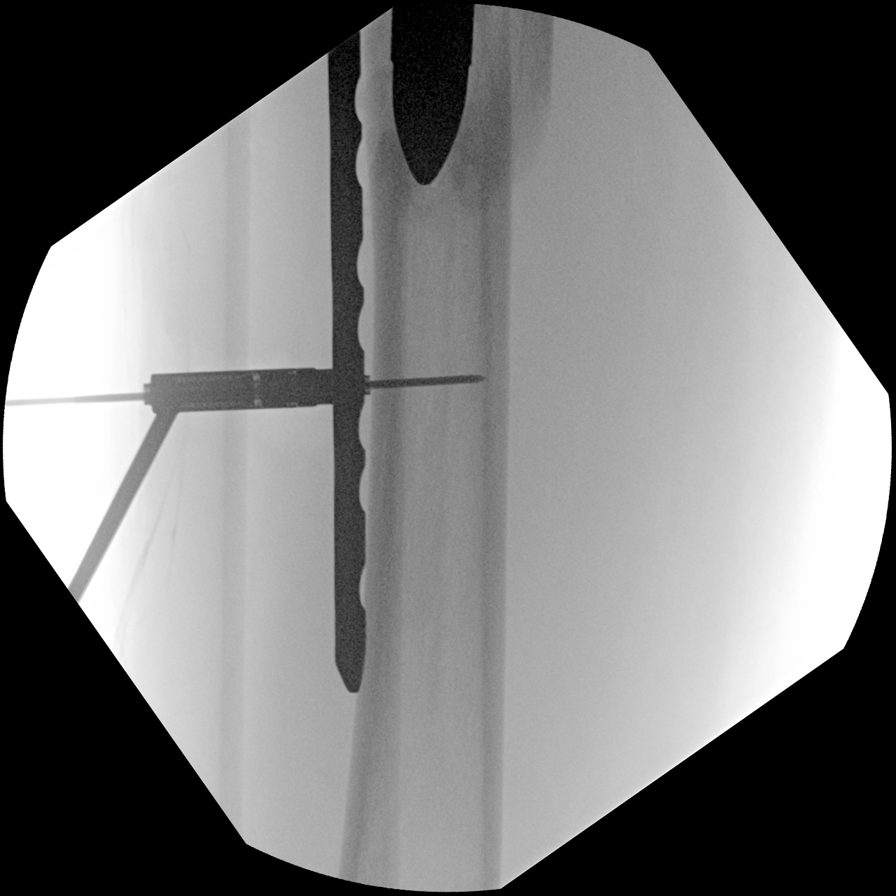
[im 5/7]
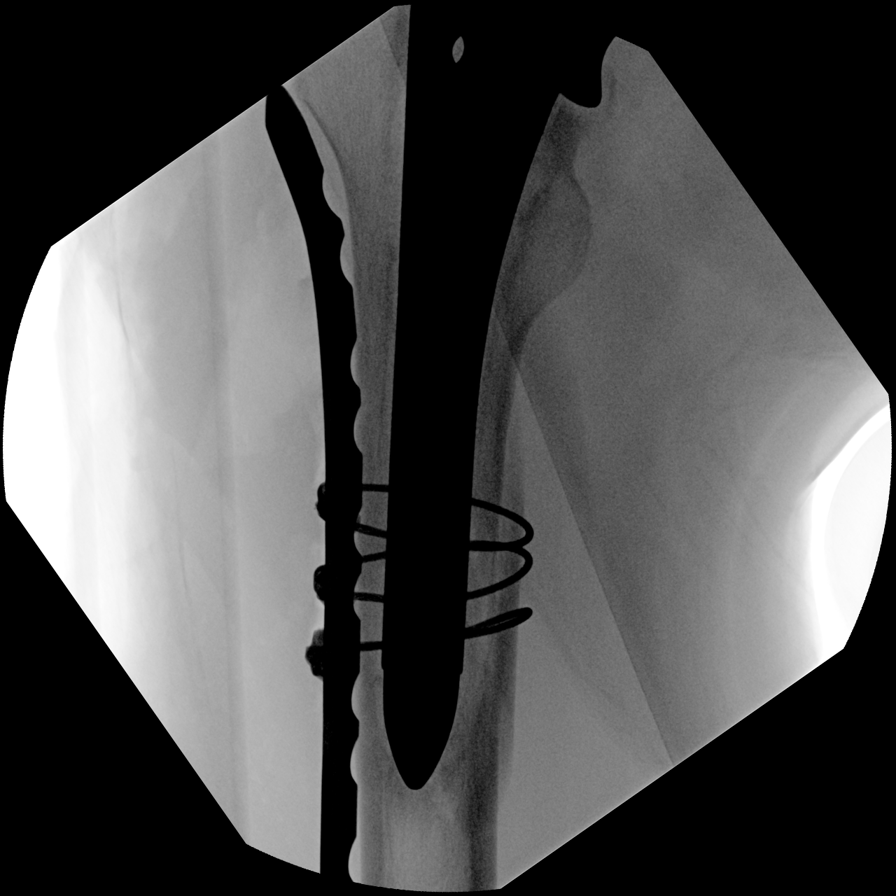
[im 6/7]
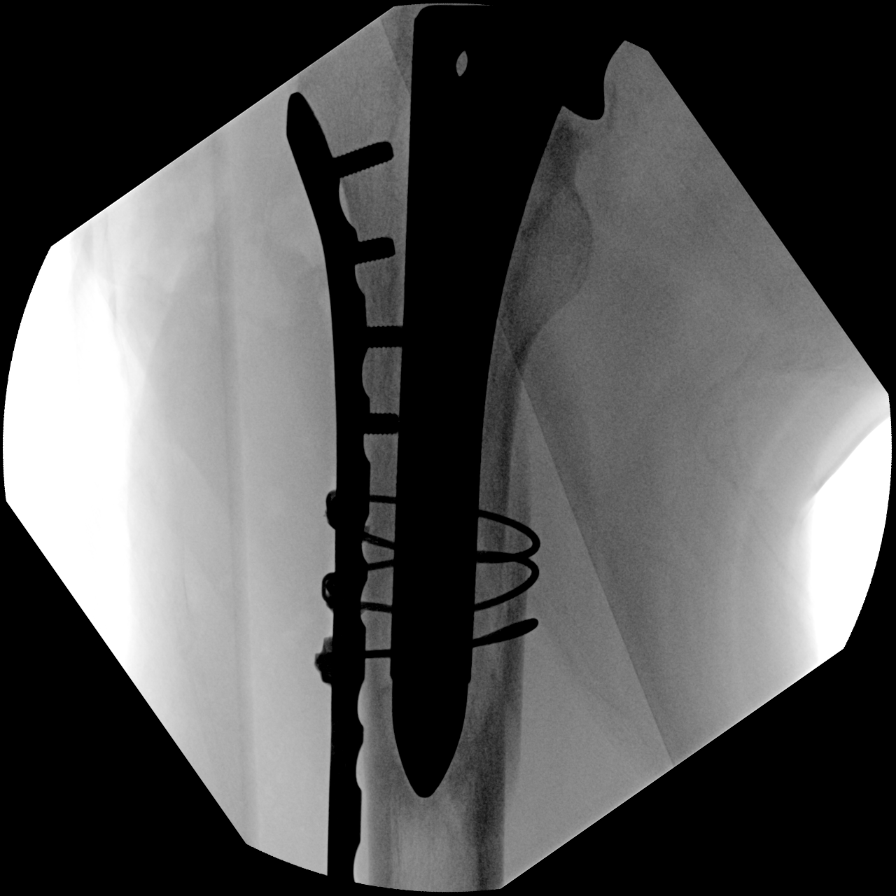
[im 7/7]
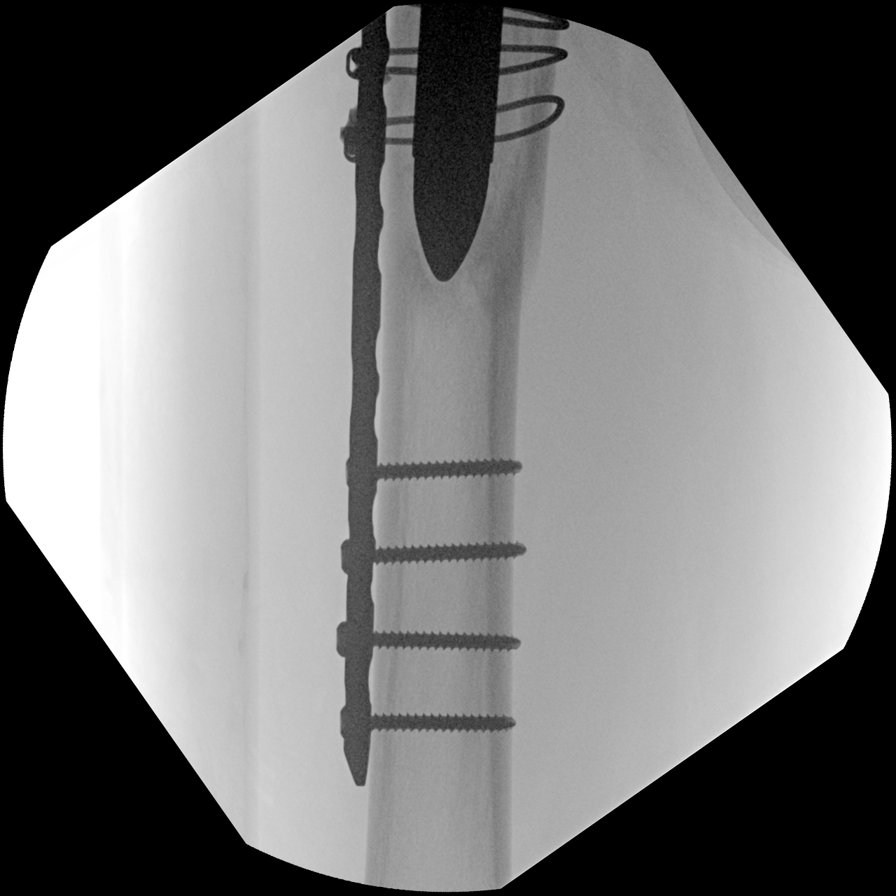

[7 of 7 positions shown; findings below may reference images not displayed]

FINDINGS: Seven low resolution intraoperative spot views of the right femur.
Total fluoroscopy time was 48 seconds. The images demonstrate a
right hip replacement with normal alignment. Subsequent placement of
lateral fixating plate, multiple distal fixating screws, and
placement of cerclage wires about the proximal shaft of the femur
for periprosthetic fracture.
IMPRESSION: Intraoperative fluoroscopic assistance provided during right femur
fracture fixation.

## 2021-08-28 IMAGING — RF DG C-ARM 1-60 MIN
1 series · 7 of 7 positions shown · non-contrast
Comparison: None.

CLINICAL DATA: ORIF right femur fracture

EXAM:
RIGHT FEMUR 2 VIEWS; DG C-ARM 1-60 MIN

[Series 1: dg x-ray · 0.14mm/px · 7 of 7 slices shown]
[im 1/7]
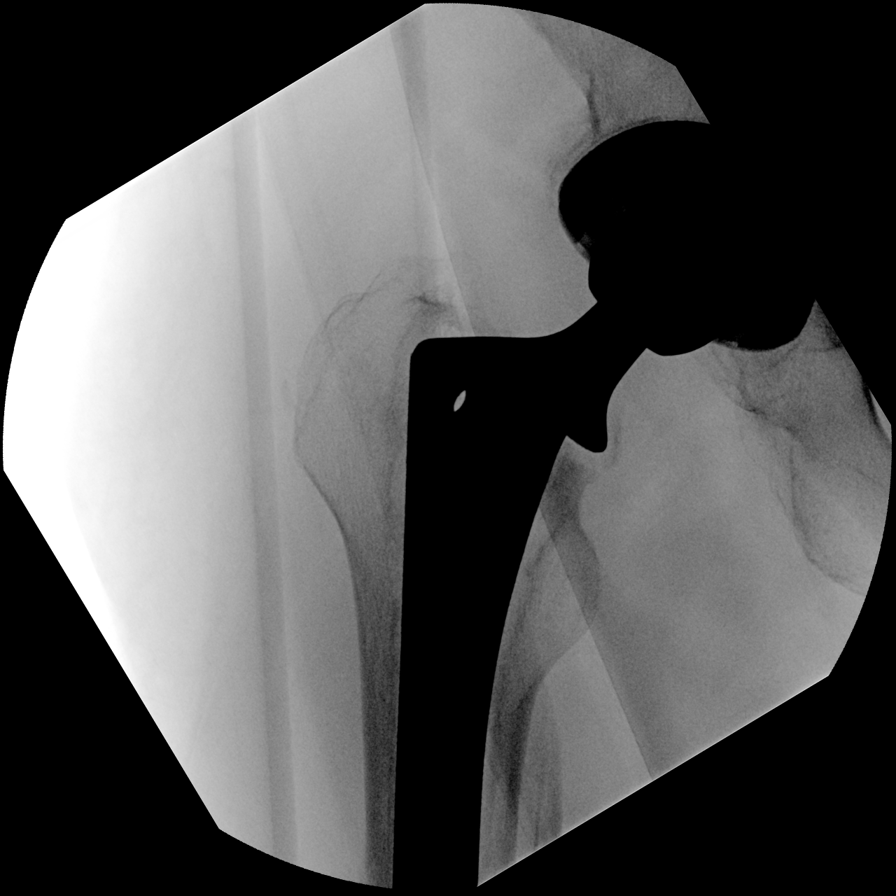
[im 2/7]
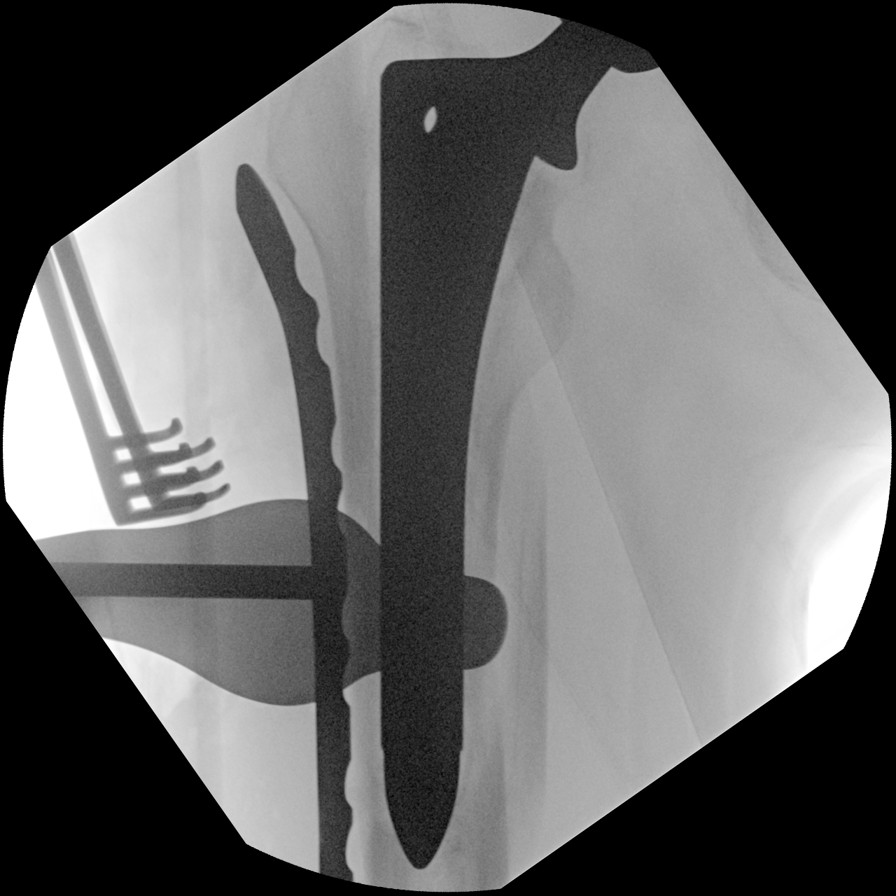
[im 3/7]
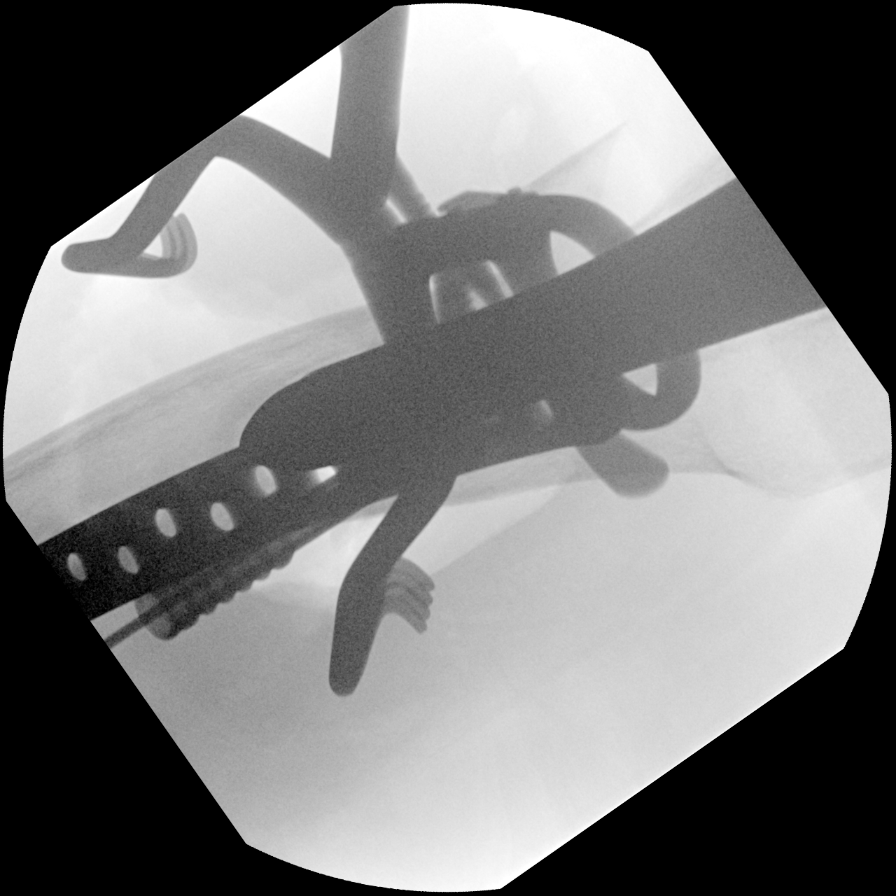
[im 4/7]
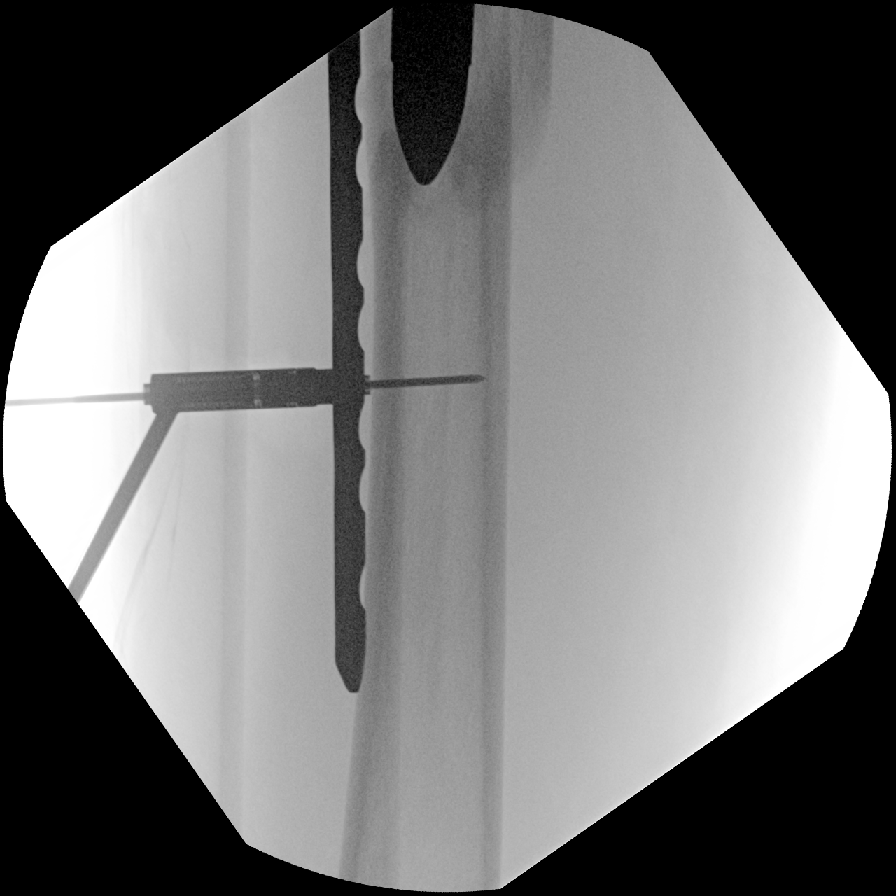
[im 5/7]
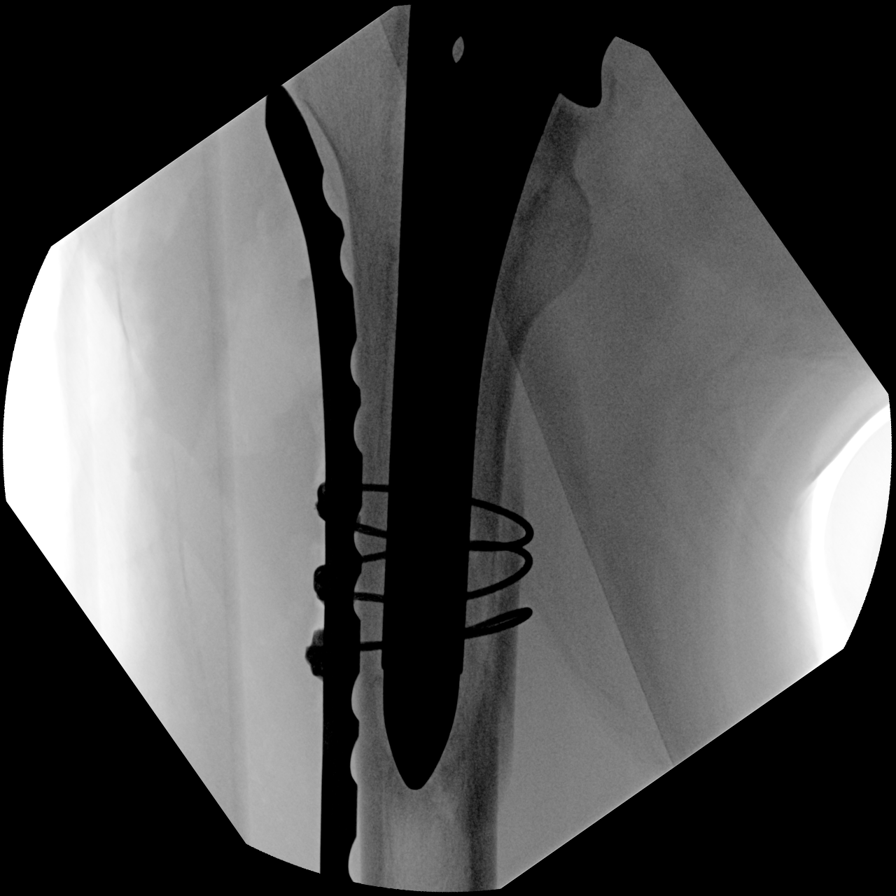
[im 6/7]
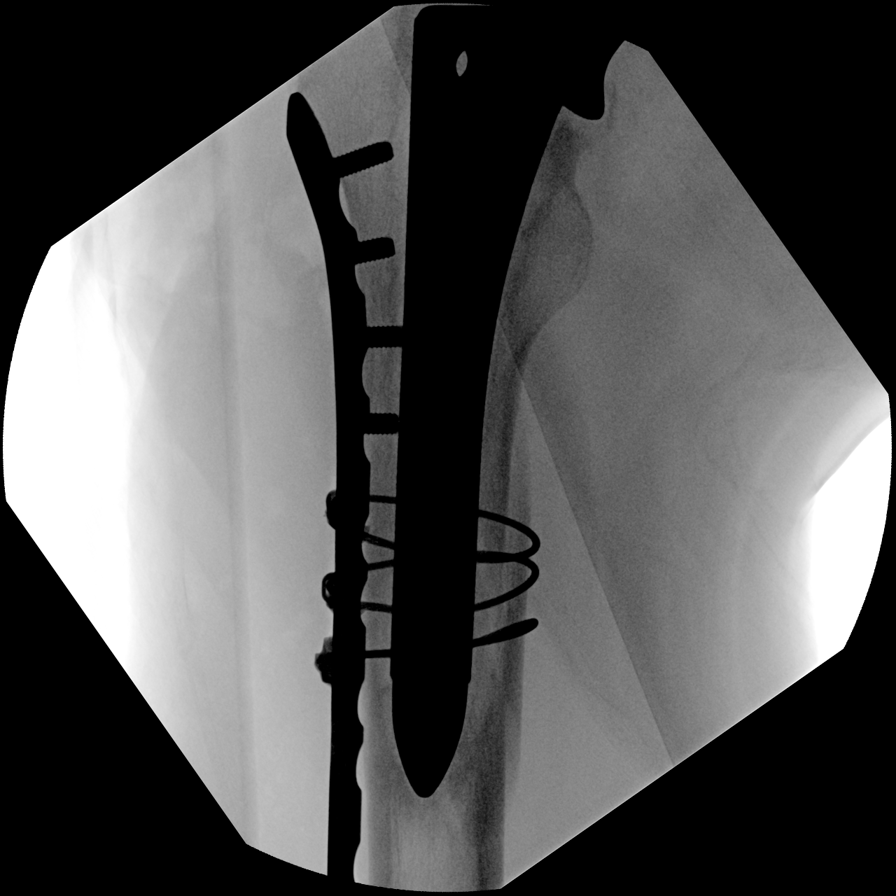
[im 7/7]
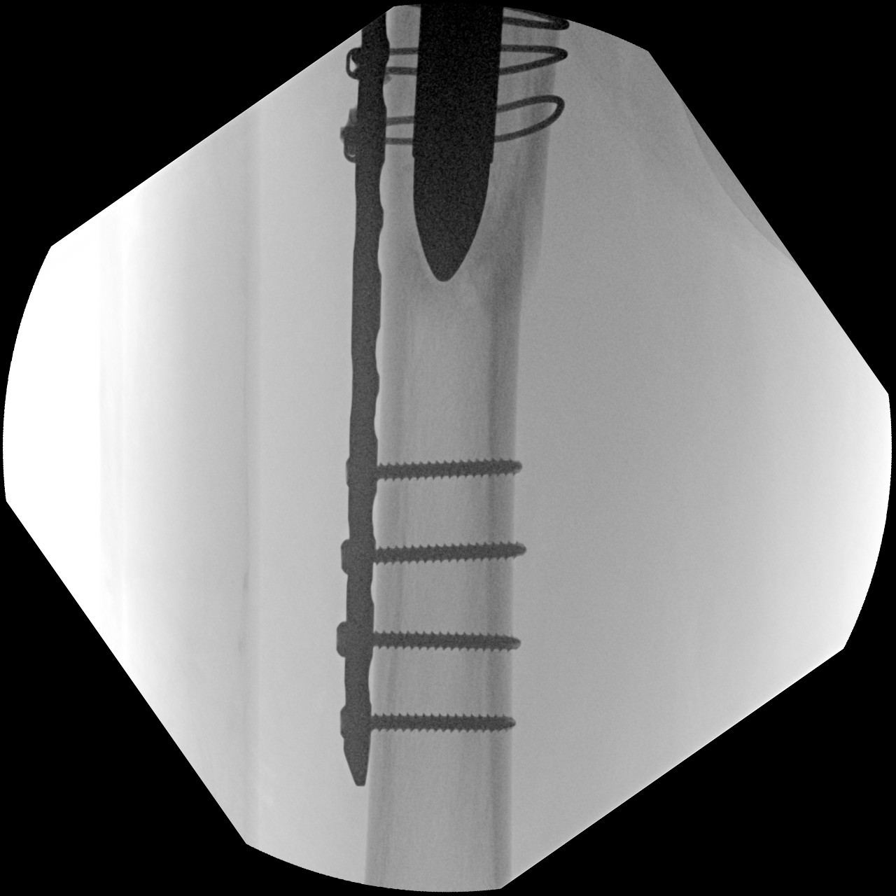

[7 of 7 positions shown; findings below may reference images not displayed]

FINDINGS: Seven low resolution intraoperative spot views of the right femur.
Total fluoroscopy time was 48 seconds. The images demonstrate a
right hip replacement with normal alignment. Subsequent placement of
lateral fixating plate, multiple distal fixating screws, and
placement of cerclage wires about the proximal shaft of the femur
for periprosthetic fracture.
IMPRESSION: Intraoperative fluoroscopic assistance provided during right femur
fracture fixation.

## 2021-09-01 ENCOUNTER — Other Ambulatory Visit: Payer: Self-pay | Admitting: Nurse Practitioner

## 2021-09-01 NOTE — Telephone Encounter (Signed)
Requested medications are due for refill today.  Unsure  Requested medications are on the active medications list.  yes  Last refill. 08/04/2019  Future visit scheduled.   yes  Notes to clinic.  Medication is listed as historical. Please review for refill.    Requested Prescriptions  Pending Prescriptions Disp Refills   donepezil (ARICEPT) 5 MG tablet      Sig: Take 1 tablet (5 mg total) by mouth daily with supper.     Neurology:  Alzheimer's Agents Passed - 09/01/2021 11:30 AM      Passed - Valid encounter within last 6 months    Recent Outpatient Visits           4 months ago Essential hypertension, benign   [facility] Bo Merino, FNP   7 months ago Clavicle enlargement   [facility] Kathrine Haddock, NP   10 months ago Obstructive sleep apnea   [place], DO   1 year ago Hip pain, right   [place], MD   2 years ago Erectile dysfunction, unspecified erectile dysfunction type   [facility] Towanda Malkin, MD       Future Appointments             In 1 month Reece Packer, Myna Hidalgo, [facility] [place]   In 6 months McGowan, Gordan Payment [place]   In 6 months  [facility], [facility]

## 2021-09-01 NOTE — Telephone Encounter (Signed)
Medication Refill - Medication: donepezil (ARICEPT) 5 MG tablet  Has the patient contacted their pharmacy? Yes.   (Agent: If no, request that the patient contact the pharmacy for the refill. If patient does not wish to contact the pharmacy document the reason why and proceed with request.) (Agent: If yes, when and what did the pharmacy advise?)  Preferred Pharmacy (with phone number or street name):  [place], [place] Phone:  [phone]  Fax:  [phone]     Has the patient been seen for an appointment in the last year OR does the patient have an upcoming appointment? Yes.    Agent: Please be advised that RX refills may take up to 3 business days. We ask that you follow-up with your pharmacy.  Patient states the patient has been out of this medication for a few days

## 2021-09-05 ENCOUNTER — Other Ambulatory Visit: Payer: Self-pay

## 2021-09-05 MED ORDER — DONEPEZIL HCL 5 MG PO TABS: 5.0000 mg | ORAL_TABLET | Freq: Every day | ORAL | 3 refills | Status: DC

## 2021-09-06 ENCOUNTER — Other Ambulatory Visit: Payer: Self-pay | Admitting: Nurse Practitioner

## 2021-10-24 ENCOUNTER — Other Ambulatory Visit: Payer: Self-pay

## 2021-10-24 ENCOUNTER — Ambulatory Visit (INDEPENDENT_AMBULATORY_CARE_PROVIDER_SITE_OTHER): Payer: PPO | Admitting: Nurse Practitioner

## 2021-10-24 ENCOUNTER — Ambulatory Visit
Admission: RE | Admit: 2021-10-24 | Discharge: 2021-10-24 | Disposition: A | Discharge status: Home / Self Care | Payer: PPO | Attending: Nurse Practitioner | Admitting: Nurse Practitioner

## 2021-10-24 ENCOUNTER — Encounter: Payer: Self-pay | Admitting: Nurse Practitioner

## 2021-10-24 ENCOUNTER — Ambulatory Visit
Admission: RE | Admit: 2021-10-24 | Discharge: 2021-10-24 | Disposition: A | Discharge status: Home / Self Care | Payer: PPO | Source: Ambulatory Visit | Attending: Nurse Practitioner | Admitting: Nurse Practitioner

## 2021-10-24 VITALS — BP 118/76 | HR 78 | Temp 98.0°F | Resp 18 | Ht 71.0 in | Wt 165.4 lb

## 2021-10-24 DIAGNOSIS — M4856XA Collapsed vertebra, not elsewhere classified, lumbar region, initial encounter for fracture: Secondary | ICD-10-CM | POA: Diagnosis not present

## 2021-10-24 DIAGNOSIS — R634 Abnormal weight loss: Secondary | ICD-10-CM | POA: Diagnosis not present

## 2021-10-24 DIAGNOSIS — N401 Enlarged prostate with lower urinary tract symptoms: Secondary | ICD-10-CM

## 2021-10-24 DIAGNOSIS — Z125 Encounter for screening for malignant neoplasm of prostate: Secondary | ICD-10-CM

## 2021-10-24 DIAGNOSIS — E782 Mixed hyperlipidemia: Secondary | ICD-10-CM

## 2021-10-24 DIAGNOSIS — I10 Essential (primary) hypertension: Secondary | ICD-10-CM

## 2021-10-24 DIAGNOSIS — M47894 Other spondylosis, thoracic region: Secondary | ICD-10-CM | POA: Diagnosis not present

## 2021-10-24 DIAGNOSIS — G4733 Obstructive sleep apnea (adult) (pediatric): Secondary | ICD-10-CM

## 2021-10-24 DIAGNOSIS — M4854XA Collapsed vertebra, not elsewhere classified, thoracic region, initial encounter for fracture: Secondary | ICD-10-CM | POA: Diagnosis not present

## 2021-10-24 DIAGNOSIS — M4185 Other forms of scoliosis, thoracolumbar region: Secondary | ICD-10-CM | POA: Diagnosis not present

## 2021-10-24 DIAGNOSIS — R413 Other amnesia: Secondary | ICD-10-CM

## 2021-10-24 DIAGNOSIS — N529 Male erectile dysfunction, unspecified: Secondary | ICD-10-CM

## 2021-10-24 DIAGNOSIS — N138 Other obstructive and reflux uropathy: Secondary | ICD-10-CM

## 2021-10-24 NOTE — Assessment & Plan Note (Signed)
Continue taking pravastatin 20 mg daily.   

## 2021-10-24 NOTE — Assessment & Plan Note (Signed)
Has been avoiding bladder irritants and using timed voiding.  Followed by Dr. Anitra Lauth

## 2021-10-24 NOTE — Assessment & Plan Note (Signed)
Not been seen by neurology in years.  Recommend calling office to schedule follow-up appointment.  Continue taking Namenda and Aricept.

## 2021-10-24 NOTE — Assessment & Plan Note (Signed)
Continue working on wearing her CPAP machine every night.

## 2021-10-24 NOTE — Progress Notes (Signed)
BP 118/76   Pulse 78   Temp 98 F (36.7 C) (Oral)   Resp 18   Ht 5' 11" (1.803 m)   Wt 165 lb 6.4 oz (75 kg)   SpO2 98%   BMI 23.07 kg/m    Subjective:    Patient ID: Eric Rose, male    DOB: [birth year], 66 y.o.   MRN: [id number]  HPI: Eric Rose is a 66 y.o. male  Chief Complaint  Patient presents with   Hypertension   Hyperlipidemia   Altered Mental Status   Weight Loss   Hypertension: His blood pressure today is 118/76. He says his blood pressure runs around 118/80 at home.  He says he does take his blood pressure medication every day.  He is currently taking lisinopril 20 mg daily.  He denies any chest pain, headaches, shortness of breath or blurred vision.   Hyperlipidemia: His last LDL was 76 on 04/27/2021.  He currently takes pravastatin 20 mg daily.  He denies any myalgia. The 10-year ASCVD risk score (Arnett DK, et al., 2019) is: 13.3%   Values used to calculate the score:     Age: 27 years     Sex: Male     Is Non-Hispanic African American: Yes     Diabetic: No     Tobacco smoker: No     Systolic Blood Pressure: 497 mmHg     Is BP treated: Yes     HDL Cholesterol: 54 mg/dL     Total Cholesterol: 143 mg/dL   OSA on CPAP: She states he tries to wear his CPAP machine every night.  He says he starts wearing it by the end of the night he does take it off.   BPH: He does see urology, [name].  He last saw [name] on 03/03/2021.  He has been treated conservatively.  Avoiding bladder irritants and timed voiding.  Patient states he is doing well.   ED: Patient states he takes his Cialis when he needs it.  He does not use it very often.   Memory impairment: She is currently under the care of [name] neurology for memory impairment.  Patient is currently taking Aricept 5 mg daily and Namenda 5 mg twice a day he last saw neurology on 08/04/2019.  His medications were last refilled on 08/30/2021.  He reports he is doing well. Discussed with patient  and wife that he has not had an appointment with neurology in years. They need to call and schedule a follow up appointment with them.  Weight loss: Patient weighed 173 pounds and 12.8 ounces on 04/27/2021 today he weighs 165 pounds 6.4 ounces. That is a loss of 7.52 lbs. He says he is not trying to lose weight.  He is due for colonoscopy in January. He denies any chest pain, abdominal pain, changes in stool or cough.  Discussed increasing protein in diet.  This can include drinking a Boost every day.  Patient denies any recent illness or fever.  Patient and wife report that he eats well. Patient states he did start working again. He works at [employer] three days a week. Discussed with patient and wife that many things can cause weight loss. Will get labs and chest xray.  Patient has not noticed any blood in his stool but is going to pay closer attention at this time.    Relevant past medical, surgical, family and social history reviewed and updated as indicated. Interim medical history since  our last visit reviewed. Allergies and medications reviewed and updated.  Review of Systems  Constitutional: Negative for fever, positive weight change.  Respiratory: Negative for cough and shortness of breath.   Cardiovascular: Negative for chest pain or palpitations.  Gastrointestinal: Negative for abdominal pain, no bowel changes.  Musculoskeletal: Negative for gait problem or joint swelling.  Skin: Negative for rash.  Neurological: Negative for dizziness or headache.  No other specific complaints in a complete review of systems (except as listed in HPI above).      Objective:    BP 118/76   Pulse 78   Temp 98 F (36.7 C) (Oral)   Resp 18   Ht 5' 11" (1.803 m)   Wt 165 lb 6.4 oz (75 kg)   SpO2 98%   BMI 23.07 kg/m   Wt Readings from Last 3 Encounters:  10/24/21 165 lb 6.4 oz (75 kg)  04/27/21 173 lb 12.8 oz (78.8 kg)  03/10/21 174 lb 14.4 oz (79.3 kg)    Physical Exam  Constitutional:  Patient appears well-developed and well-nourished. No distress.  HEENT: head atraumatic, normocephalic, pupils equal and reactive to light, neck supple Cardiovascular: Normal rate, regular rhythm and normal heart sounds.  No murmur heard. No BLE edema. Pulmonary/Chest: Effort normal and breath sounds normal. No respiratory distress. Abdominal: Soft.  There is no tenderness. Psychiatric: Patient has a normal mood and affect. behavior is normal. Judgment and thought content normal.  Results for orders placed or performed in visit on 04/27/21  Lipid panel  Result Value Ref Range   Cholesterol 143 <200 mg/dL   HDL 54 > OR = 40 mg/dL   Triglycerides 45 <150 mg/dL   LDL Cholesterol (Calc) 76 mg/dL (calc)   Total CHOL/HDL Ratio 2.6 <5.0 (calc)   Non-HDL Cholesterol (Calc) 89 <130 mg/dL (calc)  CBC with Differential/Platelet  Result Value Ref Range   WBC 6.2 3.8 - 10.8 Thousand/uL   RBC 4.54 4.20 - 5.80 Million/uL   Hemoglobin 14.5 13.2 - 17.1 g/dL   HCT 42.8 38.5 - 50.0 %   MCV 94.3 80.0 - 100.0 fL   MCH 31.9 27.0 - 33.0 pg   MCHC 33.9 32.0 - 36.0 g/dL   RDW 11.9 11.0 - 15.0 %   Platelets 192 140 - 400 Thousand/uL   MPV 12.0 7.5 - 12.5 fL   Neutro Abs 3,844 1,500 - 7,800 cells/uL   Lymphs Abs 1,345 850 - 3,900 cells/uL   Absolute Monocytes 601 200 - 950 cells/uL   Eosinophils Absolute 391 15 - 500 cells/uL   Basophils Absolute 19 0 - 200 cells/uL   Neutrophils Relative % 62 %   Total Lymphocyte 21.7 %   Monocytes Relative 9.7 %   Eosinophils Relative 6.3 %   Basophils Relative 0.3 %  COMPLETE METABOLIC PANEL WITH GFR  Result Value Ref Range   Glucose, Bld 85 65 - 99 mg/dL   BUN 13 7 - 25 mg/dL   Creat 0.93 0.70 - 1.35 mg/dL   eGFR 90 > OR = 60 mL/min/1.15m   BUN/Creatinine Ratio NOT APPLICABLE 6 - 22 (calc)   Sodium 139 135 - 146 mmol/L   Potassium 4.3 3.5 - 5.3 mmol/L   Chloride 105 98 - 110 mmol/L   CO2 29 20 - 32 mmol/L   Calcium 9.0 8.6 - 10.3 mg/dL   Total Protein 6.4  6.1 - 8.1 g/dL   Albumin 4.1 3.6 - 5.1 g/dL   Globulin 2.3 1.9 - 3.7 g/dL (calc)  AG Ratio 1.8 1.0 - 2.5 (calc)   Total Bilirubin 0.6 0.2 - 1.2 mg/dL   Alkaline phosphatase (APISO) 75 35 - 144 U/L   AST 28 10 - 35 U/L   ALT 29 9 - 46 U/L      Assessment & Plan:   Problem List Items Addressed This Visit       Cardiovascular and Mediastinum   Essential hypertension, benign - Primary (Chronic)    Blood pressure at goal 118/76.  Continue taking lisinopril 20 mg daily.      Relevant Orders   CBC with Differential/Platelet   COMPLETE METABOLIC PANEL WITH GFR     Respiratory   Obstructive sleep apnea (Chronic)    Continue working on wearing her CPAP machine every night.        Genitourinary   BPH with obstruction/lower urinary tract symptoms    Has been avoiding bladder irritants and using timed voiding.  Followed by Dr. Anitra Lauth      Relevant Orders   PSA     Other   Erectile dysfunction    Continue using Cialis as needed.      Relevant Orders   PSA   Memory impairment    Not been seen by neurology in years.  Recommend calling office to schedule follow-up appointment.  Continue taking Namenda and Aricept.      Mixed hyperlipidemia    Continue taking pravastatin 20 mg daily.      Relevant Orders   Lipid panel   Other Visit Diagnoses     Weight loss       unintentional weight loss, will get labs and chest xray, will be due for colonoscopy in Jan   Relevant Orders   CBC with Differential/Platelet   COMPLETE METABOLIC PANEL WITH GFR   Lipid panel   TSH   HIV Antibody (routine testing w rflx)   Hemoglobin A1c   Hepatitis C antibody   Sedimentation rate   C-reactive protein   PSA   DG Chest 2 View   RPR   Prostate cancer screening       Relevant Orders   PSA        Follow up plan: Return in about 6 months (around 04/26/2022) for follow up.

## 2021-10-24 NOTE — Assessment & Plan Note (Signed)
Continue using Cialis as needed. 

## 2021-10-24 NOTE — Assessment & Plan Note (Addendum)
Blood pressure at goal 118/76.  Continue taking lisinopril 20 mg daily.

## 2021-10-25 LAB — COMPLETE METABOLIC PANEL WITH GFR
AG Ratio: 1.5 (calc) (ref 1.0–2.5)
ALT: 28 U/L (ref 9–46)
AST: 27 U/L (ref 10–35)
Albumin: 4 g/dL (ref 3.6–5.1)
Alkaline phosphatase (APISO): 86 U/L (ref 35–144)
BUN: 13 mg/dL (ref 7–25)
CO2: 28 mmol/L (ref 20–32)
Calcium: 9.3 mg/dL (ref 8.6–10.3)
Chloride: 104 mmol/L (ref 98–110)
Creat: 0.98 mg/dL (ref 0.70–1.35)
Globulin: 2.6 g/dL (calc) (ref 1.9–3.7)
Glucose, Bld: 69 mg/dL (ref 65–99)
Potassium: 4.8 mmol/L (ref 3.5–5.3)
Sodium: 141 mmol/L (ref 135–146)
Total Bilirubin: 0.5 mg/dL (ref 0.2–1.2)
Total Protein: 6.6 g/dL (ref 6.1–8.1)
eGFR: 85 mL/min/{1.73_m2} (ref >=60)

## 2021-10-25 LAB — CBC WITH DIFFERENTIAL/PLATELET
Absolute Monocytes: 685 cells/uL (ref 200–950)
Basophils Absolute: 19 cells/uL (ref 0–200)
Basophils Relative: 0.3 %
Eosinophils Absolute: 410 cells/uL (ref 15–500)
Eosinophils Relative: 6.4 %
HCT: 44.7 % (ref 38.5–50.0)
Hemoglobin: 15.1 g/dL (ref 13.2–17.1)
Lymphs Abs: 1094 cells/uL (ref 850–3900)
MCH: 32 pg (ref 27.0–33.0)
MCHC: 33.8 g/dL (ref 32.0–36.0)
MCV: 94.7 fL (ref 80.0–100.0)
MPV: 11.3 fL (ref 7.5–12.5)
Monocytes Relative: 10.7 %
Neutro Abs: 4192 cells/uL (ref 1500–7800)
Neutrophils Relative %: 65.5 %
Platelets: 211 10*3/uL (ref 140–400)
RBC: 4.72 10*6/uL (ref 4.20–5.80)
RDW: 11.8 % (ref 11.0–15.0)
Total Lymphocyte: 17.1 %
WBC: 6.4 10*3/uL (ref 3.8–10.8)

## 2021-10-25 LAB — LIPID PANEL
Cholesterol: 143 mg/dL (ref <200)
HDL: 58 mg/dL (ref >=40)
LDL Cholesterol (Calc): 72 mg/dL (calc)
Non-HDL Cholesterol (Calc): 85 mg/dL (calc) (ref <130)
Total CHOL/HDL Ratio: 2.5 (calc) (ref <5.0)
Triglycerides: 52 mg/dL (ref <150)

## 2021-10-25 LAB — HEMOGLOBIN A1C
Hgb A1c MFr Bld: 5.3 % of total Hgb (ref <5.7)
Mean Plasma Glucose: 105 mg/dL
eAG (mmol/L): 5.8 mmol/L

## 2021-10-25 LAB — RPR: RPR Ser Ql: NONREACTIVE

## 2021-10-25 LAB — HEPATITIS C ANTIBODY: Hepatitis C Ab: NONREACTIVE

## 2021-10-25 LAB — C-REACTIVE PROTEIN: CRP: 1.7 mg/L (ref <8.0)

## 2021-10-25 LAB — HIV ANTIBODY (ROUTINE TESTING W REFLEX): HIV 1&2 Ab, 4th Generation: NONREACTIVE

## 2021-10-25 LAB — SEDIMENTATION RATE: Sed Rate: 6 mm/h (ref 0–20)

## 2021-10-25 LAB — TSH: TSH: 1.57 mIU/L (ref 0.40–4.50)

## 2021-10-25 LAB — PSA: PSA: 2.08 ng/mL (ref <=4.00)

## 2021-11-07 ENCOUNTER — Other Ambulatory Visit: Payer: Self-pay | Admitting: Nurse Practitioner

## 2021-11-08 NOTE — Telephone Encounter (Signed)
Requested Prescriptions  Pending Prescriptions Disp Refills  . memantine (NAMENDA) 5 MG tablet [Pharmacy Med Name: Memantine HCl 5 MG Oral Tablet] 60 tablet 0    Sig: Take 1 tablet by mouth twice daily     Neurology:  Alzheimer's Agents 2 Passed - 11/07/2021  9:14 AM      Passed - Cr in normal range and within 360 days    Creat  Date Value Ref Range Status  10/24/2021 0.98 0.70 - 1.35 mg/dL Final         Passed - eGFR is 5 or above and within 360 days    GFR, Est African American  Date Value Ref Range Status  04/07/2019 104 > OR = 60 mL/min/1.97m Final   GFR calc Af Amer  Date Value Ref Range Status  11/26/2019 >60 >60 mL/min Final   GFR, Est Non African American  Date Value Ref Range Status  04/07/2019 89 > OR = 60 mL/min/1.741mFinal   GFR calc non Af Amer  Date Value Ref Range Status  11/26/2019 >60 >60 mL/min Final   eGFR  Date Value Ref Range Status  10/24/2021 85 > OR = 60 mL/min/1.735minal         Passed - Valid encounter within last 6 months    Recent Outpatient Visits          2 weeks [facility], [facility]   6 months [facility] hypertension, [facility]   10 months ago [facility]avicle enlargement   [facility]   1 year ago [facility]structive sleep apnea   [facility]   2 years ago [facility], right   [facility]      Future Appointments            In 3 months McGowan, ShaGordan PaymentrLittle SilverIn 4 months  [facility] PenReece PackerulMyna HidalgoNPGrand [facility]unctionr[facility]

## 2021-11-21 ENCOUNTER — Encounter: Payer: Self-pay | Admitting: Podiatry

## 2021-11-21 ENCOUNTER — Ambulatory Visit: Payer: PPO | Admitting: Podiatry

## 2021-11-21 DIAGNOSIS — D2371 Other benign neoplasm of skin of right lower limb, including hip: Secondary | ICD-10-CM | POA: Diagnosis not present

## 2021-11-21 DIAGNOSIS — M7752 Other enthesopathy of left foot: Secondary | ICD-10-CM

## 2021-11-21 DIAGNOSIS — L03119 Cellulitis of unspecified part of limb: Secondary | ICD-10-CM

## 2021-11-21 DIAGNOSIS — D2372 Other benign neoplasm of skin of left lower limb, including hip: Secondary | ICD-10-CM | POA: Diagnosis not present

## 2021-11-21 DIAGNOSIS — L02619 Cutaneous abscess of unspecified foot: Secondary | ICD-10-CM

## 2021-11-21 MED ORDER — DEXAMETHASONE SODIUM PHOSPHATE 120 MG/30ML IJ SOLN
2.0000 mg | Freq: Once | INTRAMUSCULAR | Status: AC
  Administered 2021-11-21: 2 mg via INTRA_ARTICULAR

## 2021-11-21 NOTE — Progress Notes (Signed)
He presents today complaining of calluses bilaterally.  States this 1 hurt really hurts the most as he points to the fifth metatarsal head of the left foot plantarly.  Objective: Vital signs are stable he is alert and oriented x3 he has 2 small lesions one centrally located over the plantar heel right and 1 over the fifth metatarsal head right.  However the one over the fifth metatarsal head left does demonstrate a very thick callused benign skin lesion but it also demonstrates some subcutaneous undermining of the yellow fluid.  There is some mild surrounding erythema noted cellulitis noticeable.  Assessment: Benign skin lesions right foot x2.  Benign skin lesion x1 left foot.  Plan: I injected Sub lesion only Sub fifth metatarsal head of the left foot today with local anesthetic.  He tolerated this procedure well.  I then curettaged and enucleated the lesion which did have a small abscess noted deep.  I cultured this and sent for pathologic evaluation and I also sent the lesion itself which were in multiple small pieces greater than 1 cm in total thickness and in diameter to pathology as well.  I flushed the area copiously today no phenol was used.  I then placed a Silvadene cream Telfa pad and dressed a compressive dressing he was given both oral written home-going instructions for care and soaking of the foot.  I would like to follow-up with him in 2 weeks.

## 2021-11-25 ENCOUNTER — Encounter: Payer: Self-pay | Admitting: Podiatry

## 2021-11-29 ENCOUNTER — Other Ambulatory Visit: Payer: Self-pay | Admitting: Nurse Practitioner

## 2021-11-29 ENCOUNTER — Telehealth: Payer: Self-pay | Admitting: *Deleted

## 2021-11-29 NOTE — Telephone Encounter (Signed)
-----   Message from Garrel Ridgel, [place] sent at 11/29/2021  7:40 AM EDT ----- Negative for wart or skin cancer

## 2021-11-30 NOTE — Telephone Encounter (Signed)
Requested Prescriptions  Pending Prescriptions Disp Refills  . memantine (NAMENDA) 5 MG tablet [Pharmacy Med Name: Memantine HCl 5 MG Oral Tablet] 60 tablet 3    Sig: Take 1 tablet by mouth twice daily     Neurology:  Alzheimer's Agents 2 Passed - 11/29/2021  2:30 PM      Passed - Cr in normal range and within 360 days    Creat  Date Value Ref Range Status  10/24/2021 0.98 0.70 - 1.35 mg/dL Final         Passed - eGFR is 5 or above and within 360 days    GFR, Est African American  Date Value Ref Range Status  04/07/2019 104 > OR = 60 mL/min/1.52m Final   GFR calc Af Amer  Date Value Ref Range Status  11/26/2019 >60 >60 mL/min Final   GFR, Est Non African American  Date Value Ref Range Status  04/07/2019 89 > OR = 60 mL/min/1.755mFinal   GFR calc non Af Amer  Date Value Ref Range Status  11/26/2019 >60 >60 mL/min Final   eGFR  Date Value Ref Range Status  10/24/2021 85 > OR = 60 mL/min/1.7363minal         Passed - Valid encounter within last 6 months    Recent Outpatient Visits          1 month ago [facility]ential hypertension, [facility]   7 months ago [facility]ential hypertension, [facility]   10 months ago Clavicle enlargement   [facility]   1 year ago Obstructive sleep apnea   [facility]   2 years ago Hip pain, right   [facility]      Future Appointments            In 3 months McGowan, ShaGordan PaymentrRotanIn 3 months  [facility] 4 months PenReece PackerulMyna HidalgoNPSt[facility]il next OV with PCP

## 2021-12-07 ENCOUNTER — Ambulatory Visit: Payer: PPO | Admitting: Podiatry

## 2021-12-24 ENCOUNTER — Other Ambulatory Visit: Payer: Self-pay | Admitting: Nurse Practitioner

## 2021-12-26 NOTE — Telephone Encounter (Signed)
Requested Prescriptions  Pending Prescriptions Disp Refills  . donepezil (ARICEPT) 5 MG tablet [Pharmacy Med Name: Donepezil HCl 5 MG Oral Tablet] 90 tablet 1    Sig: TAKE 1 TABLET BY MOUTH ONCE DAILY WITH SUPPER     Neurology:  Alzheimer's Agents Passed - 12/24/2021  3:17 PM      Passed - Valid encounter within last 6 months    Recent Outpatient Visits          2 months ago Essential hypertension, benign   [facility] Bo Merino, FNP   8 months ago Essential hypertension, benign   [facility] Bo Merino, FNP   11 months ago Clavicle enlargement   [place], NP   1 year ago Obstructive sleep apnea   [place], DO   2 years ago Hip pain, right   [facility] Towanda Malkin, MD      Future Appointments            In 2 months McGowan, Shannon A, [place]   In 2 months  [facility], [place]   In 4 months Reece Packer, Myna Hidalgo, [facility], [facility]

## 2022-01-05 ENCOUNTER — Ambulatory Visit: Payer: PPO | Admitting: Podiatry

## 2022-01-05 DIAGNOSIS — D2371 Other benign neoplasm of skin of right lower limb, including hip: Secondary | ICD-10-CM | POA: Diagnosis not present

## 2022-01-05 DIAGNOSIS — D2372 Other benign neoplasm of skin of left lower limb, including hip: Secondary | ICD-10-CM | POA: Diagnosis not present

## 2022-01-05 NOTE — Progress Notes (Signed)
He presents today for follow up from calluses with biopsy and removal by Dr. Milinda Pointer.  He states he is doing a lot better no pain.  He wants to go over the results he denies any other acute complaints.  Objective: Vital signs are stable he is alert and oriented x3.  All lesions have clinically healed no further pain noted upon palpation.  Assessment: Benign skin lesions right foot x2.  Benign skin lesion x1 left foot.  Plan: Clinically healed no further pain noted.  I reviewed the path report of the lesion which shows hyperkeratotic tissue.  No concern for skin cancer or plantar verruca.  At this point I discussed prevention technique shoe gear modification if any foot and ankle issues were in the future advised him to come back and see me.

## 2022-01-18 ENCOUNTER — Encounter: Payer: Self-pay | Admitting: Podiatry

## 2022-01-18 ENCOUNTER — Ambulatory Visit: Payer: PPO | Admitting: Podiatry

## 2022-01-18 DIAGNOSIS — D2372 Other benign neoplasm of skin of left lower limb, including hip: Secondary | ICD-10-CM

## 2022-01-18 NOTE — Progress Notes (Signed)
Eric Rose presents today for follow-up of his painful callus plantar aspect of his left foot.  States that he did really well and felt great for a long time but feels like it starting to come back.  He is referring to the curettage was performed last time he was in.  Objective: Vital signs are stable he is alert and oriented x3.  Pulses remain palpable left foot demonstrates a overgrowth of reactive keratosis Sub fifth metatarsal head of the left foot.  Nontender on palpation or debridement.  Assessment: Painful benign skin lesion left foot.  Plan: Mechanical debridement of the lesion today followed by Salinocaine under occlusion to be left on 3 days and then washed off thoroughly.  I would like to follow-up with him in 3 to 4 months should he have questions or concerns he will notify us immediately.

## 2022-03-02 ENCOUNTER — Ambulatory Visit: Payer: PPO | Admitting: Urology

## 2022-03-03 ENCOUNTER — Ambulatory Visit: Payer: PPO | Admitting: Urology

## 2022-03-08 ENCOUNTER — Other Ambulatory Visit: Payer: Self-pay | Admitting: Family Medicine

## 2022-03-08 DIAGNOSIS — N529 Male erectile dysfunction, unspecified: Secondary | ICD-10-CM

## 2022-03-10 NOTE — Telephone Encounter (Signed)
Requested Prescriptions  Pending Prescriptions Disp Refills   tadalafil (CIALIS) 10 MG tablet [Pharmacy Med Name: Tadalafil 10 MG Oral Tablet] 90 tablet 0    Sig: TAKE 1 TABLET BY MOUTH 30 MINUTES PRIOR TO PLANNED SEXUAL ACTIVITY     Urology: Erectile Dysfunction Agents Passed - 03/08/2022  2:16 PM      Passed - AST in normal range and within 360 days    AST  Date Value Ref Range Status  10/24/2021 27 10 - 35 U/L Final         Passed - ALT in normal range and within 360 days    ALT  Date Value Ref Range Status  10/24/2021 28 9 - 46 U/L Final         Passed - Last BP in normal range    BP Readings from Last 1 Encounters:  10/24/21 118/76         Passed - Valid encounter within last 12 months    Recent Outpatient Visits           4 months ago Essential hypertension, benign   [facility] Bo Merino, FNP   10 months ago Essential hypertension, benign   [facility] Bo Merino, FNP   1 year ago Clavicle enlargement   [place], NP   1 year ago Obstructive sleep apnea   [place], DO   2 years ago Hip pain, right   [facility] Towanda Malkin, MD       Future Appointments             In 4 days Reece Packer, Myna Hidalgo, [facility], [place]   In 1 week McGowan, Gordan Payment [place]   In 1 month Reece Packer, Myna Hidalgo, [facility], [facility]

## 2022-03-14 ENCOUNTER — Encounter: Payer: Self-pay | Admitting: Nurse Practitioner

## 2022-03-14 ENCOUNTER — Other Ambulatory Visit: Payer: Self-pay

## 2022-03-14 ENCOUNTER — Ambulatory Visit (INDEPENDENT_AMBULATORY_CARE_PROVIDER_SITE_OTHER): Payer: PPO | Admitting: Nurse Practitioner

## 2022-03-14 VITALS — BP 118/74 | HR 93 | Temp 98.1°F | Resp 16 | Ht 71.0 in | Wt 170.8 lb

## 2022-03-14 DIAGNOSIS — Z0000 Encounter for general adult medical examination without abnormal findings: Secondary | ICD-10-CM

## 2022-03-14 DIAGNOSIS — J069 Acute upper respiratory infection, unspecified: Secondary | ICD-10-CM

## 2022-03-14 DIAGNOSIS — Z1211 Encounter for screening for malignant neoplasm of colon: Secondary | ICD-10-CM

## 2022-03-14 LAB — POCT INFLUENZA A/B
Influenza A, POC: NEGATIVE
Influenza B, POC: NEGATIVE

## 2022-03-14 MED ORDER — BENZONATATE 100 MG PO CAPS: 200.0000 mg | ORAL_CAPSULE | Freq: Two times a day (BID) | ORAL | 0 refills | Status: DC | PRN

## 2022-03-14 NOTE — Progress Notes (Signed)
Subjective:   Eric Rose is a 67 y.o. male who presents for Medicare Annual/Subsequent preventive examination and acute illness   URI: patient reports symptoms started Saturday.  Symptoms include cough, fever, fatigue, nasal congestion. He denies any shortness of breath. He says he has taken some Nyquil.  Will get flu and covid test.   Flu negative.  Covid PCR pending.  Discussed covid treatment if positive.  He would like to be treated if positive.  Recommend pushing fluids,  getting rest.  Will send in tessalon perls.   Review of Systems:  Constitutional: positive for fever, negative weight change.  HEENT: positive for nasal congestion Respiratory: positive  for cough and negative for shortness of breath.   Cardiovascular: Negative for chest pain or palpitations.  Gastrointestinal: Negative for abdominal pain, no bowel changes.  Musculoskeletal: Negative for gait problem or joint swelling.  Skin: Negative for rash.  Neurological: Negative for dizziness or headache.  No other specific complaints in a complete review of systems (except as listed in HPI above).        Objective:    Vitals: BP 118/74   Pulse 93   Temp 98.1 F (36.7 C) (Oral)   Resp 16   Ht '5\' 11"'$  (1.803 m)   Wt 170 lb 12.8 oz (77.5 kg)   SpO2 98%   BMI 23.82 kg/m   Body mass index is 23.82 kg/m.  Constitutional: Patient appears well-developed and well-nourished.  No distress.  HEENT: head atraumatic, normocephalic, pupils equal and reactive to light, ears TMs clear, neck supple, throat within normal limits Cardiovascular: Normal rate, regular rhythm and normal heart sounds.  No murmur heard. No BLE edema. Pulmonary/Chest: Effort normal and breath sounds normal. No respiratory distress. Abdominal: Soft.  There is no tenderness. Psychiatric: Patient has a normal mood and affect. behavior is normal. Judgment and thought content normal.     03/10/2021    2:04 PM 02/26/2020    9:52 AM 11/26/2019    8:00  PM 11/26/2019   12:46 PM 05/06/2018   12:41 PM 02/07/2016    2:21 PM 08/06/2015    3:50 PM  Advanced Directives  Does Patient Have a Medical Advance Directive? Yes Yes Yes Yes No Yes Yes  Type of Paramedic of [place];Living will [place];Living will Healthcare Power of [place] will Living will  Does patient want to make changes to medical advance directive?   No - Patient declined      Copy of [place] in Chart? No - copy requested No - copy requested No - copy requested No - copy requested   No - copy requested  Would patient like information on creating a medical advance directive?     Yes (MAU/Ambulatory/Procedural Areas - Information given)      Tobacco Social History   Tobacco Use  Smoking Status Never  Smokeless Tobacco Never     Counseling given: Not Answered   Clinical Intake:  Pre-visit preparation completed: No  Pain : No/denies pain     BMI - recorded: 23.82 Nutritional Status: BMI of 19-24  Normal Nutritional Risks: None Diabetes: No  How often do you need to have someone help you when you read instructions, pamphlets, or other written materials from your doctor or pharmacy?: 5 - Always  Interpreter Needed?: No     Past Medical History:  Diagnosis Date   Allergy    Anxiety  BPH (benign prostatic hypertrophy) with urinary obstruction    followed by urologist   Decreased libido    Dementia ([place])    Erectile dysfunction 08/08/2015   GERD (gastroesophageal reflux disease)    History of pneumonia    Hypertension    Lumbago    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   Around 2000, damage to left eye (traumatic, playing tennis)   Past Surgical History:  Procedure Laterality Date   JOINT REPLACEMENT Bilateral    hip   ORIF FEMUR FRACTURE Right 11/26/2019   Procedure: OPEN REDUCTION INTERNAL FIXATION (ORIF) proximal FEMUR FRACTURE;  Surgeon: Lovell Sheehan, MD;  Location: [facility] ORS;  Service: Orthopedics;  Laterality: Right;   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   Procedure: TOTAL HIP ARTHROPLASTY;  Surgeon: Earnestine Leys, MD;  Location: [facility] ORS;  Service: Orthopedics;  Laterality: Left;   Family History  Problem Relation Age of Onset   Diabetes Mother    Heart disease Mother    Alzheimer's disease Mother    Prostate cancer Father    Prostate cancer Brother    Prostate cancer Paternal Uncle    Bladder Cancer Neg Hx    Kidney cancer Neg Hx    Social History   Socioeconomic History   Marital status: Married    Spouse name: [name]   Number of children: 3   Years of education: 12   Highest education level: Some college, no degree  Occupational History   Occupation: retired  Tobacco Use   Smoking status: Never   Smokeless tobacco: Never  Vaping Use   Vaping Use: Never used  Substance and Sexual Activity   Alcohol use: No    Alcohol/week: 0.0 standard drinks of alcohol   Drug use: No   Sexual activity: Yes  Other Topics Concern   Not on file  Social History Narrative   Retired from [employer] 12/2018   Social Determinants of Health   Financial Resource Strain: [place]  (03/14/2022)   Overall Financial Resource Strain (CARDIA)    Difficulty of Paying Living Expenses: Not hard at all  Food Insecurity: No Food Insecurity (03/14/2022)   Hunger Vital Sign    Worried About Running Out of Food in the Last Year: Never true    [place] in the Last Year: Never true  Transportation Needs: No Transportation Needs (03/14/2022)   PRAPARE - Hydrologist (Medical): No    Lack of Transportation (Non-Medical): No  Physical Activity: Inactive (03/14/2022)   Exercise Vital Sign    Days of Exercise per Week: 0 days    Minutes of Exercise per Session: 0 min  Stress: No Stress Concern Present (03/14/2022)   [place]    Feeling of Stress : Not at  all  Social Connections: [place] (03/14/2022)   Social Connection and Isolation Panel [NHANES]    Frequency of Communication with Friends and Family: Twice a week    Frequency of Social Gatherings with Friends and Family: Twice a week    Attends Religious Services: More than 4 times per year    Active Member of [employer] or Organizations: Yes    Attends Archivist Meetings: More than 4 times per year    Marital Status: Married    Outpatient Encounter Medications as of 03/14/2022  Medication Sig   acetaminophen (TYLENOL) 500 MG tablet Take 500-1,000 mg by mouth daily.   benzonatate (TESSALON) 100  MG capsule Take 2 capsules (200 mg total) by mouth 2 (two) times daily as needed for cough.   Cyanocobalamin (VITAMIN B-12 PO) Take 1 tablet by mouth daily.   donepezil (ARICEPT) 5 MG tablet TAKE 1 TABLET BY MOUTH ONCE DAILY WITH SUPPER   lisinopril (ZESTRIL) 20 MG tablet Take 1 tablet (20 mg total) by mouth daily.   memantine (NAMENDA) 5 MG tablet Take 1 tablet by mouth twice daily   Multiple Vitamin (MULTIVITAMIN) capsule Take 1 capsule by mouth daily. 50 +   pravastatin (PRAVACHOL) 20 MG tablet Take 1 tablet (20 mg total) by mouth at bedtime.   tadalafil (CIALIS) 10 MG tablet TAKE 1 TABLET BY MOUTH 30 MINUTES PRIOR TO PLANNED SEXUAL ACTIVITY   VITAMIN D, ERGOCALCIFEROL, PO Take 1,000 mg by mouth daily.    Vitamin E 400 units TABS Take 400 mg by mouth daily.    No facility-administered encounter medications on file as of 03/14/2022.    Activities of Daily Living    03/14/2022    2:12 PM 10/24/2021   11:02 AM  In your present state of health, do you have any difficulty performing the following activities:  Hearing? 1 0  Vision? 0 0  Difficulty concentrating or making decisions? 1 1  Walking or climbing stairs? 0 0  Dressing or bathing? 0 0  Doing errands, shopping? 0 0    Patient Care Team: Bo Merino, FNP as PCP - General (Nurse Practitioner) Vertell Limber  (Neurology) Laneta Simmers as Physician Assistant (Urology)   Assessment:   This is a routine wellness examination for Jacob.  Exercise Activities and Dietary recommendations     Goals Addressed   None     Fall Risk:    03/14/2022    2:12 PM 10/24/2021   11:02 AM 04/27/2021   10:11 AM 03/10/2021    2:06 PM 01/06/2021    1:17 PM  Fall Risk   Falls in the past year? 0 0 0 0   Number falls in past yr: 0 0 0 0 0  Injury with Fall? 0 0 0 0 0  Risk for fall due to :    No Fall Risks No Fall Risks  Follow up Falls evaluation completed Falls evaluation completed Falls evaluation completed Falls prevention discussed Falls prevention discussed    FALL RISK PREVENTION PERTAINING TO THE HOME:  Any stairs in or around the home? Yes  If so, are there any without handrails? Yes   Home free of loose throw rugs in walkways, pet beds, electrical cords, etc? No  Adequate lighting in your home to reduce risk of falls? Yes   ASSISTIVE DEVICES UTILIZED TO PREVENT FALLS:  Life alert? No  Use of a cane, walker or w/c? No  Grab bars in the bathroom? No  Shower chair or bench in shower? No  Elevated toilet seat or a handicapped toilet? No   TIMED UP AND GO:  Was the test performed? No .  Length of time to ambulate 10 feet: 5 sec.   GAIT:  Appearance of gait: Gait steady and fast slow with/without the use of an assistive device. OR Gait slow and steady slow with/without the use of an assistive device.  Education: Fall risk prevention has been discussed.  Intervention(s) required? No  DME/home health order needed?  No   Depression Screen    03/14/2022    2:12 PM 10/24/2021   11:02 AM 03/10/2021    2:03 PM 01/06/2021  1:17 PM  PHQ 2/9 Scores  PHQ - 2 Score 0 0 0 0  PHQ- 9 Score    0    Cognitive Function    03/14/2022    2:53 PM  MMSE - Mini Mental State Exam  Orientation to time 5  Orientation to Place 5  Registration 3  Attention/ Calculation 5  Recall 3   Language- name 2 objects 2  Language- repeat 1  Language- follow 3 step command 3  Language- read & follow direction 1  Write a sentence 1  Copy design 1  Total score 30        Immunization History  Administered Date(s) Administered   Influenza Inj Mdck Quad Pf 12/06/2017   Influenza,inj,Quad PF,6+ Mos 02/18/2016, 12/29/2016, 11/19/2018   PFIZER(Purple Top)SARS-COV-2 Vaccination 05/15/2019, 06/04/2019   Tdap 03/12/2018    Qualifies for Shingles Vaccine? No  Zostavax completed . Due for Shingrix. Education has been provided regarding the importance of this vaccine. Pt has been advised to call insurance company to determine out of pocket expense. Advised may also receive vaccine at local pharmacy or Health Dept. Verbalized acceptance and understanding.  Tdap: Although this vaccine is not a covered service during a Wellness Exam, does the patient still wish to receive this vaccine today?  Yes .  Education has been provided regarding the importance of this vaccine. Advised may receive this vaccine at local pharmacy or Health Dept. Aware to provide a copy of the vaccination record if obtained from local pharmacy or Health Dept. Verbalized acceptance and understanding.  Flu Vaccine: Due for Flu vaccine. Does the patient want to receive this vaccine today?   NO . Complaining of cough and congested. Education has been provided regarding the importance of this vaccine but still declined. Advised may receive this vaccine at local pharmacy or Health Dept. Aware to provide a copy of the vaccination record if obtained from local pharmacy or Health Dept. Verbalized acceptance and understanding.  Pneumococcal Vaccine: Due for Pneumococcal vaccine. Does the patient want to receive this vaccine today?  Yes . Education has been provided regarding the importance of this vaccine but still declined. Advised may receive this vaccine at local pharmacy or Health Dept. Aware to provide a copy of the vaccination  record if obtained from local pharmacy or Health Dept. Verbalized acceptance and understanding.   Covid-19 Vaccine: Has had 2 vaccines and 1 booster Information provided/ Completed vaccines  Screening Tests Health Maintenance  Topic Date Due   Medicare Annual Wellness (AWV)  03/10/2022   COLONOSCOPY (Pts 45-82yr Insurance coverage will need to be confirmed)  03/13/2022   COVID-19 Vaccine (3 - Pfizer risk series) 03/30/2022 (Originally 07/02/2019)   INFLUENZA VACCINE  06/11/2022 (Originally 10/11/2021)   Zoster Vaccines- Shingrix (1 of 2) 06/13/2022 (Originally 12/23/1974)   Pneumonia Vaccine 69 Years old (1 - PCV) 10/25/2022 (Originally 12/24/2018)   DTaP/Tdap/Td (2 - Td or Tdap) 03/12/2028   Hepatitis C Screening  Completed   HPV VACCINES  Aged Out   Cancer Screenings:  Colorectal Screening: Overdue . Repeat every 10 years; No longer required. Referral to GI placed today. Pt aware the office will call re: appt.  Lung Cancer Screening: (Low Dose CT Chest recommended if Age 69-80years, 30 pack-year currently smoking OR have quit w/in 15years.) does not qualify.   Lung Cancer Screening Referral: An Epic message has been sent to SBurgess Estelle RN (Oncology Nurse Navigator) regarding the possible need for this exam. SRaquel Sarnawill review the patient's chart  to determine if the patient truly qualifies for the exam. If the patient qualifies, Raquel Sarna will order the Low Dose CT of the chest to facilitate the scheduling of this exam.  Additional Screening:  Hepatitis C Screening: does qualify; Completed 10/24/2021  Vision Screening: Recommended annual ophthalmology exams for early detection of glaucoma and other disorders of the eye. Is the patient up to date with their annual eye exam?  No  Who is the provider or what is the name of the office in which the pt attends annual eye exams?  If pt is not established with a provider, would they like to be referred to a provider to establish care? No .  Wife will make appointment. Ophthalmology referral has been placed. Pt aware the office will call re: appt.  Dental Screening: Recommended annual dental exams for proper oral hygiene  Community Resource Referral:  CRR required this visit?  No        Plan:  I have personally reviewed and addressed the Medicare Annual Wellness questionnaire and have noted the following in the patient's chart:  A. Medical and social history B. Use of alcohol, tobacco or illicit drugs  C. Current medications and supplements D. Functional ability and status E.  Nutritional status F.  Physical activity G. Advance directives H. List of other physicians I.  Hospitalizations, surgeries, and ER visits in previous 12 months J.  [place] such as hearing and vision if needed, cognitive and depression L. Referrals and appointments   In addition, I have reviewed and discussed with patient certain preventive protocols, quality metrics, and best practice recommendations. A written personalized care plan for preventive services as well as general preventive health recommendations were provided to patient.   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   Memory impairment 02/07/2016   Erectile dysfunction 08/08/2015   Anemia 08/06/2015   Essential hypertension, benign 08/06/2015   S/P total hip arthroplasty 04/14/2015   Elevated PSA 01/12/2015   BPH with obstruction/lower urinary tract symptoms 01/12/2015   Past Medical History:  Diagnosis Date   Allergy    Anxiety    BPH (benign prostatic hypertrophy) with urinary obstruction    followed by urologist   Decreased libido     Dementia ([place])    Erectile dysfunction 08/08/2015   GERD (gastroesophageal reflux disease)    History of pneumonia    Hypertension    Lumbago    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 Encounter for Medicare annual wellness exam   2. Viral upper respiratory tract infection Push fluids  get rest Will send in antiviral treatment if covid positive Can take vitamin c, d, and zinc Can take mucinex, flonase and zyrtec - Novel Coronavirus, NAA ([facility]) - POCT Influenza A/B - benzonatate (TESSALON) 100 MG capsule; Take 2 capsules (200 mg total) by mouth 2 (two) times daily as needed for cough.  Dispense: 20 capsule; Refill: 0  3. Encounter for colorectal cancer screening  - Ambulatory referral to Gastroenterology

## 2022-03-15 LAB — NOVEL CORONAVIRUS, NAA: SARS-CoV-2, NAA: DETECTED — AB

## 2022-03-16 ENCOUNTER — Other Ambulatory Visit: Payer: Self-pay | Admitting: Nurse Practitioner

## 2022-03-16 DIAGNOSIS — U071 COVID-19: Secondary | ICD-10-CM

## 2022-03-16 MED ORDER — NIRMATRELVIR/RITONAVIR (PAXLOVID)TABLET: 3.0000 | ORAL_TABLET | Freq: Two times a day (BID) | ORAL | 0 refills | Status: AC

## 2022-03-22 NOTE — Progress Notes (Signed)
03/26/2022  10:58 PM   Eric Rose 09-17-1955 [id number]  Referring provider: Towanda Malkin, MD No address on file  Urological history: 1. Elevated PSA -PSA trend -Discontinued the Jalyn in 2019   PSA  Latest Ref Rng < OR = 4.00 ng/mL  10/24/2021 2.08    2  Family history of prostate cancer -father, brother and paternal uncle have been diagnosed with prostate cancer  3. BPH with LU TS -I PSS 12/2  4. ED -Contributing factors of age, BPH, hypertension, sleep apnea, degenerative joint disease of the lumbar spine, hyperlipidemia and anxiety -SHIM 18 -Tadalafil 10 mg, on-demand dosing   HPI: Eric Rose is a 67 y.o. male who presents today for 6 month follow up.  He is having urgency after drinking coffee.  He does not find this occurring with other liquids.  Patient denies any modifying or aggravating factors.  Patient denies any gross hematuria, dysuria or suprapubic/flank pain.  Patient denies any fevers, chills, nausea or vomiting.     IPSS     Row Name 03/23/22 1500         International Prostate Symptom Score   How often have you had the sensation of not emptying your bladder? Less than half the time     How often have you had to urinate less than every two hours? Less than half the time     How often have you found you stopped and started again several times when you urinated? Less than half the time     How often have you found it difficult to postpone urination? Less than half the time     How often have you had a weak urinary stream? About half the time     How often have you had to strain to start urination? Less than 1 in 5 times     Total IPSS Score 12       Quality of Life due to urinary symptoms   If you were to spend the rest of your life with your urinary condition just the way it is now how would you feel about that? Mostly Satisfied                Score:  1-7 Mild 8-19 Moderate 20-35 Severe  Patient still having  spontaneous erections.  //He denies any pain or curvature with erections.     SHIM     Row Name 03/23/22 1544         SHIM: Over the last 6 months:   How do you rate your confidence that you could get and keep an erection? Moderate     When you had erections with sexual stimulation, how often were your erections hard enough for penetration (entering your partner)? Sometimes (about half the time)     During sexual intercourse, how often were you able to maintain your erection after you had penetrated (entered) your partner? Sometimes (about half the time)     During sexual intercourse, how difficult was it to maintain your erection to completion of intercourse? Not Difficult     When you attempted sexual intercourse, how often was it satisfactory for you? 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   Around 2000, damage to left eye (traumatic, playing tennis)    Surgical History: Past Surgical History:  Procedure Laterality Date   JOINT REPLACEMENT Bilateral    hip   ORIF FEMUR FRACTURE Right 11/26/2019   Procedure: OPEN REDUCTION INTERNAL FIXATION (ORIF) proximal FEMUR FRACTURE;  Surgeon: Lovell Sheehan, MD;  Location: [facility] ORS;  Service: Orthopedics;  Laterality: Right;   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   Procedure: TOTAL HIP ARTHROPLASTY;  Surgeon: Earnestine Leys, MD;  Location: [facility] ORS;  Service: Orthopedics;  Laterality: Left;    Home Medications:  Allergies as of  03/23/2022   No Known Allergies      Medication List        Accurate as of March 23, 2022 11:59 PM. If you have any questions, ask your nurse or doctor.          acetaminophen 500 MG tablet Commonly known as: TYLENOL Take 500-1,000 mg by mouth daily.   benzonatate 100 MG capsule Commonly known as: TESSALON Take 2 capsules (200 mg total) by mouth 2 (two) times daily as needed for cough.   donepezil 5 MG tablet Commonly known as: ARICEPT TAKE 1 TABLET BY MOUTH ONCE DAILY WITH SUPPER   lisinopril 20 MG tablet Commonly known as: ZESTRIL Take 1 tablet (20 mg total) by mouth daily.   memantine 5 MG tablet Commonly known as: NAMENDA Take 1 tablet by mouth twice daily   multivitamin capsule Take 1 capsule by mouth daily. 50 +   pravastatin 20 MG tablet Commonly known as: PRAVACHOL Take 1 tablet (20 mg total) by mouth at bedtime.   tadalafil 5 MG tablet Commonly known as: CIALIS Take 1 tablet (5 mg total) by mouth daily as needed for erectile dysfunction. What changed:  medication strength how much to take how to take this when to take this reasons to take this additional instructions Changed by: Carie Kapuscinski, PA-C   VITAMIN B-12 PO Take 1 tablet by mouth daily.   VITAMIN D (ERGOCALCIFEROL) PO Take 1,000 mg by mouth daily.   Vitamin E 400 units Tabs Take 400 mg by mouth daily.        Allergies: No Known Allergies  Family History: Family History  Problem Relation Age of Onset   Diabetes Mother    Heart disease Mother    Alzheimer's disease Mother    Prostate cancer Father    Prostate cancer Brother    Prostate cancer Paternal Uncle    Bladder Cancer Neg Hx    Kidney cancer Neg Hx     Social History:  reports that he has never smoked. He has never been exposed to tobacco smoke. He has never used smokeless tobacco. He reports that he does not drink alcohol and does not use drugs.  ROS: For pertinent review of systems please refer to history  of present illness  Physical Exam: BP 123/72   Pulse 89   Ht '5\' 10"'$  (1.778 m)   Wt 170 lb (77.1 kg)   BMI 24.39 kg/m   Constitutional:  Well nourished. Alert and oriented, No acute  distress. HEENT: [place] AT, moist mucus membranes.  Trachea midline Cardiovascular: No clubbing, cyanosis, or edema. Respiratory: Normal respiratory effort, no increased work of breathing. Neurologic: Grossly intact, no focal deficits, moving all 4 extremities. Psychiatric: Normal mood and affect.    Laboratory Data: Component     Latest Ref Rng 10/24/2021  PSA     < OR = 4.00 ng/mL 2.08    Component     Latest Ref Rng 10/24/2021  Hemoglobin A1C     <5.7 % of total Hgb 5.3   Mean Plasma Glucose     mg/dL 105   eAG (mmol/L)     mmol/L 5.8    Component     Latest Ref Rng 10/24/2021  TSH     0.40 - 4.50 mIU/L 1.57     Component     Latest Ref Rng 10/24/2021  Cholesterol     <200 mg/dL 143   HDL Cholesterol     > OR = 40 mg/dL 58   Triglycerides     <150 mg/dL 52   LDL Cholesterol (Calc)     mg/dL (calc) 72   Total CHOL/HDL Ratio     <5.0 (calc) 2.5   Non-HDL Cholesterol (Calc)     <130 mg/dL (calc) 85    Component     Latest Ref Rng 10/24/2021  Glucose     65 - 99 mg/dL 69   BUN     7 - 25 mg/dL 13   Creatinine     0.70 - 1.35 mg/dL 0.98   BUN/Creatinine Ratio     6 - 22 (calc) SEE NOTE:   Sodium     135 - 146 mmol/L 141   Potassium     3.5 - 5.3 mmol/L 4.8   Chloride     98 - 110 mmol/L 104   CO2     20 - 32 mmol/L 28   Calcium     8.6 - 10.3 mg/dL 9.3   Total Protein     6.1 - 8.1 g/dL 6.6   Albumin MSPROF     3.6 - 5.1 g/dL 4.0   Globulin     1.9 - 3.7 g/dL (calc) 2.6   AG Ratio     1.0 - 2.5 (calc) 1.5   Total Bilirubin     0.2 - 1.2 mg/dL 0.5   Alkaline phosphatase (APISO)     35 - 144 U/L 86   AST     10 - 35 U/L 27   ALT     9 - 46 U/L 28   eGFR     > OR = 60 mL/min/1.52m 85    Component     Latest Ref Rng 10/24/2021  WBC     3.8 - 10.8 Thousand/uL  6.4   RBC     4.20 - 5.80 Million/uL 4.72   Hemoglobin     13.2 - 17.1 g/dL 15.1   HCT     38.5 - 50.0 % 44.7   MCV     80.0 - 100.0 fL 94.7   MCH     27.0 - 33.0 pg 32.0   MCHC     32.0 - 36.0 g/dL 33.8   RDW     11.0 - 15.0 % 11.8   Platelets     140 - 400 Thousand/uL 211   MPV     7.5 - 12.5 fL 11.3   NEUT#     1,500 - 7,800 cells/uL 4,192  Lymphocyte #     850 - 3,900 cells/uL 1,094   Absolute Monocytes     200 - 950 cells/uL 685   Eosinophils Absolute     15 - 500 cells/uL 410   Basophils Absolute     0 - 200 cells/uL 19   Neutrophils     % 65.5   Total Lymphocyte     % 17.1   Monocytes Relative     % 10.7   Eosinophil     % 6.4   Basophil     % 0.3   nRBC     0.0 - 0.2 %   I have reviewed the labs.   Pertinent imaging N/A  Assessment & Plan:    1. BPH with LUTS -PSA stable  -symptoms - frequency and urgency -continue conservative management, avoiding bladder irritants and timed voiding's -will try tadalafil 5 mg daily -RTC in one month for I PSS/PVR   2. ED -at goal with tadalafil   Return for 9mh follow-up.  [place] PClark164 Cemetery StreetSSwayzeeBBeloit [place] [phone]

## 2022-03-23 ENCOUNTER — Ambulatory Visit (INDEPENDENT_AMBULATORY_CARE_PROVIDER_SITE_OTHER): Payer: PPO | Admitting: Urology

## 2022-03-23 ENCOUNTER — Encounter: Payer: Self-pay | Admitting: Urology

## 2022-03-23 VITALS — BP 123/72 | HR 89 | Ht 70.0 in | Wt 170.0 lb

## 2022-03-23 DIAGNOSIS — N138 Other obstructive and reflux uropathy: Secondary | ICD-10-CM

## 2022-03-23 DIAGNOSIS — N401 Enlarged prostate with lower urinary tract symptoms: Secondary | ICD-10-CM | POA: Diagnosis not present

## 2022-03-23 DIAGNOSIS — N529 Male erectile dysfunction, unspecified: Secondary | ICD-10-CM | POA: Diagnosis not present

## 2022-03-23 MED ORDER — TADALAFIL 5 MG PO TABS: 5.0000 mg | ORAL_TABLET | Freq: Every day | ORAL | 3 refills | Status: AC | PRN

## 2022-04-23 ENCOUNTER — Other Ambulatory Visit: Payer: Self-pay | Admitting: Nurse Practitioner

## 2022-04-24 NOTE — Telephone Encounter (Signed)
Requested Prescriptions  Pending Prescriptions Disp Refills   memantine (NAMENDA) 5 MG tablet [Pharmacy Med Name: Memantine HCl 5 MG Oral Tablet] 180 tablet 0    Sig: Take 1 tablet by mouth twice daily     Neurology:  Alzheimer's Agents 2 Passed - 04/23/2022  2:54 PM      Passed - Cr in normal range and within 360 days    Creat  Date Value Ref Range Status  10/24/2021 0.98 0.70 - 1.35 mg/dL Final         Passed - eGFR is 5 or above and within 360 days    GFR, Est African American  Date Value Ref Range Status  04/07/2019 104 > OR = 60 mL/min/1.24m Final   GFR calc Af Amer  Date Value Ref Range Status  11/26/2019 >60 >60 mL/min Final   GFR, Est Non African American  Date Value Ref Range Status  04/07/2019 89 > OR = 60 mL/min/1.784mFinal   GFR calc non Af Amer  Date Value Ref Range Status  11/26/2019 >60 >60 mL/min Final   eGFR  Date Value Ref Range Status  10/24/2021 85 > OR = 60 mL/min/1.7335minal         Passed - Valid encounter within last 6 months    Recent Outpatient Visits           1 month ago Encounter for [employer] wellness exam   [facility]   6 months ago Essential hypertension, benign   [facility]   12 months ago Essential hypertension, benign   [facility]   1 year ago Clavicle enlargement   [facility]   1 year ago Obstructive sleep apnea   [facility]       Future Appointments             Tomorrow McGErnestine ConradhaGordan PaymentnWarrensville Heightsology BurColcordIn 2 days PenReece PackerulMyna HidalgoNPLanier [facility] CenterEC[facility]

## 2022-04-24 NOTE — Progress Notes (Deleted)
04/24/2022  3:18 PM   Eric Rose [birth year] [id number]  Referring provider: Bo Merino, [address] [place],  [place] [address]  Urological history: 1. Elevated PSA -PSA trend -Discontinued the Jalyn in 2019   PSA  Latest Ref Rng < OR = 4.00 ng/mL  10/24/2021 2.08    2  Family history of prostate cancer -father, brother and paternal uncle have been diagnosed with prostate cancer  3. BPH with LU TS -I PSS ***  4. ED -Contributing factors of age, BPH, hypertension, sleep apnea, degenerative joint disease of the lumbar spine, hyperlipidemia and anxiety -SHIM *** -Tadalafil 10 mg, on-demand dosing   HPI: Eric Rose is a 66 y.o. male who presents today for 1 month follow up.       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   Around 2000, damage to left eye (traumatic, playing tennis)    Surgical History: Past Surgical History:  Procedure Laterality Date   JOINT REPLACEMENT Bilateral    hip   ORIF FEMUR FRACTURE Right 11/26/2019   Procedure: OPEN REDUCTION INTERNAL FIXATION (ORIF) proximal FEMUR FRACTURE;  Surgeon: Lovell Sheehan, MD;  Location: [facility] ORS;  Service: Orthopedics;  Laterality: Right;   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   Procedure: TOTAL HIP ARTHROPLASTY;  Surgeon: Earnestine Leys, MD;  Location: [facility] ORS;  Service: Orthopedics;  Laterality: Left;    Home Medications:  Allergies as of 04/25/2022   No Known Allergies      Medication List         Accurate as of April 24, 2022  3:18 PM. If you have any questions, ask your nurse or doctor.          acetaminophen 500 MG tablet Commonly known as: TYLENOL Take 500-1,000 mg by mouth daily.   benzonatate 100 MG capsule Commonly known as: TESSALON Take 2 capsules (200 mg total) by mouth 2 (two) times daily as needed for cough.   donepezil 5 MG tablet Commonly known as: ARICEPT TAKE 1 TABLET BY MOUTH ONCE DAILY WITH SUPPER   lisinopril 20 MG tablet Commonly known as: ZESTRIL Take 1 tablet (20 mg total) by mouth daily.   memantine 5 MG tablet Commonly known as: NAMENDA Take 1 tablet by mouth twice daily   multivitamin capsule Take 1 capsule by mouth daily. 50 +   pravastatin 20 MG tablet Commonly known as: PRAVACHOL Take 1 tablet (20 mg total) by mouth at bedtime.   tadalafil 5 MG tablet Commonly known as: CIALIS Take 1 tablet (5 mg total) by mouth daily as needed for erectile dysfunction.   VITAMIN B-12 PO Take 1 tablet by mouth daily.   VITAMIN D (ERGOCALCIFEROL) PO Take 1,000 mg by mouth daily.   Vitamin E 400 units Tabs Take 400 mg by mouth daily.        Allergies: No Known Allergies  Family History: Family History  Problem Relation Age of Onset   Diabetes Mother    Heart disease Mother    Alzheimer's disease Mother    Prostate cancer Father    Prostate cancer Brother    Prostate cancer Paternal Uncle    Bladder Cancer Neg Hx    Kidney cancer Neg Hx     Social History:  reports that he has never smoked. He has never been exposed to tobacco smoke. He has never used smokeless tobacco. He reports that he does not drink alcohol and does not use drugs.  ROS: For pertinent review of systems please refer to history of present illness  Physical Exam: There were no vitals taken for this visit.  Constitutional:  Well nourished. Alert and oriented, No acute distress. HEENT: [place] AT, moist mucus membranes.  Trachea midline Cardiovascular: No  clubbing, cyanosis, or edema. Respiratory: Normal respiratory effort, no increased work of breathing. GU: No CVA tenderness.  No bladder fullness or masses.  Patient with circumcised/uncircumcised phallus. ***Foreskin easily retracted***  Urethral meatus is patent.  No penile discharge. No penile lesions or rashes. Scrotum without lesions, cysts, rashes and/or edema.  Testicles are located scrotally bilaterally. No masses are appreciated in the testicles. Left and right epididymis are normal. Rectal: Patient with  normal sphincter tone. Anus and perineum without scarring or rashes. No rectal masses are appreciated. Prostate is approximately *** grams, *** nodules are appreciated. Seminal vesicles are normal. Neurologic: Grossly intact, no focal deficits, moving all 4 extremities. Psychiatric: Normal mood and affect.    Laboratory Data: N/A   Pertinent imaging ***  Assessment & Plan:    1. BPH with LUTS -PSA stable  -symptoms - frequency and urgency -continue conservative management, avoiding bladder irritants and timed voiding's -will try tadalafil 5 mg daily -RTC in one month for I PSS/PVR   2. ED -at goal with tadalafil   No follow-ups on file.  [place], [place] [address] [place], [place] [address] [phone]

## 2022-04-25 ENCOUNTER — Ambulatory Visit: Payer: PPO | Admitting: Urology

## 2022-04-25 DIAGNOSIS — N529 Male erectile dysfunction, unspecified: Secondary | ICD-10-CM

## 2022-04-25 DIAGNOSIS — N138 Other obstructive and reflux uropathy: Secondary | ICD-10-CM

## 2022-04-25 NOTE — Progress Notes (Unsigned)
There were no vitals taken for this visit.   Subjective:    Patient ID: Eric Rose, male    DOB: 06/17/1955, 67 y.o.   MRN: Eric Rose:[id number]  HPI: Eric Rose is a 67 y.o. male  No chief complaint on file.  Hypertension: His blood pressure today is ***.  Patient reports his blood pressure normally runs around ***.  Patient currently takes lisinopril 20 mg daily.  Patient reports he does take it every day.  He denies any shortness of breath, chest pain, blurred vision or headaches.   Hyperlipidemia: Currently takes pravastatin 20 mg daily.  He denies any myalgia.  His last LDL was 72 on 10/24/2021.  The 10-year ASCVD risk score (Arnett DK, et al., 2019) is: 14.5%   Values used to calculate the score:     Age: 84 years     Sex: Male     Is Non-Hispanic African American: Yes     Diabetic: No     Tobacco smoker: No     Systolic Blood Pressure: [id number]     Is BP treated: Yes     HDL Cholesterol: 58 mg/dL     Total Cholesterol: 143 mg/dL   OSA on CPAP: She reports that he does use his CPAP machine every night he says he starts the night off wearing it but sometimes during the night he takes it off.   BPH: He is established with urology [name].  Last saw [name] on 04/01/2022. Plan :-PSA stable -symptoms - frequency and urgency-continue conservative management, avoiding bladder irritants and timed voiding's -will try tadalafil 5 mg daily-RTC in one month for I PSS/PVR    ED: taking tadalafil 5 mg daily. He is to follow up with [name] later this month.    Memory impairment: Currently under the care of [name] neurology for memory impairment.  He currently takes Aricept 5 mg daily and Namenda 5 mg twice a day.  He last saw neurology on 08/04/2019.  He has not seen neurology in several years.  Recommend calling and scheduling a follow-up appointment with them.  Weight loss: *** Patient weighed 173 pounds and 12.8 ounces on 04/27/2021 today he weighs 165 pounds 6.4  ounces. That is a loss of 7.52 lbs. He says he is not trying to lose weight.  He is due for colonoscopy in January. He denies any chest pain, abdominal pain, changes in stool or cough.  Discussed increasing protein in diet.  This can include drinking a Boost every day.  Patient denies any recent illness or fever.  Patient and wife report that he eats well. Patient states he did start working again. He works at [employer] three days a week. Discussed with patient and wife that many things can cause weight loss. Will get labs and chest xray.  Patient has not noticed any blood in his stool but is going to pay closer attention at this time.    Relevant past medical, surgical, family and social history reviewed and updated as indicated. Interim medical history since our last visit reviewed. Allergies and medications reviewed and updated.  Review of Systems  Constitutional: Negative for fever, positive weight change.  Respiratory: Negative for cough and shortness of breath.   Cardiovascular: Negative for chest pain or palpitations.  Gastrointestinal: Negative for abdominal pain, no bowel changes.  Musculoskeletal: Negative for gait problem or joint swelling.  Skin: Negative for rash.  Neurological: Negative for dizziness or headache.  No other specific  complaints in a complete review of systems (except as listed in HPI above).      Objective:    There were no vitals taken for this visit.  Wt Readings from Last 3 Encounters:  03/23/22 170 lb (77.1 kg)  03/14/22 170 lb 12.8 oz (77.5 kg)  10/24/21 165 lb 6.4 oz (75 kg)    Physical Exam  Constitutional: Patient appears well-developed and well-nourished. No distress.  HEENT: head atraumatic, normocephalic, pupils equal and reactive to light, neck supple Cardiovascular: Normal rate, regular rhythm and normal heart sounds.  No murmur heard. No BLE edema. Pulmonary/Chest: Effort normal and breath sounds normal. No respiratory distress. Abdominal:  Soft.  There is no tenderness. Psychiatric: Patient has a normal mood and affect. behavior is normal. Judgment and thought content normal.  Results for orders placed or performed in visit on 03/14/22  Novel Coronavirus, NAA ([facility])   Specimen: Nasopharyngeal(NP) swabs in vial transport medium   Nasopharynge  Previous  Result Value Ref Range   SARS-CoV-2, NAA Detected (A) Not Detected  POCT Influenza A/B  Result Value Ref Range   Influenza A, POC Negative Negative   Influenza B, POC Negative Negative      Assessment & Plan:   Problem List Items Addressed This Visit   None    Follow up plan: No follow-ups on file.

## 2022-04-26 ENCOUNTER — Ambulatory Visit (INDEPENDENT_AMBULATORY_CARE_PROVIDER_SITE_OTHER): Payer: PPO | Admitting: Nurse Practitioner

## 2022-04-26 ENCOUNTER — Other Ambulatory Visit: Payer: Self-pay

## 2022-04-26 ENCOUNTER — Encounter: Payer: Self-pay | Admitting: Nurse Practitioner

## 2022-04-26 VITALS — BP 122/84 | HR 86 | Temp 97.8°F | Resp 16 | Ht 71.0 in | Wt 166.8 lb

## 2022-04-26 DIAGNOSIS — E782 Mixed hyperlipidemia: Secondary | ICD-10-CM

## 2022-04-26 DIAGNOSIS — Z1212 Encounter for screening for malignant neoplasm of rectum: Secondary | ICD-10-CM

## 2022-04-26 DIAGNOSIS — R634 Abnormal weight loss: Secondary | ICD-10-CM

## 2022-04-26 DIAGNOSIS — I10 Essential (primary) hypertension: Secondary | ICD-10-CM | POA: Diagnosis not present

## 2022-04-26 DIAGNOSIS — N401 Enlarged prostate with lower urinary tract symptoms: Secondary | ICD-10-CM

## 2022-04-26 DIAGNOSIS — R413 Other amnesia: Secondary | ICD-10-CM

## 2022-04-26 DIAGNOSIS — N529 Male erectile dysfunction, unspecified: Secondary | ICD-10-CM

## 2022-04-26 DIAGNOSIS — G4733 Obstructive sleep apnea (adult) (pediatric): Secondary | ICD-10-CM

## 2022-04-26 DIAGNOSIS — N138 Other obstructive and reflux uropathy: Secondary | ICD-10-CM

## 2022-04-26 DIAGNOSIS — Z1211 Encounter for screening for malignant neoplasm of colon: Secondary | ICD-10-CM

## 2022-04-26 NOTE — Assessment & Plan Note (Signed)
Continue using Cialis as needed.

## 2022-04-26 NOTE — Assessment & Plan Note (Signed)
Try to be more consistent with your CPAP machine and wear it every night.

## 2022-04-26 NOTE — Assessment & Plan Note (Signed)
Still has not been seen by neurology in years.  Provided patient phone number to call to schedule follow-up appointment.  Continue taking Namenda and Aricept.

## 2022-04-26 NOTE — Assessment & Plan Note (Signed)
Continue to take lisinopril 20 mg daily.

## 2022-04-26 NOTE — Assessment & Plan Note (Signed)
Continue taking pravastatin 20 mg daily.

## 2022-04-26 NOTE — Assessment & Plan Note (Signed)
Care provided by [name] PSA stable -symptoms - frequency and urgency-continue conservative management, avoiding bladder irritants and timed voiding's -will try tadalafil 5 mg daily-RTC in one month for I PSS/PVR

## 2022-05-04 LAB — COLOGUARD

## 2022-05-07 ENCOUNTER — Telehealth: Payer: Self-pay | Admitting: Urology

## 2022-05-07 NOTE — Telephone Encounter (Signed)
Please call Mr. Eric Rose and have him reschedule his missed appointment on 02/13 for follow up.

## 2022-05-08 NOTE — Telephone Encounter (Signed)
Unable to leave message, no answer 

## 2022-05-11 NOTE — Telephone Encounter (Signed)
No answer. Unable to leave message.

## 2022-05-15 NOTE — Telephone Encounter (Signed)
Unable to reach patient. 3rd attempt, unable to leave a voice mail.

## 2022-05-24 ENCOUNTER — Ambulatory Visit: Payer: PPO | Admitting: Podiatry

## 2022-05-31 ENCOUNTER — Other Ambulatory Visit: Payer: Self-pay | Admitting: Nurse Practitioner

## 2022-05-31 DIAGNOSIS — I10 Essential (primary) hypertension: Secondary | ICD-10-CM

## 2022-06-01 NOTE — Telephone Encounter (Signed)
Requested Prescriptions  Pending Prescriptions Disp Refills   lisinopril (ZESTRIL) 20 MG tablet [Pharmacy Med Name: Lisinopril 20 MG Oral Tablet] 90 tablet 0    Sig: Take 1 tablet by mouth once daily     Cardiovascular:  ACE Inhibitors Failed - 05/31/2022  9:25 AM      Failed - Cr in normal range and within 180 days    Creat  Date Value Ref Range Status  10/24/2021 0.98 0.70 - 1.35 mg/dL Final         Failed - K in normal range and within 180 days    Potassium  Date Value Ref Range Status  10/24/2021 4.8 3.5 - 5.3 mmol/L Final  12/18/2013 4.2 3.5 - 5.1 mmol/L Final         Passed - Patient is not pregnant      Passed - Last BP in normal range    BP Readings from Last 1 Encounters:  04/26/22 122/84         Passed - Valid encounter within last 6 months    Recent Outpatient Visits           1 month ago Mixed hyperlipidemia   [place], FNP   2 months ago Encounter for [employer] annual wellness exam   [facility] Bo Merino, FNP   7 months ago Essential hypertension, benign   [facility] Bo Merino, FNP   1 year ago Essential hypertension, benign   [facility] Bo Merino, FNP   1 year ago Clavicle enlargement   [facility] Kathrine Haddock, NP

## 2022-06-20 LAB — COLOGUARD: COLOGUARD: NEGATIVE

## 2022-07-27 ENCOUNTER — Other Ambulatory Visit: Payer: Self-pay | Admitting: Nurse Practitioner

## 2022-07-27 DIAGNOSIS — E782 Mixed hyperlipidemia: Secondary | ICD-10-CM

## 2022-07-28 NOTE — Telephone Encounter (Signed)
Requested Prescriptions  Pending Prescriptions Disp Refills   pravastatin (PRAVACHOL) 20 MG tablet [Pharmacy Med Name: Pravastatin Sodium 20 MG Oral Tablet] 90 tablet 0    Sig: TAKE 1 TABLET BY MOUTH AT BEDTIME     Cardiovascular:  Antilipid - Statins Failed - 07/27/2022  7:30 PM      Failed - Lipid Panel in normal range within the last 12 months    Cholesterol, Total  Date Value Ref Range Status  08/06/2015 168 100 - 199 mg/dL Final   Cholesterol  Date Value Ref Range Status  10/24/2021 143 <200 mg/dL Final   LDL Cholesterol (Calc)  Date Value Ref Range Status  10/24/2021 72 mg/dL (calc) Final    Comment:    Reference range: <100 . Desirable range <100 mg/dL for primary prevention;   <70 mg/dL for patients with CHD or diabetic patients  with > or = 2 CHD risk factors. Marland Kitchen LDL-C is now calculated using the Martin-Hopkins  calculation, which is a validated novel method providing  better accuracy than the Friedewald equation in the  estimation of LDL-C.  Horald Pollen et al. Lenox Ahr. 2956;213(08): 2061-2068  (http://education.[facility].com/faq/FAQ164)    HDL  Date Value Ref Range Status  10/24/2021 58 > OR = 40 mg/dL Final  65/78/4696 60 >29 mg/dL Final   Triglycerides  Date Value Ref Range Status  10/24/2021 52 <150 mg/dL Final         Passed - Patient is not pregnant      Passed - Valid encounter within last 12 months    Recent Outpatient Visits           3 months ago Mixed hyperlipidemia   [facility] Berniece Salines, FNP   4 months ago Encounter for [employer] annual wellness exam   [facility] Berniece Salines, FNP   9 months ago Essential hypertension, benign   [facility] Berniece Salines, FNP   1 year ago Essential hypertension, benign   [facility] Berniece Salines, FNP   1 year ago Clavicle enlargement   [facility] Gabriel Cirri, NP

## 2022-08-01 ENCOUNTER — Other Ambulatory Visit: Payer: Self-pay | Admitting: Nurse Practitioner

## 2022-08-01 NOTE — Telephone Encounter (Signed)
Requested Prescriptions  Pending Prescriptions Disp Refills   memantine (NAMENDA) 5 MG tablet [Pharmacy Med Name: Memantine HCl 5 MG Oral Tablet] 180 tablet 0    Sig: Take 1 tablet by mouth twice daily     Neurology:  Alzheimer's Agents 2 Passed - 08/01/2022 11:18 AM      Passed - Cr in normal range and within 360 days    Creat  Date Value Ref Range Status  10/24/2021 0.98 0.70 - 1.35 mg/dL Final         Passed - eGFR is 5 or above and within 360 days    GFR, Est African American  Date Value Ref Range Status  04/07/2019 104 > OR = 60 mL/min/1.30m2 Final   GFR calc Af Amer  Date Value Ref Range Status  11/26/2019 >60 >60 mL/min Final   GFR, Est Non African American  Date Value Ref Range Status  04/07/2019 89 > OR = 60 mL/min/1.50m2 Final   GFR calc non Af Amer  Date Value Ref Range Status  11/26/2019 >60 >60 mL/min Final   eGFR  Date Value Ref Range Status  10/24/2021 85 > OR = 60 mL/min/1.26m2 Final         Passed - Valid encounter within last 6 months    Recent Outpatient Visits           3 months ago Mixed hyperlipidemia   [facility] Berniece Salines, FNP   4 months ago Encounter for [employer] annual wellness exam   [facility] Berniece Salines, FNP   9 months ago Essential hypertension, benign   [facility] Berniece Salines, FNP   1 year ago Essential hypertension, benign   [facility] Berniece Salines, FNP   1 year ago Clavicle enlargement   [facility] Gabriel Cirri, NP

## 2022-09-11 ENCOUNTER — Other Ambulatory Visit: Payer: Self-pay | Admitting: Nurse Practitioner

## 2022-09-11 DIAGNOSIS — I10 Essential (primary) hypertension: Secondary | ICD-10-CM

## 2022-09-12 NOTE — Telephone Encounter (Signed)
Requested Prescriptions  Pending Prescriptions Disp Refills   lisinopril (ZESTRIL) 20 MG tablet [Pharmacy Med Name: Lisinopril 20 MG Oral Tablet] 90 tablet 0    Sig: Take 1 tablet by mouth once daily     Cardiovascular:  ACE Inhibitors Failed - 09/11/2022  1:04 PM      Failed - Cr in normal range and within 180 days    Creat  Date Value Ref Range Status  10/24/2021 0.98 0.70 - 1.35 mg/dL Final         Failed - K in normal range and within 180 days    Potassium  Date Value Ref Range Status  10/24/2021 4.8 3.5 - 5.3 mmol/L Final  12/18/2013 4.2 3.5 - 5.1 mmol/L Final         Passed - Patient is not pregnant      Passed - Last BP in normal range    BP Readings from Last 1 Encounters:  04/26/22 122/84         Passed - Valid encounter within last 6 months    Recent Outpatient Visits           4 months ago Mixed hyperlipidemia   [facility] Berniece Salines, FNP   6 months ago Encounter for [employer] annual wellness exam   [facility] Berniece Salines, FNP   10 months ago Essential hypertension, benign   [facility] Berniece Salines, FNP   1 year ago Essential hypertension, benign   [facility] Berniece Salines, FNP   1 year ago Clavicle enlargement   [facility] Gabriel Cirri, NP

## 2022-10-09 ENCOUNTER — Other Ambulatory Visit: Payer: Self-pay | Admitting: Nurse Practitioner

## 2022-10-09 DIAGNOSIS — I10 Essential (primary) hypertension: Secondary | ICD-10-CM

## 2022-10-10 ENCOUNTER — Other Ambulatory Visit: Payer: Self-pay

## 2022-10-10 NOTE — Telephone Encounter (Signed)
Requested medication (s) are due for refill today: yes  Requested medication (s) are on the active medication list: yes  Last refill:  09/12/22  Future visit scheduled: no  Notes to clinic:  Unable to refill per protocol, courtesy refill already given, routing for provider approval.      Requested Prescriptions  Pending Prescriptions Disp Refills   lisinopril (ZESTRIL) 20 MG tablet [Pharmacy Med Name: Lisinopril 20 MG Oral Tablet] 30 tablet 0    Sig: TAKE 1 TABLET BY MOUTH ONCE DAILY (COURTESY  REFILL  GIVEN,  APPOINTMENT  NEEDED)     Cardiovascular:  ACE Inhibitors Failed - 10/09/2022  6:19 PM      Failed - Cr in normal range and within 180 days    Creat  Date Value Ref Range Status  10/24/2021 0.98 0.70 - 1.35 mg/dL Final         Failed - K in normal range and within 180 days    Potassium  Date Value Ref Range Status  10/24/2021 4.8 3.5 - 5.3 mmol/L Final  12/18/2013 4.2 3.5 - 5.1 mmol/L Final         Passed - Patient is not pregnant      Passed - Last BP in normal range    BP Readings from Last 1 Encounters:  04/26/22 122/84         Passed - Valid encounter within last 6 months    Recent Outpatient Visits           5 months ago Mixed hyperlipidemia   [facility] Berniece Salines, FNP   7 months ago Encounter for [employer] annual wellness exam   [facility]ce Salines, FNP   11 months ago Essential hypertension, benign   [facility] Berniece Salines, FNP   1 year ago Essential hypertension, benign   [facility] Berniece Salines, FNP   1 year ago Clavicle enlargement   [facility] Gabriel Cirri, NP

## 2022-10-21 ENCOUNTER — Other Ambulatory Visit: Payer: Self-pay | Admitting: Nurse Practitioner

## 2022-10-21 DIAGNOSIS — I10 Essential (primary) hypertension: Secondary | ICD-10-CM

## 2022-10-24 NOTE — Telephone Encounter (Signed)
Requested medications are due for refill today.  yes  Requested medications are on the active medications list.  yes  Last refill. 09/12/2022 #30 0 rf  Future visit scheduled.   no  Notes to clinic.  Pt already given a courtesy refill. Labs expired tomorrow.    Requested Prescriptions  Pending Prescriptions Disp Refills   lisinopril (ZESTRIL) 20 MG tablet [Pharmacy Med Name: Lisinopril 20 MG Oral Tablet] 30 tablet 0    Sig: TAKE 1 TABLET BY MOUTH ONCE DAILY (COURTESY  REFILL  GIVEN,  APPOINTMENT  NEEDED)     Cardiovascular:  ACE Inhibitors Failed - 10/21/2022  1:47 PM      Failed - Cr in normal range and within 180 days    Creat  Date Value Ref Range Status  10/24/2021 0.98 0.70 - 1.35 mg/dL Final         Failed - K in normal range and within 180 days    Potassium  Date Value Ref Range Status  10/24/2021 4.8 3.5 - 5.3 mmol/L Final  12/18/2013 4.2 3.5 - 5.1 mmol/L Final         Passed - Patient is not pregnant      Passed - Last BP in normal range    BP Readings from Last 1 Encounters:  04/26/22 122/84         Passed - Valid encounter within last 6 months    Recent Outpatient Visits           6 months ago Mixed hyperlipidemia   [facility] Berniece Salines, FNP   7 months ago Encounter for [employer] annual wellness exam   [facility]ce Salines, FNP   1 year ago Essential hypertension, benign   [facility] Berniece Salines, FNP   1 year ago Essential hypertension, benign   [facility] Berniece Salines, FNP   1 year ago Clavicle enlargement   [facility] Gabriel Cirri, NP

## 2022-10-26 ENCOUNTER — Other Ambulatory Visit: Payer: Self-pay | Admitting: Nurse Practitioner

## 2022-10-26 DIAGNOSIS — E782 Mixed hyperlipidemia: Secondary | ICD-10-CM

## 2022-10-27 NOTE — Telephone Encounter (Signed)
Requested medications are due for refill today.  yes  Requested medications are on the active medications list.  Yes  Last refill. 07/2022  Future visit scheduled.   no  Notes to clinic.  Labs are expired. Pt needs an appt.    Requested Prescriptions  Pending Prescriptions Disp Refills   pravastatin (PRAVACHOL) 20 MG tablet [Pharmacy Med Name: Pravastatin Sodium 20 MG Oral Tablet] 90 tablet 0    Sig: TAKE 1 TABLET BY MOUTH AT BEDTIME     Cardiovascular:  Antilipid - Statins Failed - 10/26/2022  3:16 PM      Failed - Lipid Panel in normal range within the last 12 months    Cholesterol, Total  Date Value Ref Range Status  08/06/2015 168 100 - 199 mg/dL Final   Cholesterol  Date Value Ref Range Status  10/24/2021 143 <200 mg/dL Final   LDL Cholesterol (Calc)  Date Value Ref Range Status  10/24/2021 72 mg/dL (calc) Final    Comment:    Reference range: <100 . Desirable range <100 mg/dL for primary prevention;   <70 mg/dL for patients with CHD or diabetic patients  with > or = 2 CHD risk factors. Marland Kitchen LDL-C is now calculated using the Martin-Hopkins  calculation, which is a validated novel method providing  better accuracy than the Friedewald equation in the  estimation of LDL-C.  Horald Pollen et al. Lenox Ahr. 1610;960(45): 2061-2068  (http://education.[facility].com/faq/FAQ164)    HDL  Date Value Ref Range Status  10/24/2021 58 > OR = 40 mg/dL Final  40/98/1191 60 >47 mg/dL Final   Triglycerides  Date Value Ref Range Status  10/24/2021 52 <150 mg/dL Final         Passed - Patient is not pregnant      Passed - Valid encounter within last 12 months    Recent Outpatient Visits           6 months ago Mixed hyperlipidemia   [facility] Berniece Salines, FNP   7 months ago Encounter for [employer] annual wellness exam   [facility]ce Salines, FNP   1 year ago Essential hypertension, benign   [facility] Berniece Salines, FNP   1 year ago Essential hypertension, benign   [facility] Berniece Salines, FNP   1 year ago Clavicle enlargement   [facility] Gabriel Cirri, NP               memantine [facility]) 5 MG tablet [Pharmacy Med Name: MEMANTINE HCL 5MG    TAB] 180 tablet 0    Sig: Take 1 tablet by mouth twice daily     Neurology:  Alzheimer's Agents 2 Failed - 10/26/2022  3:16 PM      Failed - Cr in normal range and within 360 days    Creat  Date Value Ref Range Status  10/24/2021 0.98 0.70 - 1.35 mg/dL Final         Failed - eGFR is 5 or above and within 360 days    GFR, Est African American  Date Value Ref Range Status  04/07/2019 104 > OR = 60 mL/min/1.49m2 Final   GFR calc Af Amer  Date Value Ref Range Status  11/26/2019 >60 >60 mL/min Final   GFR, Est Non African American  Date Value Ref Range Status  04/07/2019 89 > OR = 60 mL/min/1.5m2 Final   GFR calc non Af Denyse Dago  Date Value Ref Range Status  11/26/2019 >60 >60 mL/min Final   eGFR  Date Value Ref Range Status  10/24/2021 85 > OR = 60 mL/min/1.68m2 Final         Failed - Valid encounter within last 6 months    Recent Outpatient Visits           6 months ago Mixed hyperlipidemia   [facility] Berniece Salines, FNP   7 months ago Encounter for [employer] annual wellness exam   [facility] Berniece Salines, FNP   1 year ago Essential hypertension, benign   [facility] Berniece Salines, FNP   1 year ago Essential hypertension, benign   [facility] Berniece Salines, FNP   1 year ago Clavicle enlargement   [facility] Gabriel Cirri, NP

## 2022-11-25 ENCOUNTER — Other Ambulatory Visit: Payer: Self-pay | Admitting: Nurse Practitioner

## 2022-11-25 DIAGNOSIS — I10 Essential (primary) hypertension: Secondary | ICD-10-CM

## 2022-11-27 NOTE — Telephone Encounter (Signed)
Requested medication (s) are due for refill today: yes  Requested medication (s) are on the active medication list: yes  Last refill:  10/26/22  Future visit scheduled: no  Notes to clinic:  Unable to refill per protocol, courtesy refill already given, routing for provider approval.      Requested Prescriptions  Pending Prescriptions Disp Refills   lisinopril (ZESTRIL) 20 MG tablet [Pharmacy Med Name: Lisinopril 20 MG Oral Tablet] 30 tablet 0    Sig: TAKE 1 TABLET BY MOUTH ONCE DAILY . APPOINTMENT REQUIRED FOR FUTURE REFILLS. COURTESY FILL     Cardiovascular:  ACE Inhibitors Failed - 11/25/2022  3:39 PM      Failed - Cr in normal range and within 180 days    Creat  Date Value Ref Range Status  10/24/2021 0.98 0.70 - 1.35 mg/dL Final         Failed - K in normal range and within 180 days    Potassium  Date Value Ref Range Status  10/24/2021 4.8 3.5 - 5.3 mmol/L Final  12/18/2013 4.2 3.5 - 5.1 mmol/L Final         Failed - Valid encounter within last 6 months    Recent Outpatient Visits           7 months ago Mixed hyperlipidemia   [facility] Berniece Salines, FNP   8 months ago Encounter for [employer] annual wellness exam   [facility] Berniece Salines, FNP   1 year ago Essential hypertension, benign   [facility] Berniece Salines, FNP   1 year ago Essential hypertension, benign   [facility] Berniece Salines, FNP   1 year ago Clavicle enlargement   [facility] Gabriel Cirri, [place]              Passed - Patient is not pregnant      Passed - Last BP in normal range    BP Readings from Last 1 Encounters:  04/26/22 122/84

## 2023-05-07 ENCOUNTER — Other Ambulatory Visit: Payer: Self-pay | Admitting: Nurse Practitioner

## 2023-05-07 DIAGNOSIS — E782 Mixed hyperlipidemia: Secondary | ICD-10-CM

## 2023-05-08 NOTE — Telephone Encounter (Signed)
 Requested medications are due for refill today.  yes  Requested medications are on the active medications list.  yes  Last refill. Both 10/27/2022 6 month supply  Future visit scheduled.   no  Notes to clinic.  Expired labs. Pt is overdue for OV.    Requested Prescriptions  Pending Prescriptions Disp Refills   memantine (NAMENDA) 5 MG tablet [Pharmacy Med Name: Memantine HCl 5 MG Oral Tablet] 180 tablet 0    Sig: Take 1 tablet by mouth twice daily     Neurology:  Alzheimer's Agents 2 Failed - 05/08/2023 11:28 AM      Failed - Cr in normal range and within 360 days    Creat  Date Value Ref Range Status  10/24/2021 0.98 0.70 - 1.35 mg/dL Final         Failed - eGFR is 5 or above and within 360 days    GFR, Est African American  Date Value Ref Range Status  04/07/2019 104 > OR = 60 mL/min/1.49m2 Final   GFR calc Af Amer  Date Value Ref Range Status  11/26/2019 >60 >60 mL/min Final   GFR, Est Non African American  Date Value Ref Range Status  04/07/2019 89 > OR = 60 mL/min/1.72m2 Final   GFR calc non Af Amer  Date Value Ref Range Status  11/26/2019 >60 >60 mL/min Final   eGFR  Date Value Ref Range Status  10/24/2021 85 > OR = 60 mL/min/1.68m2 Final         Failed - Valid encounter within last 6 months    Recent Outpatient Visits           1 year ago Mixed hyperlipidemia   [facility] Berniece Salines, FNP   1 year ago Encounter for [employer] annual wellness exam   [facility] Berniece Salines, FNP   1 year ago Essential hypertension, benign   [facility] Berniece Salines, FNP   2 years ago Essential hypertension, benign   [facility] Berniece Salines, FNP   2 years ago Clavicle enlargement   [facility] Gabriel Cirri, NP               pravastatin (PRAVACHOL) 20 MG tablet [Pharmacy Med Name: Pravastatin Sodium 20 MG Oral  Tablet] 90 tablet 0    Sig: TAKE 1 TABLET BY MOUTH AT BEDTIME     Cardiovascular:  Antilipid - Statins Failed - 05/08/2023 11:28 AM      Failed - Valid encounter within last 12 months    Recent Outpatient Visits           1 year ago Mixed hyperlipidemia   [facility] Berniece Salines, FNP   1 year ago Encounter for [employer] annual wellness exam   [facility] Berniece Salines, FNP   1 year ago Essential hypertension, benign   [facility] Berniece Salines, FNP   2 years ago Essential hypertension, benign   [facility] Berniece Salines, FNP   2 years ago Clavicle enlargement   [facility] Gabriel Cirri, NP              Failed - Lipid Panel in normal range within the last 12 months    Cholesterol, Total  Date Value Ref Range Status  08/06/2015 168 100 - 199 mg/dL Final  Cholesterol  Date Value Ref Range Status  10/24/2021 143 <200 mg/dL Final   LDL Cholesterol (Calc)  Date Value Ref Range Status  10/24/2021 72 mg/dL (calc) Final    Comment:    Reference range: <100 . Desirable range <100 mg/dL for primary prevention;   <70 mg/dL for patients with CHD or diabetic patients  with > or = 2 CHD risk factors. Marland Kitchen LDL-C is now calculated using the Martin-Hopkins  calculation, which is a validated novel method providing  better accuracy than the Friedewald equation in the  estimation of LDL-C.  Horald Pollen et al. Lenox Ahr. 2956;213(08): 2061-2068  (http://education.[facility].com/faq/FAQ164)    HDL  Date Value Ref Range Status  10/24/2021 58 > OR = 40 mg/dL Final  65/78/4696 60 >29 mg/dL Final   Triglycerides  Date Value Ref Range Status  10/24/2021 52 <150 mg/dL Final         Passed - Patient is not pregnant

## 2023-05-08 NOTE — Telephone Encounter (Signed)
 Last f/u 04/26/2022

## 2023-06-17 ENCOUNTER — Other Ambulatory Visit: Payer: Self-pay | Admitting: Nurse Practitioner

## 2023-06-17 DIAGNOSIS — I10 Essential (primary) hypertension: Secondary | ICD-10-CM

## 2023-06-19 NOTE — Telephone Encounter (Signed)
 Requested medications are due for refill today.  yes  Requested medications are on the active medications list.  yes  Last refill. 11/27/2022 #90 1 rf  Future visit scheduled.   no  Notes to clinic.  Labs are expired. Pt needs an ov.    Requested Prescriptions  Pending Prescriptions Disp Refills   lisinopril (ZESTRIL) 20 MG tablet [Pharmacy Med Name: Lisinopril 20 MG Oral Tablet] 90 tablet 0    Sig: TAKE 1 TABLET BY MOUTH ONCE DAILY . APPOINTMENT REQUIRED FOR FUTURE REFILLS     Cardiovascular:  ACE Inhibitors Failed - 06/19/2023 10:17 AM      Failed - Cr in normal range and within 180 days    Creat  Date Value Ref Range Status  10/24/2021 0.98 0.70 - 1.35 mg/dL Final         Failed - K in normal range and within 180 days    Potassium  Date Value Ref Range Status  10/24/2021 4.8 3.5 - 5.3 mmol/L Final  12/18/2013 4.2 3.5 - 5.1 mmol/L Final         Failed - Valid encounter within last 6 months    Recent Outpatient Visits   None            Passed - Patient is not pregnant      Passed - Last BP in normal range    BP Readings from Last 1 Encounters:  04/26/22 122/84

## 2023-07-31 ENCOUNTER — Other Ambulatory Visit: Payer: Self-pay | Admitting: Nurse Practitioner

## 2023-07-31 DIAGNOSIS — E782 Mixed hyperlipidemia: Secondary | ICD-10-CM

## 2023-08-02 NOTE — Telephone Encounter (Signed)
 Requested medications are due for refill today.  yes  Requested medications are on the active medications list.  yes  Last refill. 05/09/2023 #90 0 rf  Future visit scheduled.   yes  Notes to clinic.  Labs are expired.    Requested Prescriptions  Pending Prescriptions Disp Refills   pravastatin  (PRAVACHOL ) 20 MG tablet [Pharmacy Med Name: Pravastatin  Sodium 20 MG Oral Tablet] 90 tablet 0    Sig: TAKE 1 TABLET BY MOUTH AT BEDTIME     Cardiovascular:  Antilipid - Statins Failed - 08/02/2023  8:43 AM      Failed - Valid encounter within last 12 months    Recent Outpatient Visits   None            Failed - Lipid Panel in normal range within the last 12 months    Cholesterol, Total  Date Value Ref Range Status  08/06/2015 168 100 - 199 mg/dL Final   Cholesterol  Date Value Ref Range Status  10/24/2021 143 <200 mg/dL Final   LDL Cholesterol (Calc)  Date Value Ref Range Status  10/24/2021 72 mg/dL (calc) Final    Comment:    Reference range: <100 . Desirable range <100 mg/dL for primary prevention;   <70 mg/dL for patients with CHD or diabetic patients  with > or = 2 CHD risk factors. Aaron Aas LDL-C is now calculated using the Martin-Hopkins  calculation, which is a validated novel method providing  better accuracy than the Friedewald equation in the  estimation of LDL-C.  Melinda Sprawls et al. Erroll Heard. 2130;865(78): 2061-2068  (http://education.[facility].com/faq/FAQ164)    HDL  Date Value Ref Range Status  10/24/2021 58 > OR = 40 mg/dL Final  46/96/2952 60 >84 mg/dL Final   Triglycerides  Date Value Ref Range Status  10/24/2021 52 <150 mg/dL Final         Passed - Patient is not pregnant

## 2023-08-03 ENCOUNTER — Other Ambulatory Visit: Payer: Self-pay | Admitting: Nurse Practitioner

## 2023-08-03 DIAGNOSIS — N529 Male erectile dysfunction, unspecified: Secondary | ICD-10-CM

## 2023-08-07 NOTE — Telephone Encounter (Signed)
 Requested Prescriptions  Refused Prescriptions Disp Refills   tadalafil  (CIALIS ) 10 MG tablet [Pharmacy Med Name: TADALAFIL  10MG       TAB] 20 tablet 0    Sig: TAKE 1 TABLET BY MOUTH 30 MINUTES PRIOR TO PLANNED SEXAUL ACTIVITY     Urology: Erectile Dysfunction Agents Failed - 08/07/2023  2:12 PM      Failed - AST in normal range and within 360 days    AST  Date Value Ref Range Status  10/24/2021 27 10 - 35 U/L Final         Failed - ALT in normal range and within 360 days    ALT  Date Value Ref Range Status  10/24/2021 28 9 - 46 U/L Final         Failed - Valid encounter within last 12 months    Recent Outpatient Visits   None            Passed - Last BP in normal range    BP Readings from Last 1 Encounters:  04/26/22 122/84

## 2023-08-13 ENCOUNTER — Ambulatory Visit: Admitting: Podiatry

## 2023-08-23 ENCOUNTER — Other Ambulatory Visit: Payer: Self-pay | Admitting: Nurse Practitioner

## 2023-08-23 DIAGNOSIS — N529 Male erectile dysfunction, unspecified: Secondary | ICD-10-CM

## 2023-08-24 NOTE — Telephone Encounter (Signed)
 Requested Prescriptions  Refused Prescriptions Disp Refills   tadalafil  (CIALIS ) 10 MG tablet [Pharmacy Med Name: TADALAFIL  10MG       TAB] 20 tablet 0    Sig: TAKE 1 TABLET BY MOUTH 30 MINUTES PRIOR TO PLANNED SEXAUL ACTIVITY     Urology: Erectile Dysfunction Agents Failed - 08/24/2023  4:12 PM      Failed - AST in normal range and within 360 days    AST  Date Value Ref Range Status  10/24/2021 27 10 - 35 U/L Final         Failed - ALT in normal range and within 360 days    ALT  Date Value Ref Range Status  10/24/2021 28 9 - 46 U/L Final         Failed - Valid encounter within last 12 months    Recent Outpatient Visits   None            Passed - Last BP in normal range    BP Readings from Last 1 Encounters:  04/26/22 122/84

## 2023-08-25 ENCOUNTER — Other Ambulatory Visit: Payer: Self-pay | Admitting: Nurse Practitioner

## 2023-08-25 DIAGNOSIS — N529 Male erectile dysfunction, unspecified: Secondary | ICD-10-CM

## 2023-08-27 ENCOUNTER — Ambulatory Visit: Admitting: Podiatry

## 2023-09-03 ENCOUNTER — Telehealth: Payer: Self-pay | Admitting: Nurse Practitioner

## 2023-09-03 NOTE — Telephone Encounter (Signed)
tadalafil (CIALIS) 5 MG tablet

## 2023-09-03 NOTE — Telephone Encounter (Signed)
 Rx denied has yr supply

## 2023-09-10 ENCOUNTER — Other Ambulatory Visit: Payer: Self-pay | Admitting: Nurse Practitioner

## 2023-09-12 ENCOUNTER — Other Ambulatory Visit: Payer: Self-pay | Admitting: Nurse Practitioner

## 2023-09-12 ENCOUNTER — Ambulatory Visit: Admitting: Podiatry

## 2023-09-12 DIAGNOSIS — I10 Essential (primary) hypertension: Secondary | ICD-10-CM

## 2023-09-12 NOTE — Telephone Encounter (Signed)
 Requested medication (s) are due for refill today -yes  Requested medication (s) are on the active medication list -yes  Future visit scheduled -yes  Last refill: 05/09/23 #180  Notes to clinic: fails lab protocol- over 1 year  Requested Prescriptions  Pending Prescriptions Disp Refills   memantine  (NAMENDA ) 5 MG tablet [Pharmacy Med Name: Memantine  HCl 5 MG Oral Tablet] 180 tablet 0    Sig: Take 1 tablet by mouth twice daily     Neurology:  Alzheimer's Agents 2 Failed - 09/12/2023 11:40 AM      Failed - Cr in normal range and within 360 days    Creat  Date Value Ref Range Status  10/24/2021 0.98 0.70 - 1.35 mg/dL Final         Failed - eGFR is 5 or above and within 360 days    GFR, Est African American  Date Value Ref Range Status  04/07/2019 104 > OR = 60 mL/min/1.62m2 Final   GFR calc Af Amer  Date Value Ref Range Status  11/26/2019 >60 >60 mL/min Final   GFR, Est Non African American  Date Value Ref Range Status  04/07/2019 89 > OR = 60 mL/min/1.71m2 Final   GFR calc non Af Amer  Date Value Ref Range Status  11/26/2019 >60 >60 mL/min Final   eGFR  Date Value Ref Range Status  10/24/2021 85 > OR = 60 mL/min/1.42m2 Final         Failed - Valid encounter within last 6 months    Recent Outpatient Visits   None               Requested Prescriptions  Pending Prescriptions Disp Refills   memantine  (NAMENDA ) 5 MG tablet [Pharmacy Med Name: Memantine  HCl 5 MG Oral Tablet] 180 tablet 0    Sig: Take 1 tablet by mouth twice daily     Neurology:  Alzheimer's Agents 2 Failed - 09/12/2023 11:40 AM      Failed - Cr in normal range and within 360 days    Creat  Date Value Ref Range Status  10/24/2021 0.98 0.70 - 1.35 mg/dL Final         Failed - eGFR is 5 or above and within 360 days    GFR, Est African American  Date Value Ref Range Status  04/07/2019 104 > OR = 60 mL/min/1.26m2 Final   GFR calc Af Amer  Date Value Ref Range Status  11/26/2019 >60 >60  mL/min Final   GFR, Est Non African American  Date Value Ref Range Status  04/07/2019 89 > OR = 60 mL/min/1.66m2 Final   GFR calc non Af Amer  Date Value Ref Range Status  11/26/2019 >60 >60 mL/min Final   eGFR  Date Value Ref Range Status  10/24/2021 85 > OR = 60 mL/min/1.56m2 Final         Failed - Valid encounter within last 6 months    Recent Outpatient Visits   None

## 2023-09-14 NOTE — Telephone Encounter (Signed)
 Requested medications are due for refill today.  yes  Requested medications are on the active medications list.  yes  Last refill. 06/19/2023 #90 0 rf  Future visit scheduled.   yes  Notes to clinic.  Labs are expired.    Requested Prescriptions  Pending Prescriptions Disp Refills   lisinopril  (ZESTRIL ) 20 MG tablet [Pharmacy Med Name: Lisinopril  20 MG Oral Tablet] 90 tablet 0    Sig: TAKE 1 TABLET BY MOUTH ONCE DAILY (APPOINTMENT  REQUIRED  FOR  FUTURE  REFILLS)     Cardiovascular:  ACE Inhibitors Failed - 09/14/2023  5:59 PM      Failed - Cr in normal range and within 180 days    Creat  Date Value Ref Range Status  10/24/2021 0.98 0.70 - 1.35 mg/dL Final         Failed - K in normal range and within 180 days    Potassium  Date Value Ref Range Status  10/24/2021 4.8 3.5 - 5.3 mmol/L Final  12/18/2013 4.2 3.5 - 5.1 mmol/L Final         Failed - Valid encounter within last 6 months    Recent Outpatient Visits   None            Passed - Patient is not pregnant      Passed - Last BP in normal range    BP Readings from Last 1 Encounters:  04/26/22 122/84

## 2023-09-23 ENCOUNTER — Other Ambulatory Visit: Payer: Self-pay | Admitting: Nurse Practitioner

## 2023-09-23 ENCOUNTER — Other Ambulatory Visit: Payer: Self-pay | Admitting: Urology

## 2023-09-23 DIAGNOSIS — N529 Male erectile dysfunction, unspecified: Secondary | ICD-10-CM

## 2023-09-25 NOTE — Telephone Encounter (Signed)
 Requested medications are due for refill today.  yes  Requested medications are on the active medications list.  yes  Last refill. 2/26/20252 #180 0 rf  Future visit scheduled.   In October  Notes to clinic.  Labs are expired.    Requested Prescriptions  Pending Prescriptions Disp Refills   memantine  (NAMENDA ) 5 MG tablet [Pharmacy Med Name: Memantine  HCl 5 MG Oral Tablet] 180 tablet 0    Sig: Take 1 tablet by mouth twice daily     Neurology:  Alzheimer's Agents 2 Failed - 09/25/2023  1:41 PM      Failed - Cr in normal range and within 360 days    Creat  Date Value Ref Range Status  10/24/2021 0.98 0.70 - 1.35 mg/dL Final         Failed - eGFR is 5 or above and within 360 days    GFR, Est African American  Date Value Ref Range Status  04/07/2019 104 > OR = 60 mL/min/1.31m2 Final   GFR calc Af Amer  Date Value Ref Range Status  11/26/2019 >60 >60 mL/min Final   GFR, Est Non African American  Date Value Ref Range Status  04/07/2019 89 > OR = 60 mL/min/1.64m2 Final   GFR calc non Af Amer  Date Value Ref Range Status  11/26/2019 >60 >60 mL/min Final   eGFR  Date Value Ref Range Status  10/24/2021 85 > OR = 60 mL/min/1.7m2 Final         Failed - Valid encounter within last 6 months    Recent Outpatient Visits   None

## 2023-09-26 ENCOUNTER — Ambulatory Visit: Admitting: Podiatry

## 2023-09-26 DIAGNOSIS — D2372 Other benign neoplasm of skin of left lower limb, including hip: Secondary | ICD-10-CM

## 2023-09-26 DIAGNOSIS — M79676 Pain in unspecified toe(s): Secondary | ICD-10-CM

## 2023-09-26 DIAGNOSIS — D2371 Other benign neoplasm of skin of right lower limb, including hip: Secondary | ICD-10-CM | POA: Diagnosis not present

## 2023-09-26 DIAGNOSIS — B351 Tinea unguium: Secondary | ICD-10-CM | POA: Diagnosis not present

## 2023-09-26 NOTE — Progress Notes (Signed)
 He presents today after having not seen him for a couple of years with a chief concern of painful lesion subfifth metatarsal head of the left foot and a painful lesion to the plantar central right heel.  He also has thick dystrophic painful mycotic nails.  Objective: Vital signs are stable oriented x 3.  Toenails are long thick yellow dystrophic clinically mycotic multiple benign skin lesions.  Assessment: Benign skin lesions bilateral and painful mycotic nails.  Plan: Debridement of benign skin lesions bilaterally and debridement of nails 1 through 5 bilateral.

## 2023-09-27 NOTE — Addendum Note (Signed)
 Addended by: ELAYNE ROSINA BRAVO on: 09/27/2023 07:43 PM   Modules accepted: Level of Service

## 2023-10-29 ENCOUNTER — Other Ambulatory Visit: Payer: Self-pay | Admitting: Nurse Practitioner

## 2023-10-29 DIAGNOSIS — E782 Mixed hyperlipidemia: Secondary | ICD-10-CM

## 2023-10-31 NOTE — Telephone Encounter (Signed)
 Requested Prescriptions  Pending Prescriptions Disp Refills   pravastatin  (PRAVACHOL ) 20 MG tablet [Pharmacy Med Name: PRAVASTATIN  20MG     TAB] 90 tablet 0    Sig: TAKE 1 TABLET BY MOUTH AT BEDTIME     Cardiovascular:  Antilipid - Statins Failed - 10/31/2023  2:22 PM      Failed - Valid encounter within last 12 months    Recent Outpatient Visits   None            Failed - Lipid Panel in normal range within the last 12 months    Cholesterol, Total  Date Value Ref Range Status  08/06/2015 168 100 - 199 mg/dL Final   Cholesterol  Date Value Ref Range Status  10/24/2021 143 <200 mg/dL Final   LDL Cholesterol (Calc)  Date Value Ref Range Status  10/24/2021 72 mg/dL (calc) Final    Comment:    Reference range: <100 . Desirable range <100 mg/dL for primary prevention;   <70 mg/dL for patients with CHD or diabetic patients  with > or = 2 CHD risk factors. SABRA LDL-C is now calculated using the Martin-Hopkins  calculation, which is a validated novel method providing  better accuracy than the Friedewald equation in the  estimation of LDL-C.  Gladis APPLETHWAITE et al. SANDREA. 7986;689(80): 2061-2068  (http://education.[facility].com/faq/FAQ164)    HDL  Date Value Ref Range Status  10/24/2021 58 > OR = 40 mg/dL Final  94/73/7982 60 >60 mg/dL Final   Triglycerides  Date Value Ref Range Status  10/24/2021 52 <150 mg/dL Final         Passed - Patient is not pregnant

## 2023-11-01 ENCOUNTER — Other Ambulatory Visit: Payer: Self-pay | Admitting: Nurse Practitioner

## 2023-11-01 DIAGNOSIS — E782 Mixed hyperlipidemia: Secondary | ICD-10-CM

## 2023-11-02 ENCOUNTER — Other Ambulatory Visit: Payer: Self-pay | Admitting: Nurse Practitioner

## 2023-11-02 NOTE — Telephone Encounter (Signed)
 Requested Prescriptions  Refused Prescriptions Disp Refills   pravastatin  (PRAVACHOL ) 20 MG tablet [Pharmacy Med Name: PRAVASTATIN  20MG     TAB] 90 tablet 0    Sig: TAKE 1 TABLET BY MOUTH AT BEDTIME     Cardiovascular:  Antilipid - Statins Failed - 11/02/2023  2:38 PM      Failed - Valid encounter within last 12 months    Recent Outpatient Visits   None            Failed - Lipid Panel in normal range within the last 12 months    Cholesterol, Total  Date Value Ref Range Status  08/06/2015 168 100 - 199 mg/dL Final   Cholesterol  Date Value Ref Range Status  10/24/2021 143 <200 mg/dL Final   LDL Cholesterol (Calc)  Date Value Ref Range Status  10/24/2021 72 mg/dL (calc) Final    Comment:    Reference range: <100 . Desirable range <100 mg/dL for primary prevention;   <70 mg/dL for patients with CHD or diabetic patients  with > or = 2 CHD risk factors. SABRA LDL-C is now calculated using the Martin-Hopkins  calculation, which is a validated novel method providing  better accuracy than the Friedewald equation in the  estimation of LDL-C.  Gladis APPLETHWAITE et al. SANDREA. 7986;689(80): 2061-2068  (http://education.[facility].com/faq/FAQ164)    HDL  Date Value Ref Range Status  10/24/2021 58 > OR = 40 mg/dL Final  94/73/7982 60 >60 mg/dL Final   Triglycerides  Date Value Ref Range Status  10/24/2021 52 <150 mg/dL Final         Passed - Patient is not pregnant

## 2023-11-05 NOTE — Telephone Encounter (Signed)
 Requested Prescriptions  Pending Prescriptions Disp Refills   memantine  (NAMENDA ) 5 MG tablet [Pharmacy Med Name: MEMANTINE  HCL 5MG    TAB] 180 tablet 0    Sig: Take 1 tablet by mouth twice daily     Neurology:  Alzheimer's Agents 2 Failed - 11/05/2023 12:15 PM      Failed - Cr in normal range and within 360 days    Creat  Date Value Ref Range Status  10/24/2021 0.98 0.70 - 1.35 mg/dL Final         Failed - eGFR is 5 or above and within 360 days    GFR, Est African American  Date Value Ref Range Status  04/07/2019 104 > OR = 60 mL/min/1.87m2 Final   GFR calc Af Amer  Date Value Ref Range Status  11/26/2019 >60 >60 mL/min Final   GFR, Est Non African American  Date Value Ref Range Status  04/07/2019 89 > OR = 60 mL/min/1.37m2 Final   GFR calc non Af Amer  Date Value Ref Range Status  11/26/2019 >60 >60 mL/min Final   eGFR  Date Value Ref Range Status  10/24/2021 85 > OR = 60 mL/min/1.24m2 Final         Failed - Valid encounter within last 6 months    Recent Outpatient Visits   None

## 2023-12-19 NOTE — Progress Notes (Unsigned)
 Name: Eric Rose   MRN: [id number]    DOB: [birth year]   Date:12/19/2023       Progress Note  Subjective  Chief Complaint  Annual Wellness Exam    HPI  Patient presents for annual CPE .    Diet: *** Exercise: *** Sleep: *** Last dental exam:*** Last eye exam: ***  Depression: phq 9 is {gen pos neg:315643}    04/26/2022   11:00 AM 03/14/2022    2:12 PM 10/24/2021   11:02 AM 03/10/2021    2:03 PM 01/06/2021    1:17 PM  Depression screen PHQ 2/9  Decreased Interest 0 0 0 0 0  Down, Depressed, Hopeless 0 0 0 0 0  PHQ - 2 Score 0 0 0 0 0  Altered sleeping     0  Tired, decreased energy     0  Change in appetite     0  Feeling bad or failure about yourself      0  Trouble concentrating     0  Moving slowly or fidgety/restless     0  Suicidal thoughts     0  PHQ-9 Score     0    Hypertension:  BP Readings from Last 3 Encounters:  04/26/22 122/84  03/23/22 123/72  03/14/22 118/74    Obesity: Wt Readings from Last 3 Encounters:  04/26/22 166 lb 12.8 oz (75.7 kg)  03/23/22 170 lb (77.1 kg)  03/14/22 170 lb 12.8 oz (77.5 kg)   BMI Readings from Last 3 Encounters:  04/26/22 23.26 kg/m  03/23/22 24.39 kg/m  03/14/22 23.82 kg/m     Lipids:  Lab Results  Component Value Date   CHOL 143 10/24/2021   CHOL 143 04/27/2021   CHOL 140 10/02/2018   Lab Results  Component Value Date   HDL 58 10/24/2021   HDL 54 04/27/2021   HDL 48 10/02/2018   Lab Results  Component Value Date   LDLCALC 72 10/24/2021   LDLCALC 76 04/27/2021   LDLCALC 78 10/02/2018   Lab Results  Component Value Date   TRIG 52 10/24/2021   TRIG 45 04/27/2021   TRIG 55 10/02/2018   Lab Results  Component Value Date   CHOLHDL 2.5 10/24/2021   CHOLHDL 2.6 04/27/2021   CHOLHDL 2.9 10/02/2018   No results found for: LDLDIRECT Glucose:  Glucose  Date Value Ref Range Status  12/18/2013 94 65 - 99 mg/dL Final  90/78/7984 89 65 - 99 mg/dL Final   Glucose, Bld  Date Value Ref  Range Status  10/24/2021 69 65 - 99 mg/dL Final    Comment:    .            Fasting reference interval .   04/27/2021 85 65 - 99 mg/dL Final    Comment:    .            Fasting reference interval .   10/27/2020 61 (L) 65 - 99 mg/dL Final    Comment:    .            Fasting reference interval .     Flowsheet Row Office Visit from [facility]/26/20[facility] in [facility]  AUDIT-C Score 0   ***  Married STD testing and prevention (HIV/chl/gon/syphilis): Completed 10/24/2021 Hep C: Completed 10/24/2021  Skin cancer: Discussed monitoring for atypical lesions Colorectal cancer: Completed  Prostate cancer: checked in 2023 (normal). Will recheck Lab Results  Component Value Date   PSA 2.08  10/24/2021     Lung cancer:  *** Low Dose CT Chest recommended if Age 71-80 years, 30 pack-year currently smoking OR have quit w/in 15years. Patient {DOES NOT does:27190::does not} qualify.   AAA: *** The USPSTF recommends one-time screening with ultrasonography in men ages 87 to 62 years who have ever smoked ECG:  03/31/2015  Vaccines:  HPV: up to at age 31 , ask insurance if age between 61-45  Shingrix: 79-64 yo and ask insurance if covered when patient above 60 yo Pneumonia:  educated and discussed with patient. Flu:  educated and discussed with patient.  Advanced Care Planning: A voluntary discussion about advance care planning including the explanation and discussion of advance directives.  Discussed health care proxy and Living will, and the patient was able to identify a health care proxy as ***.  Patient {DOES_DOES WNU:81435} have a living will at present time. If patient does have living will, I have requested they bring this to the clinic to be scanned in to their chart.  Patient Active Problem List   Diagnosis Date Noted   Periprosthetic fracture around internal prosthetic right hip joint (HCC) 11/26/2019   Degeneration of lumbar intervertebral disc 10/02/2018    Obstructive sleep apnea 04/04/2018   Mixed hyperlipidemia 04/04/2018   Lump in neck 02/21/2017   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   Memory impairment 02/07/2016   Erectile dysfunction 08/08/2015   Anemia 08/06/2015   Essential hypertension, benign 08/06/2015   S/P total hip arthroplasty 04/14/2015   Elevated PSA 01/12/2015   BPH with obstruction/lower urinary tract symptoms 01/12/2015    Past Surgical History:  Procedure Laterality Date   JOINT REPLACEMENT Bilateral    hip   ORIF FEMUR FRACTURE Right 11/26/2019   Procedure: OPEN REDUCTION INTERNAL FIXATION (ORIF) proximal FEMUR FRACTURE;  Surgeon: Leora Lynwood SAUNDERS, MD;  Location: [facility] ORS;  Service: Orthopedics;  Laterality: Right;   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   Procedure: TOTAL HIP ARTHROPLASTY;  Surgeon: Kayla Pinal, MD;  Location: [facility] ORS;  Service: Orthopedics;  Laterality: Left;    Family History  Problem Relation Age of Onset   Diabetes Mother    Heart disease Mother    Alzheimer's disease Mother    Prostate cancer Father    Prostate cancer Brother    Prostate cancer Paternal Uncle    Bladder Cancer Neg Hx    Kidney cancer Neg Hx     Social History   Socioeconomic History   Marital status: Married    Spouse name: Shanda   Number of children: 3   Years of education: 12   Highest education level: Some college, no degree  Occupational History   Occupation: retired  Tobacco Use   Smoking status: Never    Passive exposure: Never   Smokeless tobacco: Never  Vaping Use   Vaping status: Never Used  Substance and Sexual Activity   Alcohol use: No    Alcohol/week: 0.0 standard drinks of alcohol   Drug use: No   Sexual activity: Yes  Other Topics Concern   Not on file  Social History Narrative   Retired from [employer] 12/2018   Social Drivers of Health   Financial Resource Strain: Low Risk  (03/14/2022)   Overall Financial Resource Strain (CARDIA)    Difficulty of Paying Living Expenses: Not hard at  all  Food Insecurity: No Food Insecurity (03/14/2022)   Hunger Vital Sign    Worried About Running Out of Food in the Last Year: Never true  Ran Out of Food in the Last Year: Never true  Transportation Needs: No Transportation Needs (03/14/2022)   PRAPARE - Administrator, Civil Service (Medical): No    Lack of Transportation (Non-Medical): No  Physical Activity: Inactive (03/14/2022)   Exercise Vital Sign    Days of Exercise per Week: 0 days    Minutes of Exercise per Session: 0 min  Stress: No Stress Concern Present (03/14/2022)   [employer] of Occupational Health - Occupational Stress Questionnaire    Feeling of Stress : Not at all  Social Connections: Socially Integrated (03/14/2022)   Social Connection and Isolation Panel    Frequency of Communication with Friends and Family: Twice a week    Frequency of Social Gatherings with Friends and Family: Twice a week    Attends Religious Services: More than 4 times per year    Active Member of [employer] or Organizations: Yes    Attends Engineer, structural: More than 4 times per year    Marital Status: Married  Catering manager Violence: Not At Risk (03/14/2022)   Humiliation, Afraid, Rape, and Kick questionnaire    Fear of Current or Ex-Partner: No    Emotionally Abused: No    Physically Abused: No    Sexually Abused: No     Current Outpatient Medications:    acetaminophen  (TYLENOL ) 500 MG tablet, Take 500-1,000 mg by mouth daily., Disp: , Rfl:    Cyanocobalamin (VITAMIN B-12 PO), Take 1 tablet by mouth daily., Disp: , Rfl:    donepezil  (ARICEPT ) 5 MG tablet, TAKE 1 TABLET BY MOUTH ONCE DAILY WITH SUPPER, Disp: 90 tablet, Rfl: 1   lisinopril  (ZESTRIL ) 20 MG tablet, TAKE 1 TABLET BY MOUTH ONCE DAILY (APPOINTMENT  REQUIRED  FOR  FUTURE  REFILLS), Disp: 90 tablet, Rfl: 0   memantine  (NAMENDA ) 5 MG tablet, Take 1 tablet by mouth twice daily, Disp: 180 tablet, Rfl: 0   Multiple Vitamin (MULTIVITAMIN) capsule, Take 1  capsule by mouth daily. 50 +, Disp: , Rfl:    pravastatin  (PRAVACHOL ) 20 MG tablet, TAKE 1 TABLET BY MOUTH AT BEDTIME, Disp: 90 tablet, Rfl: 0   tadalafil  (CIALIS ) 5 MG tablet, Take 1 tablet (5 mg total) by mouth daily as needed for erectile dysfunction., Disp: 90 tablet, Rfl: 3   VITAMIN D , ERGOCALCIFEROL , PO, Take 1,000 mg by mouth daily. , Disp: , Rfl:    Vitamin E 400 units TABS, Take 400 mg by mouth daily. , Disp: , Rfl:   No Known Allergies   ROS  Constitutional: Negative for fever or weight change.  Respiratory: Negative for cough and shortness of breath.   Cardiovascular: Negative for chest pain or palpitations.  Gastrointestinal: Negative for abdominal pain, no bowel changes.  Musculoskeletal: Negative for gait problem or joint swelling.  Skin: Negative for rash.  Neurological: Negative for dizziness or headache.  No other specific complaints in a complete review of systems (except as listed in HPI above).    Objective  There were no vitals filed for this visit.  There is no height or weight on file to calculate BMI.  Physical Exam ***  No results found for this or any previous visit (from the past 2160 hours).   Fall Risk:    04/26/2022   11:00 AM 03/14/2022    2:12 PM 10/24/2021   11:02 AM 04/27/2021   10:11 AM 03/10/2021    2:06 PM  Fall Risk   Falls in the past year? 0 0 0  0 0  Number falls in past yr: 0 0 0 0 0  Injury with Fall? 0 0 0 0 0  Risk for fall due to :     No Fall Risks  Follow up Falls evaluation completed Falls evaluation completed  Falls evaluation completed  Falls evaluation completed  Falls prevention discussed      Data saved with a previous flowsheet row definition   ***   Functional Status Survey:   ***   Assessment & Plan  There are no diagnoses linked to this encounter.   -Prostate cancer screening and PSA options (with potential risks and benefits of testing vs not testing) were discussed along with recent  recs/guidelines. -USPSTF grade A and B recommendations reviewed with patient; age-appropriate recommendations, preventive care, screening tests, etc discussed and encouraged; healthy living encouraged; see AVS for patient education given to patient -Discussed importance of 150 minutes of physical activity weekly, eat two servings of fish weekly, eat one serving of tree nuts ( cashews, pistachios, pecans, almonds.SABRA) every other day, eat 6 servings of fruit/vegetables daily and drink plenty of water and avoid sweet beverages.  -Reviewed Health Maintenance: YES

## 2023-12-20 ENCOUNTER — Encounter: Payer: Self-pay | Admitting: Nurse Practitioner

## 2023-12-20 ENCOUNTER — Ambulatory Visit (INDEPENDENT_AMBULATORY_CARE_PROVIDER_SITE_OTHER): Payer: PPO | Admitting: Nurse Practitioner

## 2023-12-20 ENCOUNTER — Ambulatory Visit: Payer: PPO

## 2023-12-20 VITALS — BP 122/70 | HR 68 | Resp 16 | Ht 71.0 in | Wt 165.0 lb

## 2023-12-20 DIAGNOSIS — N401 Enlarged prostate with lower urinary tract symptoms: Secondary | ICD-10-CM | POA: Diagnosis not present

## 2023-12-20 DIAGNOSIS — E782 Mixed hyperlipidemia: Secondary | ICD-10-CM | POA: Diagnosis not present

## 2023-12-20 DIAGNOSIS — D649 Anemia, unspecified: Secondary | ICD-10-CM

## 2023-12-20 DIAGNOSIS — N529 Male erectile dysfunction, unspecified: Secondary | ICD-10-CM

## 2023-12-20 DIAGNOSIS — Z23 Encounter for immunization: Secondary | ICD-10-CM

## 2023-12-20 DIAGNOSIS — I10 Essential (primary) hypertension: Secondary | ICD-10-CM | POA: Diagnosis not present

## 2023-12-20 DIAGNOSIS — R972 Elevated prostate specific antigen [PSA]: Secondary | ICD-10-CM

## 2023-12-20 DIAGNOSIS — Z131 Encounter for screening for diabetes mellitus: Secondary | ICD-10-CM

## 2023-12-20 DIAGNOSIS — R413 Other amnesia: Secondary | ICD-10-CM

## 2023-12-20 DIAGNOSIS — Z0000 Encounter for general adult medical examination without abnormal findings: Secondary | ICD-10-CM

## 2023-12-20 DIAGNOSIS — N138 Other obstructive and reflux uropathy: Secondary | ICD-10-CM

## 2023-12-20 DIAGNOSIS — G4733 Obstructive sleep apnea (adult) (pediatric): Secondary | ICD-10-CM

## 2023-12-20 DIAGNOSIS — Z130 Encounter for screening for diseases of the blood and blood-forming organs and certain disorders involving the immune mechanism: Secondary | ICD-10-CM

## 2023-12-20 NOTE — Assessment & Plan Note (Signed)
 Continue taking Memantine  5mg  and donepezil  5 mg. Follow-up with Neurology.

## 2023-12-20 NOTE — Assessment & Plan Note (Addendum)
 Checking CBC today. He is not currently taking any iron supplementation.

## 2023-12-20 NOTE — Assessment & Plan Note (Signed)
 Not currently having any problems with his sleep apnea. No recent changes in breathing.

## 2023-12-20 NOTE — Assessment & Plan Note (Signed)
Follow-up with Urology as needed

## 2023-12-20 NOTE — Assessment & Plan Note (Signed)
 Continue taking Lisinopril  20 mg daily. Continue working on reducing salt intake. BP today was 122/70.

## 2023-12-20 NOTE — Assessment & Plan Note (Signed)
Checking PSA today.

## 2023-12-20 NOTE — Assessment & Plan Note (Signed)
 Continue taking Pravastatin  20 mg. Continue working on lifestyle modifications, such as reducing saturated fats in diet. Lipid panel being checked today.

## 2023-12-20 NOTE — Progress Notes (Signed)
 BP 122/70   Pulse 68   Resp 16   Ht 5' 11 (1.803 m)   Wt 165 lb (74.8 kg)   SpO2 99%   BMI 23.01 kg/m    Subjective:    Patient ID: Eric Rose, male    DOB: 21-Aug-1955, 68 y.o.   MRN: [id number]  Chief Complaint  Patient presents with   Medical Management of Chronic Issues    HPI: Eric Rose is a 68 y.o. with a history of memory impairment, hypertension, hyperlipidemia, sleep apnea, BPH, and erectile dysfunction presenting today with his wife for chronic medical management. Patient has not been evaluated in our office since 04/26/2022. For memory impairment he is followed by Neurology and has been diagnosed with dementia. He is currently taking Memantine  5 mg daily and donepezil  5 mg daily. He follows up with Neurology once a year. For his hypertension he is taking lisinopril  20 mg. He is not checking his blood pressure at home. BP today is 122/70. For hyperlipidemia he is taking pravastatin  20 mg daily and tolerating it well. He is not taking tadalafil  for erectile dysfunction and is not currently seen by Urology but has been evaluated in the past. He is not currently having any urinary problems related to his BPH. He is not having any problems related to his sleep apnea. He is eating a well balanced diet incorporating fruits, vegetables and protein and working on staying active weekly by walking.       12/20/2023   10:47 AM 04/26/2022   11:00 AM 03/14/2022    2:12 PM  Depression screen PHQ 2/9  Decreased Interest 0 0 0  Down, Depressed, Hopeless 0 0 0  PHQ - 2 Score 0 0 0    Relevant past medical, surgical, family and social history reviewed and updated as indicated. Interim medical history since our last visit reviewed. Allergies and medications reviewed and updated.  Review of Systems  Constitutional: Negative for fever or weight change.  Respiratory: Negative for cough and shortness of breath.   Cardiovascular: Negative for chest pain or palpitations.   Gastrointestinal: Negative for abdominal pain, no bowel changes.  Musculoskeletal: Negative for gait problem or joint swelling.  Skin: Negative for rash.  Neurological: Negative for dizziness or headache.  No other specific complaints in a complete review of systems (except as listed in HPI above).     Objective:     BP 122/70   Pulse 68   Resp 16   Ht 5' 11 (1.803 m)   Wt 165 lb (74.8 kg)   SpO2 99%   BMI 23.01 kg/m    Wt Readings from Last 3 Encounters:  12/20/23 165 lb (74.8 kg)  04/26/22 166 lb 12.8 oz (75.7 kg)  03/23/22 170 lb (77.1 kg)    Physical Exam Constitutional:      Appearance: Normal appearance.  HENT:     Head: Normocephalic and atraumatic.  Eyes:     Pupils: Pupils are equal, round, and reactive to light.  Cardiovascular:     Rate and Rhythm: Normal rate and regular rhythm.     Pulses: Normal pulses.     Heart sounds: Normal heart sounds.  Pulmonary:     Effort: Pulmonary effort is normal.     Breath sounds: Normal breath sounds.  Musculoskeletal:        General: Normal range of motion.     Cervical back: Normal range of motion.  Skin:    General: Skin is warm  and dry.  Neurological:     General: No focal deficit present.     Mental Status: He is alert. Mental status is at baseline.  Psychiatric:        Mood and Affect: Mood normal.        Behavior: Behavior normal.        Thought Content: Thought content normal.        Judgment: Judgment normal.      Results for orders placed or performed in visit on 04/26/22  Cologuard   Collection Time: 05/04/22  3:31 AM  Result Value Ref Range   COLOGUARD Sample Could Not Be Processed 9 N/A  Cologuard   Collection Time: 06/13/22 11:11 PM  Result Value Ref Range   COLOGUARD Negative Negative          Assessment & Plan:   Problem List Items Addressed This Visit       Cardiovascular and Mediastinum   Essential hypertension, benign (Chronic)   Continue taking Lisinopril  20 mg daily.  Continue working on reducing salt intake. BP today was 122/70.         Respiratory   Obstructive sleep apnea (Chronic)   Not currently having any problems with his sleep apnea. No recent changes in breathing.         Genitourinary   BPH with obstruction/lower urinary tract symptoms   Not currently having any issues with his BPH. Follow-up with Urology as needed.       Relevant Orders   PSA     Other   Elevated PSA   Checking PSA today.       Anemia   Checking CBC today. He is not currently taking any iron supplementation.       Erectile dysfunction   Follow-up with Urology as needed.       Memory impairment   Continue taking Memantine  5mg  and donepezil  5 mg. Follow-up with Neurology.       Mixed hyperlipidemia - Primary   Continue taking Pravastatin  20 mg. Continue working on lifestyle modifications, such as reducing saturated fats in diet. Lipid panel being checked today.       Relevant Orders   Comprehensive metabolic panel with GFR   Lipid panel   Other Visit Diagnoses       Immunization due       Relevant Orders   Flu vaccine HIGH DOSE PF(Fluzone Trivalent) (Completed)   Pneumococcal conjugate vaccine 20-valent (Prevnar 20) (Completed)     Screening for iron deficiency anemia       Relevant Orders   CBC with Differential/Platelet     Screening for diabetes mellitus       Relevant Orders   Hemoglobin A1c        Assessment and Plan -Continue to follow-up with Neurology  -Continue current medication regimen -Lab work done today. We will notify patient of results -Flu vaccine and Pneumococcal vaccine administered today.     Follow up plan: Return in about 6 months (around 06/19/2024).    I have reviewed this encounter including the documentation in this note and/or discussed this patient with the provider, Aislinn Womack, SNP, I am certifying that I agree with the content of this note as supervising/preceptor nurse practitioner.  Mliss Spray,  FNP-C [facility] 12/20/2023, 11:53 AM

## 2023-12-20 NOTE — Assessment & Plan Note (Signed)
 Not currently having any issues with his BPH. Follow-up with Urology as needed.

## 2023-12-21 ENCOUNTER — Ambulatory Visit: Payer: Self-pay | Admitting: Nurse Practitioner

## 2023-12-21 LAB — COMPREHENSIVE METABOLIC PANEL WITH GFR
AG Ratio: 1.6 (calc) (ref 1.0–2.5)
ALT: 33 U/L (ref 9–46)
AST: 32 U/L (ref 10–35)
Albumin: 4.2 g/dL (ref 3.6–5.1)
Alkaline phosphatase (APISO): 98 U/L (ref 35–144)
BUN: 10 mg/dL (ref 7–25)
CO2: 32 mmol/L (ref 20–32)
Calcium: 9.4 mg/dL (ref 8.6–10.3)
Chloride: 102 mmol/L (ref 98–110)
Creat: 0.91 mg/dL (ref 0.70–1.35)
Globulin: 2.6 g/dL (ref 1.9–3.7)
Glucose, Bld: 56 mg/dL — ABNORMAL LOW (ref 65–99)
Potassium: 5 mmol/L (ref 3.5–5.3)
Sodium: 140 mmol/L (ref 135–146)
Total Bilirubin: 0.6 mg/dL (ref 0.2–1.2)
Total Protein: 6.8 g/dL (ref 6.1–8.1)
eGFR: 91 mL/min/1.73m2 (ref >=60)

## 2023-12-21 LAB — CBC WITH DIFFERENTIAL/PLATELET
Absolute Lymphocytes: 1177 {cells}/uL (ref 850–3900)
Absolute Monocytes: 611 {cells}/uL (ref 200–950)
Basophils Absolute: 33 {cells}/uL (ref 0–200)
Basophils Relative: 0.5 %
Eosinophils Absolute: 273 {cells}/uL (ref 15–500)
Eosinophils Relative: 4.2 %
HCT: 47.1 % (ref 38.5–50.0)
Hemoglobin: 15.6 g/dL (ref 13.2–17.1)
MCH: 31.9 pg (ref 27.0–33.0)
MCHC: 33.1 g/dL (ref 32.0–36.0)
MCV: 96.3 fL (ref 80.0–100.0)
MPV: 12.3 fL (ref 7.5–12.5)
Monocytes Relative: 9.4 %
Neutro Abs: 4407 {cells}/uL (ref 1500–7800)
Neutrophils Relative %: 67.8 %
Platelets: 220 Thousand/uL (ref 140–400)
RBC: 4.89 Million/uL (ref 4.20–5.80)
RDW: 11.9 % (ref 11.0–15.0)
Total Lymphocyte: 18.1 %
WBC: 6.5 Thousand/uL (ref 3.8–10.8)

## 2023-12-21 LAB — HEMOGLOBIN A1C
Hgb A1c MFr Bld: 5.5 % (ref <5.7)
Mean Plasma Glucose: 111 mg/dL
eAG (mmol/L): 6.2 mmol/L

## 2023-12-21 LAB — LIPID PANEL
Cholesterol: 140 mg/dL (ref <200)
HDL: 56 mg/dL (ref >=40)
LDL Cholesterol (Calc): 70 mg/dL
Non-HDL Cholesterol (Calc): 84 mg/dL (ref <130)
Total CHOL/HDL Ratio: 2.5 (calc) (ref <5.0)
Triglycerides: 59 mg/dL (ref <150)

## 2023-12-21 LAB — PSA: PSA: 2.9 ng/mL (ref <=4.00)

## 2023-12-24 ENCOUNTER — Other Ambulatory Visit: Payer: Self-pay | Admitting: Nurse Practitioner

## 2023-12-24 DIAGNOSIS — I10 Essential (primary) hypertension: Secondary | ICD-10-CM

## 2023-12-26 NOTE — Telephone Encounter (Signed)
 Requested Prescriptions  Pending Prescriptions Disp Refills   lisinopril  (ZESTRIL ) 20 MG tablet [Pharmacy Med Name: Lisinopril  20 MG Oral Tablet] 90 tablet 0    Sig: TAKE 1 TABLET BY MOUTH ONCE DAILY (APPOINTMENT  REQUIRED  FOR  FUTURE  REFILLS)     Cardiovascular:  ACE Inhibitors Passed - 12/26/2023 10:01 AM      Passed - Cr in normal range and within 180 days    Creat  Date Value Ref Range Status  12/20/2023 0.91 0.70 - 1.35 mg/dL Final         Passed - K in normal range and within 180 days    Potassium  Date Value Ref Range Status  12/20/2023 5.0 3.5 - 5.3 mmol/L Final  12/18/2013 4.2 3.5 - 5.1 mmol/L Final         Passed - Patient is not pregnant      Passed - Last BP in normal range    BP Readings from Last 1 Encounters:  12/20/23 122/70         Passed - Valid encounter within last 6 months    Recent Outpatient Visits           6 days ago Mixed hyperlipidemia   [facility] Gareth Mliss [place], [place]

## 2024-01-22 ENCOUNTER — Other Ambulatory Visit: Payer: Self-pay | Admitting: Neurology

## 2024-01-22 DIAGNOSIS — G3184 Mild cognitive impairment, so stated: Secondary | ICD-10-CM

## 2024-01-22 DIAGNOSIS — R413 Other amnesia: Secondary | ICD-10-CM

## 2024-01-26 ENCOUNTER — Ambulatory Visit
Admission: RE | Admit: 2024-01-26 | Discharge: 2024-01-26 | Disposition: A | Discharge status: Home / Self Care | Source: Ambulatory Visit | Attending: Neurology | Admitting: Neurology

## 2024-01-26 DIAGNOSIS — G3184 Mild cognitive impairment, so stated: Secondary | ICD-10-CM | POA: Insufficient documentation

## 2024-01-26 DIAGNOSIS — R413 Other amnesia: Secondary | ICD-10-CM | POA: Diagnosis present

## 2024-01-28 ENCOUNTER — Ambulatory Visit: Attending: Neurology

## 2024-01-28 DIAGNOSIS — R41841 Cognitive communication deficit: Secondary | ICD-10-CM | POA: Diagnosis present

## 2024-01-28 NOTE — Therapy (Signed)
 OUTPATIENT SPEECH LANGUAGE PATHOLOGY  EVALUATION   Patient Name: Eric Rose MRN: [id number] DOB:23-Oct-1955, 68 y.o., male Today's Date: 01/28/2024  PCP: Mliss Spray, FNP REFERRING PROVIDER: Jannett Fairly, MD   End of Session - 01/28/24 1221     Visit Number 1    Number of Visits 18    Date for Recertification  04/21/24    Progress Note Due on Visit 10    SLP Start Time 1015    SLP Stop Time  1100    SLP Time Calculation (min) 45 min    Activity Tolerance Patient tolerated treatment well           Patient Active Problem List   Diagnosis Date Noted   Degeneration of lumbar intervertebral disc 10/02/2018   Obstructive sleep apnea 04/04/2018   Mixed hyperlipidemia 04/04/2018   Lump in neck 02/21/2017   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   Memory impairment 02/07/2016   Erectile dysfunction 08/08/2015   Anemia 08/06/2015   Essential hypertension, benign 08/06/2015   S/P total hip arthroplasty 04/14/2015   Elevated PSA 01/12/2015   BPH with obstruction/lower urinary tract symptoms 01/12/2015    ONSET DATE: 01/22/24 (referral date); wife reports gradual onset since before he retired (~8-10 years ago)  REFERRING DIAG: cognitive decline  THERAPY DIAG:  Cognitive communication deficit  Rationale for Evaluation and Treatment Rehabilitation  SUBJECTIVE:   SUBJECTIVE STATEMENT: Pt alert, pleasant, and cooperative.  Pt accompanied by: significant other  PERTINENT HISTORY: Pt is a 68 y.o. male who presents for cognitive-communication evaluation in setting of dementia. Per Neurology note, Mild cognitive impairment with ongoing memory issues, including repetitive questioning and misplacing items. Concerns about safety while driving due to getting lost. No significant side effects from current medications. Potential for increased dose of donepezil  to improve symptoms. Emphasized the need for caregiver support and resources.  Pt on Rivastigmine. Pt with PMHx anemia,  memory impairment, dementia, mixed hyperlipidemia. MRI brain completed 01/26/24 with results pending.   (ABNORMAL) p-tau217 - [facility] (01/21/2024 4:43 PM EST) Component Value Ref Range Test Method Analysis Time Performed At Pathologist Signature  [id number] - [facility] 0.76 (H) 0.00 - 0.18 pg/mL  01/25/2024 5:36 AM EST [facility]   Comment:    Clinical cutoff value was established using samples from a patient cohort characterized with amyloid PET data. A p-tau217 value of >0.18 is a reported surrogate marker for beta amyloid pathology, and can be used to facilitate biological identification of Alzheimer's disease (1). p-tau217 has also been used in clinical trials to monitor patients on anti-amyloid therapy (2,3).    PAIN:  Are you having pain? No   FALLS: Has patient fallen in last 6 months?  No  LIVING ENVIRONMENT: Lives with: lives with their spouse Lives in: House/apartment  PLOF:  Level of assistance: Independent with ADLs, Needed assistance with IADLS Employment: Retired   PATIENT GOALS   wife would like pt to maintain independence as much as possible  OBJECTIVE:   COGNITIVE COMMUNICATION: Overall cognitive status: Impaired Areas of impairment:  Oriented to person Attention: Impaired: Sustained, Selective, Alternating, Divided Memory: Impaired: Immediate Working Short term Long term Adult Nurse Prospective Awareness: Impaired: Intellectual Executive function: Impaired: Initiation, Problem solving, Organization, Planning, Error awareness, Self-correction, and Slow processing Behavior: Restless and   Impaired Auditory comprehension: Impaired: reduced understanding of complex auditory information Verbal expression: Impaired: reduced generative naming, wordfinding   AUDITORY COMPREHENSION: Overall auditory comprehension: Impaired: moderately complex and complex    READING COMPREHENSION: DNT; oral reading  WFL  EXPRESSION: verbal  VERBAL  EXPRESSION: Level of generative/spontaneous verbalization: word, phrase, and sentence Automatic speech: name: intact and social response: intact  Repetition: Appears intact Naming: Confrontation: 11/12 and Divergent: reduced Pragmatics: Impaired: abnormal effect Comments: laughing in effort seemingly to mask s/sx   WRITTEN EXPRESSION: Dominant hand: left   Written expression: Impaired: sentence  MOTOR SPEECH: WFL  ORAL MOTOR EXAMINATION: No functional deficits noted  STANDARDIZED ASSESSMENTS: Addenbrooke's Cognitive Examination - ACE III The Addenbrooke's Cognitive Examination-III (ACE-III) is a brief cognitive test that assesses five cognitive domains. The total score is 100 with higher scores indicating better cognitive functioning. Cut off scores of 88 and 82 are recommended for suspicion of dementia (88 has sensitivity of 1.00 and specificity of 0.96, 82 has sensitivity of 0.93 and specificity of 1.00). American Version A  Attention 5/18  Memory 7/26  Fluency 4/14  Language 21/26  Visuospatial 9/16  TOTAL ACE- III Score 46/100     PATIENT REPORTED OUTCOME MEASURES (PROM): Pt and/or wife to complete outcome measure in upcoming session   TODAY'S TREATMENT:  Pt and wife educated re: role of SLP, results of assessment, domains of cognition, and SLP POC (with emphasis on compensation and education).   PATIENT EDUCATION: Education details: as above Person educated: Patient and Spouse Education method: Explanation Education comprehension: verbalized understanding and needs further education  HOME EXERCISE PROGRAM:        To be given in upcoming sessions as appropriate    GOALS:  Goals reviewed with patient? Yes  SHORT TERM GOALS: Target date: 10 sessions  Pt and/or wife will complete outcome measure/questionnaire re: cognitive-communication in next 2 sessions.  Baseline: Goal status: INITIAL   2.  Pt and/or wife will ID x4 strategies and report successful  implementation of at least x2 strategies to promote attention/memory/executive functioning to help promote participation in ADLs/iADLs.   Baseline:  Goal status: INITIAL  3.  Pt and/or wife will verbalize understanding x3 ways to help promote cognitive-communication outside of ST. Baseline:  Goal status: INITIAL   LONG TERM GOALS: Target date: 04/22/23  Patient will demonstrate knowledge of appropriate activities to support cognitive and language function outside of ST with assistance from family.  Baseline:  Goal status: INITIAL  ASSESSMENT:  CLINICAL IMPRESSION:  Pt is a 68 y.o. male who presents for cognitive-communication evaluation in setting of dementia. Per Neurology note, Mild cognitive impairment with ongoing memory issues, including repetitive questioning and misplacing items. Concerns about safety while driving due to getting lost. No significant side effects from current medications. Potential for increased dose of donepezil  to improve symptoms. Emphasized the need for caregiver support and resources.  Wife reports pt is no longer driving, handling finances or medication, although pt stated he was unaware of this. Pt on Rivastigmine. Pt with PMHx anemia, memory impairment, dementia, mixed hyperlipidemia. MRI brain completed 01/26/24 with results pending.   Assessment today completed via informal means and standardized assessment (ACE-III). Pt presents with a moderate-severe cognitive-communication impairment with impact on all domains of cognition. Language is a relative strength for pt with mild wordfinding difficulty and difficulty with semi-complex --> complex auditory comprehension. Often, pt laughs in effort to seemingly mask s/sx. Additionally, some verbal perseveration/?rehearsed phrases noted (e.g. If you didn't ask me, I would have known... Can as I ask my assistant?). Per wife, pt's CLOF is making it challenging for him to complete simple tasks (e.g. taking out the trash).    Recommend course of ST with emphasis on education  and compensation for pt's current deficits to allow him to maintain as much independence as possible.   OBJECTIVE IMPAIRMENTS include attention, memory, awareness, executive functioning, expressive language, and receptive language. These impairments are limiting patient from managing medications, managing appointments, managing finances, household responsibilities, ADLs/IADLs, and effectively communicating at home and in community. Factors affecting potential to achieve goals and functional outcome are ability to learn/carryover information, co-morbidities, medical prognosis, and severity of impairments.. Patient will benefit from skilled SLP services to address above impairments and improve overall function.  REHAB POTENTIAL: Fair -Good; will family support  PLAN: SLP FREQUENCY: 1-2x/week  SLP DURATION: 12 weeks  PLANNED INTERVENTIONS: Cueing hierachy, Cognitive reorganization, Internal/external aids, Functional tasks, SLP instruction and feedback, Compensatory strategies, and Patient/family education    Delon Bangs, M.S., CCC-SLP Speech-Language Pathologist [facility] [phone] [place])  [facility] [address] [place], [place], [address] Phone: [phone]   Fax:  [phone]

## 2024-02-04 ENCOUNTER — Ambulatory Visit

## 2024-02-04 DIAGNOSIS — R41841 Cognitive communication deficit: Secondary | ICD-10-CM | POA: Diagnosis not present

## 2024-02-04 NOTE — Therapy (Signed)
 OUTPATIENT SPEECH LANGUAGE PATHOLOGY  TREATMENT   Patient Name: Eric Rose MRN: [id number] DOB:08/02/1955, 68 y.o., male Today's Date: 02/04/2024  PCP: Mliss Spray, FNP REFERRING PROVIDER: Jannett Fairly, MD   End of Session - 02/04/24 1003     Visit Number 2    Number of Visits 18    Date for Recertification  04/21/24    Progress Note Due on Visit 10    SLP Start Time 1010    SLP Stop Time  1055    SLP Time Calculation (min) 45 min    Activity Tolerance Patient tolerated treatment well           Patient Active Problem List   Diagnosis Date Noted   Degeneration of lumbar intervertebral disc 10/02/2018   Obstructive sleep apnea 04/04/2018   Mixed hyperlipidemia 04/04/2018   Lump in neck 02/21/2017   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   Memory impairment 02/07/2016   Erectile dysfunction 08/08/2015   Anemia 08/06/2015   Essential hypertension, benign 08/06/2015   S/P total hip arthroplasty 04/14/2015   Elevated PSA 01/12/2015   BPH with obstruction/lower urinary tract symptoms 01/12/2015    ONSET DATE: 01/22/24 (referral date); wife reports gradual onset since before he retired (~8-10 years ago)  REFERRING DIAG: cognitive decline  THERAPY DIAG:  Cognitive communication deficit  Rationale for Evaluation and Treatment Rehabilitation  SUBJECTIVE:   SUBJECTIVE STATEMENT: Pt alert, pleasant, and cooperative.  Pt accompanied by: significant other  PERTINENT HISTORY: Pt is a 68 y.o. male who presents for cognitive-communication evaluation in setting of dementia. Per Neurology note, Mild cognitive impairment with ongoing memory issues, including repetitive questioning and misplacing items. Concerns about safety while driving due to getting lost. No significant side effects from current medications. Potential for increased dose of donepezil  to improve symptoms. Emphasized the need for caregiver support and resources.  Pt on Rivastigmine. Pt with PMHx anemia,  memory impairment, dementia, mixed hyperlipidemia. MRI brain completed 01/26/24 with results pending.   (ABNORMAL) p-tau217 - [facility] (01/21/2024 4:43 PM EST) Component Value Ref Range Test Method Analysis Time Performed At Pathologist Signature  p-tau217 - [facility] 0.76 (H) 0.00 - 0.18 pg/mL  01/25/2024 5:36 AM EST [facility]   Comment:    Clinical cutoff value was established using samples from a patient cohort characterized with amyloid PET data. A p-tau217 value of >0.18 is a reported surrogate marker for beta amyloid pathology, and can be used to facilitate biological identification of Alzheimer's disease (1). p-tau217 has also been used in clinical trials to monitor patients on anti-amyloid therapy (2,3).    PAIN:  Are you having pain? No   FALLS: Has patient fallen in last 6 months?  No  LIVING ENVIRONMENT: Lives with: lives with their spouse Lives in: House/apartment  PLOF:  Level of assistance: Independent with ADLs, Needed assistance with IADLS Employment: Retired   PATIENT GOALS   wife would like pt to maintain independence as much as possible  OBJECTIVE:    TODAY'S TREATMENT:  Pt and wife educated re: results of assessment, domains of cognition, and SLP POC (with emphasis on compensation and education).   Attempted PROM (Multifactorial Memory Questionnaire with pt); however, pt with limited insight into CLOF, suspect anosognosia, as evidenced by discrepancy in wife report.  Motivational utilized to identify therapy activities/goals. With pt and wife input, identified repetitive questioning re: daily events and misplacing items as target areas for this week. Pt and wife to create a daily orientation board with DOW, date,  to do items, etc for pt to reference. SLP created label for bin for pt to place commonly misplaced items when entering the house (e.g. keys, wallet, phone). Discussed labeling look alike items that get confused (e.g. toothpaste vs denture  adhesive) and allowing pt to take a more active role in calendar management (e.g. write on calendar with wife). Wife also identified pt with difficulty sequencing steps in tasks (e.g. taking out trash). Will continue to address cognition functionally in upcoming sessions.   PATIENT EDUCATION: Education details: as above Person educated: Patient and Spouse Education method: Explanation Education comprehension: verbalized understanding and needs further education  HOME EXERCISE PROGRAM:        Start using orientation board  Create bin/space that is labeled for commonly misplaced items    GOALS:  Goals reviewed with patient? Yes  SHORT TERM GOALS: Target date: 10 sessions  Pt and/or wife will complete outcome measure/questionnaire re: cognitive-communication in next 2 sessions.  Baseline: Goal status: INITIAL   2.  Pt and/or wife will ID x4 strategies and report successful implementation of at least x2 strategies to promote attention/memory/executive functioning to help promote participation in ADLs/iADLs.   Baseline:  Goal status: INITIAL  3.  Pt and/or wife will verbalize understanding x3 ways to help promote cognitive-communication outside of ST. Baseline:  Goal status: INITIAL   LONG TERM GOALS: Target date: 04/22/23  Patient will demonstrate knowledge of appropriate activities to support cognitive and language function outside of ST with assistance from family.  Baseline:  Goal status: INITIAL  ASSESSMENT:  CLINICAL IMPRESSION:  Pt is a 68 y.o. male who presents for cognitive-communication treatment in setting of dementia. Per Neurology note, Mild cognitive impairment with ongoing memory issues, including repetitive questioning and misplacing items. Concerns about safety while driving due to getting lost. No significant side effects from current medications. Potential for increased dose of donepezil  to improve symptoms. Emphasized the need for caregiver support and  resources.  Wife reports pt is no longer driving, handling finances or medication, although pt stated he was unaware of this. Pt on Rivastigmine. Pt with PMHx anemia, memory impairment, dementia, mixed hyperlipidemia. MRI brain completed 01/26/24 1. No acute intracranial abnormality. 2. Mild chronic small vessel ischemic disease and mild cerebral atrophy.  Initial completed via informal means and standardized assessment (ACE-III). Pt presents with a moderate-severe cognitive-communication impairment with impact on all domains of cognition. Language is a relative strength for pt with mild wordfinding difficulty and difficulty with semi-complex --> complex auditory comprehension. Often, pt laughs in effort to seemingly mask s/sx. Additionally, some verbal perseveration/?rehearsed phrases noted (e.g. If you didn't ask me, I would have known... Can as I ask my assistant?). Per wife, pt's CLOF is making it challenging for him to complete simple tasks (e.g. taking out the trash).   See details of tx session above.  Recommend course of ST with emphasis on education and compensation for pt's current deficits to allow him to maintain as much independence as possible.   OBJECTIVE IMPAIRMENTS include attention, memory, awareness, executive functioning, expressive language, and receptive language. These impairments are limiting patient from managing medications, managing appointments, managing finances, household responsibilities, ADLs/IADLs, and effectively communicating at home and in community. Factors affecting potential to achieve goals and functional outcome are ability to learn/carryover information, co-morbidities, medical prognosis, and severity of impairments.. Patient will benefit from skilled SLP services to address above impairments and improve overall function.  REHAB POTENTIAL: Fair -Good; will family support  PLAN: SLP FREQUENCY: 1-2x/week  SLP DURATION: 12 weeks  PLANNED INTERVENTIONS:  Cueing hierachy, Cognitive reorganization, Internal/external aids, Functional tasks, SLP instruction and feedback, Compensatory strategies, and Patient/family education    Delon Bangs, M.S., CCC-SLP Speech-Language Pathologist [facility] [phone] [place])  [facility] at [facility] [address] [place], [place], [address] Phone: [phone]   Fax:  [phone]

## 2024-02-06 ENCOUNTER — Ambulatory Visit

## 2024-02-06 DIAGNOSIS — R41841 Cognitive communication deficit: Secondary | ICD-10-CM

## 2024-02-06 NOTE — Therapy (Signed)
 OUTPATIENT SPEECH LANGUAGE PATHOLOGY  TREATMENT   Patient Name: Eric Rose MRN: [id number] DOB:Aug 01, 1955, 68 y.o., male Today's Date: 02/06/2024  PCP: Mliss Spray, FNP REFERRING PROVIDER: Jannett Fairly, MD   End of Session - 02/06/24 1233     Visit Number 3    Number of Visits 18    Date for Recertification  04/21/24    Progress Note Due on Visit 10    SLP Start Time 1145    SLP Stop Time  1215    SLP Time Calculation (min) 30 min    Activity Tolerance Patient tolerated treatment well           Patient Active Problem List   Diagnosis Date Noted   Degeneration of lumbar intervertebral disc 10/02/2018   Obstructive sleep apnea 04/04/2018   Mixed hyperlipidemia 04/04/2018   Lump in neck 02/21/2017   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   Memory impairment 02/07/2016   Erectile dysfunction 08/08/2015   Anemia 08/06/2015   Essential hypertension, benign 08/06/2015   S/P total hip arthroplasty 04/14/2015   Elevated PSA 01/12/2015   BPH with obstruction/lower urinary tract symptoms 01/12/2015    ONSET DATE: 01/22/24 (referral date); wife reports gradual onset since before he retired (~8-10 years ago)  REFERRING DIAG: cognitive decline  THERAPY DIAG:  Cognitive communication deficit  Rationale for Evaluation and Treatment Rehabilitation  SUBJECTIVE:   SUBJECTIVE STATEMENT: Pt alert, pleasant, and cooperative.  Pt accompanied by: significant Eric  PERTINENT HISTORY: Pt is a 68 y.o. male who presents for cognitive-communication evaluation in setting of dementia. Per Neurology note, Mild cognitive impairment with ongoing memory issues, including repetitive questioning and misplacing items. Concerns about safety while driving due to getting lost. No significant side effects from current medications. Potential for increased dose of donepezil  to improve symptoms. Emphasized the need for caregiver support and resources.  Pt on Rivastigmine. Pt with PMHx anemia,  memory impairment, dementia, mixed hyperlipidemia. MRI brain completed 01/26/24 with results pending.   (ABNORMAL) p-tau217 - [facility] (01/21/2024 4:43 PM EST) Component Value Ref Range Test Method Analysis Time Performed At Pathologist Signature  p-tau217 - [facility] 0.76 (H) 0.00 - 0.18 pg/mL  01/25/2024 5:36 AM EST [facility]   Comment:    Clinical cutoff value was established using samples from a patient cohort characterized with amyloid PET data. A p-tau217 value of >0.18 is a reported surrogate marker for beta amyloid pathology, and can be used to facilitate biological identification of Alzheimer's disease (1). p-tau217 has also been used in clinical trials to monitor patients on anti-amyloid therapy (2,3).    PAIN:  Are you having pain? No   FALLS: Has patient fallen in last 6 months?  No  LIVING ENVIRONMENT: Lives with: lives with their spouse Lives in: House/apartment  PLOF:  Level of assistance: Independent with ADLs, Needed assistance with IADLS Employment: Retired   PATIENT GOALS   wife would like pt to maintain independence as much as possible  OBJECTIVE:    TODAY'S TREATMENT:  Motivational utilized to identify therapy activities/goals. With pt and wife input, identified repetitive questioning re: daily events and misplacing items as target areas for this week. Pt and wife to create a daily orientation board with DOW, date, to do items, etc for pt to reference. SLP created label for bin for pt to place commonly misplaced items when entering the house (e.g. keys, wallet, phone). Wife reports pt required max cues to utilize. Wife also identified pt with difficulty sequencing steps in tasks (  e.g. taking out trash). [place] targeted via [employer] app, pt sequenced 3-5 step iADLs with min cues. Verbally, pt required mod cues to sequence iADLs. Pt may benefit from written cues/cheat sheets for tasks around the house. Will continue to address cognition  functionally in upcoming sessions.   PATIENT EDUCATION: Education details: as above Person educated: Patient and Spouse Education method: Explanation Education comprehension: verbalized understanding and needs further education  HOME EXERCISE PROGRAM:        Start using orientation board  Create bin/space that is labeled for commonly misplaced items   Labeling trash/recycling bins    GOALS:  Goals reviewed with patient? Yes  SHORT TERM GOALS: Target date: 10 sessions  Pt and/or wife will complete outcome measure/questionnaire re: cognitive-communication in next 2 sessions.  Baseline: Goal status: INITIAL   2.  Pt and/or wife will ID x4 strategies and report successful implementation of at least x2 strategies to promote attention/memory/executive functioning to help promote participation in ADLs/iADLs.   Baseline:  Goal status: INITIAL  3.  Pt and/or wife will verbalize understanding x3 ways to help promote cognitive-communication outside of ST. Baseline:  Goal status: INITIAL   LONG TERM GOALS: Target date: 04/22/23  Patient will demonstrate knowledge of appropriate activities to support cognitive and language function outside of ST with assistance from family.  Baseline:  Goal status: INITIAL  ASSESSMENT:  CLINICAL IMPRESSION:  Pt is a 68 y.o. male who presents for cognitive-communication treatment in setting of dementia. Per Neurology note, Mild cognitive impairment with ongoing memory issues, including repetitive questioning and misplacing items. Concerns about safety while driving due to getting lost. No significant side effects from current medications. Potential for increased dose of donepezil  to improve symptoms. Emphasized the need for caregiver support and resources.  Wife reports pt is no longer driving, handling finances or medication, although pt stated he was unaware of this. Pt on Rivastigmine. Pt with PMHx anemia, memory impairment, dementia, mixed  hyperlipidemia. MRI brain completed 01/26/24 1. No acute intracranial abnormality. 2. Mild chronic small vessel ischemic disease and mild cerebral atrophy.  Initial completed via informal means and standardized assessment (ACE-III). Pt presents with a moderate-severe cognitive-communication impairment with impact on all domains of cognition. Language is a relative strength for pt with mild wordfinding difficulty and difficulty with semi-complex --> complex auditory comprehension. Often, pt laughs in effort to seemingly mask s/sx. Additionally, some verbal perseveration/?rehearsed phrases noted (e.g. If you didn't ask me, I would have known... Can as I ask my assistant?). Per wife, pt's CLOF is making it challenging for him to complete simple tasks (e.g. taking out the trash).   See details of tx session above.  Recommend course of ST with emphasis on education and compensation for pt's current deficits to allow him to maintain as much independence as possible.   OBJECTIVE IMPAIRMENTS include attention, memory, awareness, executive functioning, expressive language, and receptive language. These impairments are limiting patient from managing medications, managing appointments, managing finances, household responsibilities, ADLs/IADLs, and effectively communicating at home and in community. Factors affecting potential to achieve goals and functional outcome are ability to learn/carryover information, co-morbidities, medical prognosis, and severity of impairments.. Patient will benefit from skilled SLP services to address above impairments and improve overall function.  REHAB POTENTIAL: Fair -Good; will family support  PLAN: SLP FREQUENCY: 1-2x/week  SLP DURATION: 12 weeks  PLANNED INTERVENTIONS: Cueing hierachy, Cognitive reorganization, Internal/external aids, Functional tasks, SLP instruction and feedback, Compensatory strategies, and Patient/family education    Delon Bangs,  M.S.,  CCC-SLP Speech-Language Pathologist [facility] [facility] [phone] [place])  [facility] Outpatient Rehabilitation at [facility] [address] [place], [place], [address] Phone: [phone]   Fax:  [phone]

## 2024-02-10 ENCOUNTER — Other Ambulatory Visit: Payer: Self-pay | Admitting: Nurse Practitioner

## 2024-02-10 DIAGNOSIS — E782 Mixed hyperlipidemia: Secondary | ICD-10-CM

## 2024-02-12 ENCOUNTER — Ambulatory Visit

## 2024-02-12 DIAGNOSIS — R41841 Cognitive communication deficit: Secondary | ICD-10-CM | POA: Diagnosis present

## 2024-02-12 NOTE — Therapy (Signed)
 OUTPATIENT SPEECH LANGUAGE PATHOLOGY  TREATMENT   Patient Name: Eric Rose MRN: [id number] DOB:[birth year], 68 y.o., male Today's Date: 02/12/2024  PCP: Mliss Spray, FNP REFERRING PROVIDER: Jannett Fairly, MD   End of Session - 02/12/24 1102     Visit Number 4    Number of Visits 18    Date for Recertification  04/21/24    Progress Note Due on Visit 10    SLP Start Time 1102    SLP Stop Time  1150    SLP Time Calculation (min) 48 min    Activity Tolerance Patient tolerated treatment well           Patient Active Problem List   Diagnosis Date Noted   Degeneration of lumbar intervertebral disc 10/02/2018   Obstructive sleep apnea 04/04/2018   Mixed hyperlipidemia 04/04/2018   Lump in neck 02/21/2017   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   Memory impairment 02/07/2016   Erectile dysfunction 08/08/2015   Anemia 08/06/2015   Essential hypertension, benign 08/06/2015   S/P total hip arthroplasty 04/14/2015   Elevated PSA 01/12/2015   BPH with obstruction/lower urinary tract symptoms 01/12/2015    ONSET DATE: 01/22/24 (referral date); wife reports gradual onset since before he retired (~8-10 years ago)  REFERRING DIAG: cognitive decline  THERAPY DIAG:  Cognitive communication deficit  Rationale for Evaluation and Treatment Rehabilitation  SUBJECTIVE:   SUBJECTIVE STATEMENT: Pt alert, pleasant, and cooperative.  Pt accompanied by: significant other  PERTINENT HISTORY: Pt is a 68 y.o. male who presents for cognitive-communication evaluation in setting of dementia. Per Neurology note, Mild cognitive impairment with ongoing memory issues, including repetitive questioning and misplacing items. Concerns about safety while driving due to getting lost. No significant side effects from current medications. Potential for increased dose of donepezil  to improve symptoms. Emphasized the need for caregiver support and resources.  Pt on Rivastigmine. Pt with PMHx anemia,  memory impairment, dementia, mixed hyperlipidemia. MRI brain completed 01/26/24 with results pending.   (ABNORMAL) p-tau217 - [facility] (01/21/2024 4:43 PM EST) Component Value Ref Range Test Method Analysis Time Performed At Pathologist Signature  p-tau217 - [facility] 0.76 (H) 0.00 - 0.18 pg/mL  01/25/2024 5:36 AM EST [facility]   Comment:    Clinical cutoff value was established using samples from a patient cohort characterized with amyloid PET data. A p-tau217 value of >0.18 is a reported surrogate marker for beta amyloid pathology, and can be used to facilitate biological identification of Alzheimer's disease (1). p-tau217 has also been used in clinical trials to monitor patients on anti-amyloid therapy (2,3).    PAIN:  Are you having pain? No   FALLS: Has patient fallen in last 6 months?  No  LIVING ENVIRONMENT: Lives with: lives with their spouse Lives in: House/apartment  PLOF:  Level of assistance: Independent with ADLs, Needed assistance with IADLS Employment: Retired   PATIENT GOALS   wife would like pt to maintain independence as much as possible  OBJECTIVE:    TODAY'S TREATMENT:  Reviewed attention/memory strategies with pt and wife. Pt and wife to create a daily orientation board with DOW, date, to do items, etc for pt to reference. SLP created label for bin for pt to place commonly misplaced items when entering the house (e.g. keys, wallet, phone). Wife reports pt required max cues to utilize. Wife reports improved success with taking out trash with labeled bins. Reviewed pt benefits from explicit verbal and written cues. Discussed ways to promote cognition (e.g. mental stimulation, social  interaction, physical activity). Discussed importance of daily cognitive promoting activities. At wife request, information provided re: adult day care, senior services. Based on motivational interviewing, no further targets identified by pt/wife. Discussed progress toward goals  and SLP POC. Pt and wife agree to d/c ST at this time.    PATIENT EDUCATION: Education details: as above Person educated: Patient and Spouse Education method: Explanation; Handout Education comprehension: verbalized understanding and needs further education  HOME EXERCISE PROGRAM:        Continue strategies as above    GOALS:  Goals reviewed with patient? Yes  SHORT TERM GOALS: Target date: 10 sessions  Pt and/or wife will complete outcome measure/questionnaire re: cognitive-communication in next 2 sessions.  Baseline: Goal status: adequate for d/c   2.  Pt and/or wife will ID x4 strategies and report successful implementation of at least x2 strategies to promote attention/memory/executive functioning to help promote participation in ADLs/iADLs.   Baseline:  Goal status: MET  3.  Pt and/or wife will verbalize understanding x3 ways to help promote cognitive-communication outside of ST. Baseline:  Goal status: MET   LONG TERM GOALS: Target date: 04/22/23  Patient will demonstrate knowledge of appropriate activities to support cognitive and language function outside of ST with assistance from family.  Baseline:  Goal status: MET ASSESSMENT:  CLINICAL IMPRESSION:  Pt is a 68 y.o. male who presents for cognitive-communication treatment in setting of dementia. Per Neurology note, Mild cognitive impairment with ongoing memory issues, including repetitive questioning and misplacing items. Concerns about safety while driving due to getting lost. No significant side effects from current medications. Potential for increased dose of donepezil  to improve symptoms. Emphasized the need for caregiver support and resources.  Wife reports pt is no longer driving, handling finances or medication, although pt stated he was unaware of this. Pt on Rivastigmine. Pt with PMHx anemia, memory impairment, dementia, mixed hyperlipidemia. MRI brain completed 01/26/24 1. No acute intracranial  abnormality. 2. Mild chronic small vessel ischemic disease and mild cerebral atrophy.  Initial completed via informal means and standardized assessment (ACE-III). Pt presents with a moderate-severe cognitive-communication impairment with impact on all domains of cognition. Language is a relative strength for pt with mild wordfinding difficulty and difficulty with semi-complex --> complex auditory comprehension. Often, pt laughs in effort to seemingly mask s/sx. Additionally, some verbal perseveration/?rehearsed phrases noted (e.g. If you didn't ask me, I would have known... Can as I ask my assistant?). Per wife, pt's CLOF is making it challenging for him to complete simple tasks (e.g. taking out the trash).   See details of tx session above.  Based on motivational interviewing, no further targets identified by pt/wife. Pt and wife agree to d/c ST at this time.   OBJECTIVE IMPAIRMENTS include attention, memory, awareness, executive functioning, expressive language, and receptive language. These impairments are limiting patient from managing medications, managing appointments, managing finances, household responsibilities, ADLs/IADLs, and effectively communicating at home and in community. Factors affecting potential to achieve goals and functional outcome are ability to learn/carryover information, co-morbidities, medical prognosis, and severity of impairments.. Patient will benefit from skilled SLP services to address above impairments and improve overall function.  REHAB POTENTIAL: Fair -Good; will family support  PLAN: D/C ST    Delon Bangs, M.S., CCC-SLP Speech-Language Pathologist [place] [facility] [phone] [place])  [facility] Outpatient Rehabilitation at [facility] [address] [place], [place], [address] Phone: [phone]   Fax:  [phone]

## 2024-02-13 NOTE — Telephone Encounter (Signed)
 Requested Prescriptions  Pending Prescriptions Disp Refills   pravastatin  (PRAVACHOL ) 20 MG tablet [Pharmacy Med Name: Pravastatin  Sodium 20 MG Oral Tablet] 90 tablet 0    Sig: TAKE 1 TABLET BY MOUTH AT BEDTIME     Cardiovascular:  Antilipid - Statins Failed - 02/13/2024  3:10 PM      Failed - Lipid Panel in normal range within the last 12 months    Cholesterol, Total  Date Value Ref Range Status  08/06/2015 168 100 - 199 mg/dL Final   Cholesterol  Date Value Ref Range Status  12/20/2023 140 <200 mg/dL Final   LDL Cholesterol (Calc)  Date Value Ref Range Status  12/20/2023 70 mg/dL (calc) Final    Comment:    Reference range: <100 . Desirable range <100 mg/dL for primary prevention;   <70 mg/dL for patients with CHD or diabetic patients  with > or = 2 CHD risk factors. SABRA LDL-C is now calculated using the Martin-Hopkins  calculation, which is a validated novel method providing  better accuracy than the Friedewald equation in the  estimation of LDL-C.  Gladis APPLETHWAITE et al. SANDREA. 7986;689(80): 2061-2068  (http://education.[employer].com/faq/FAQ164)    HDL  Date Value Ref Range Status  12/20/2023 56 > OR = 40 mg/dL Final  94/73/7982 60 >60 mg/dL Final   Triglycerides  Date Value Ref Range Status  12/20/2023 59 <150 mg/dL Final         Passed - Patient is not pregnant      Passed - Valid encounter within last 12 months    Recent Outpatient Visits           1 month ago Mixed hyperlipidemia   [facility] Gareth Mliss [place], [place]

## 2024-02-14 ENCOUNTER — Other Ambulatory Visit: Payer: Self-pay | Admitting: Nurse Practitioner

## 2024-02-14 ENCOUNTER — Ambulatory Visit

## 2024-02-18 NOTE — Telephone Encounter (Signed)
 Requested Prescriptions  Pending Prescriptions Disp Refills   memantine  (NAMENDA ) 5 MG tablet [Pharmacy Med Name: Memantine  HCl 5 MG Oral Tablet] 180 tablet 1    Sig: Take 1 tablet by mouth twice daily     Neurology:  Alzheimer's Agents 2 Passed - 02/18/2024  8:53 AM      Passed - Cr in normal range and within 360 days    Creat  Date Value Ref Range Status  12/20/2023 0.91 0.70 - 1.35 mg/dL Final         Passed - eGFR is 5 or above and within 360 days    GFR, Est African American  Date Value Ref Range Status  04/07/2019 104 > OR = 60 mL/min/1.53m2 Final   GFR calc Af Amer  Date Value Ref Range Status  11/26/2019 >60 >60 mL/min Final   GFR, Est Non African American  Date Value Ref Range Status  04/07/2019 89 > OR = 60 mL/min/1.11m2 Final   GFR calc non Af Amer  Date Value Ref Range Status  11/26/2019 >60 >60 mL/min Final   eGFR  Date Value Ref Range Status  12/20/2023 91 > OR = 60 mL/min/1.68m2 Final         Passed - Valid encounter within last 6 months    Recent Outpatient Visits           2 months ago Mixed hyperlipidemia   [facility] Gareth Mliss [place], [place]

## 2024-02-19 ENCOUNTER — Ambulatory Visit

## 2024-02-21 ENCOUNTER — Ambulatory Visit

## 2024-02-25 ENCOUNTER — Ambulatory Visit

## 2024-02-28 ENCOUNTER — Ambulatory Visit

## 2024-03-04 ENCOUNTER — Ambulatory Visit

## 2024-03-12 ENCOUNTER — Ambulatory Visit

## 2024-03-17 ENCOUNTER — Other Ambulatory Visit: Payer: Self-pay | Admitting: Nurse Practitioner

## 2024-03-17 DIAGNOSIS — I10 Essential (primary) hypertension: Secondary | ICD-10-CM

## 2024-03-18 ENCOUNTER — Ambulatory Visit

## 2024-03-18 NOTE — Telephone Encounter (Signed)
 Requested Prescriptions  Pending Prescriptions Disp Refills   lisinopril  (ZESTRIL ) 20 MG tablet [Pharmacy Med Name: Lisinopril  20 MG Oral Tablet] 90 tablet 0    Sig: TAKE 1 TABLET BY MOUTH ONCE DAILY . APPOINTMENT REQUIRED FOR FUTURE REFILLS     Cardiovascular:  ACE Inhibitors Passed - 03/18/2024  4:57 PM      Passed - Cr in normal range and within 180 days    Creat  Date Value Ref Range Status  12/20/2023 0.91 0.70 - 1.35 mg/dL Final         Passed - K in normal range and within 180 days    Potassium  Date Value Ref Range Status  12/20/2023 5.0 3.5 - 5.3 mmol/L Final  12/18/2013 4.2 3.5 - 5.1 mmol/L Final         Passed - Patient is not pregnant      Passed - Last BP in normal range    BP Readings from Last 1 Encounters:  12/20/23 122/70         Passed - Valid encounter within last 6 months    Recent Outpatient Visits           2 months ago Mixed hyperlipidemia   [facility] Gareth Mliss [place], [place]

## 2024-03-20 ENCOUNTER — Ambulatory Visit

## 2024-03-25 ENCOUNTER — Ambulatory Visit

## 2024-03-27 ENCOUNTER — Ambulatory Visit

## 2024-04-01 ENCOUNTER — Ambulatory Visit

## 2024-04-03 ENCOUNTER — Ambulatory Visit

## 2024-04-08 ENCOUNTER — Ambulatory Visit

## 2024-04-10 ENCOUNTER — Ambulatory Visit

## 2024-04-25 ENCOUNTER — Ambulatory Visit

## 2024-06-19 ENCOUNTER — Ambulatory Visit: Admitting: Nurse Practitioner
